# Patient Record
Sex: Female | Born: 1970
Health system: Southern US, Community
[De-identification: ages and names within clinical notes are randomized; demographics above are authoritative.]

## PROBLEM LIST (undated history)

## (undated) DIAGNOSIS — R590 Localized enlarged lymph nodes: Secondary | ICD-10-CM

## (undated) DIAGNOSIS — I10 Essential (primary) hypertension: Secondary | ICD-10-CM

## (undated) DIAGNOSIS — J439 Emphysema, unspecified: Secondary | ICD-10-CM

## (undated) DIAGNOSIS — E119 Type 2 diabetes mellitus without complications: Secondary | ICD-10-CM

## (undated) DIAGNOSIS — R918 Other nonspecific abnormal finding of lung field: Secondary | ICD-10-CM

## (undated) DIAGNOSIS — F329 Major depressive disorder, single episode, unspecified: Secondary | ICD-10-CM

## (undated) DIAGNOSIS — G8929 Other chronic pain: Secondary | ICD-10-CM

## (undated) DIAGNOSIS — F32A Depression, unspecified: Secondary | ICD-10-CM

## (undated) DIAGNOSIS — F17200 Nicotine dependence, unspecified, uncomplicated: Secondary | ICD-10-CM

## (undated) HISTORY — PX: WISDOM TOOTH EXTRACTION: SHX21

## (undated) HISTORY — PX: HIP CLOSED REDUCTION: SHX983

## (undated) HISTORY — PX: CHOLECYSTECTOMY: SHX55

---
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
## 2013-07-20 DIAGNOSIS — K8010 Calculus of gallbladder with chronic cholecystitis without obstruction: Secondary | ICD-10-CM | POA: Insufficient documentation

## 2013-11-20 DIAGNOSIS — M25559 Pain in unspecified hip: Secondary | ICD-10-CM | POA: Insufficient documentation

## 2014-02-08 DIAGNOSIS — S32402A Unspecified fracture of left acetabulum, initial encounter for closed fracture: Secondary | ICD-10-CM | POA: Insufficient documentation

## 2014-06-24 DIAGNOSIS — M1652 Unilateral post-traumatic osteoarthritis, left hip: Secondary | ICD-10-CM | POA: Insufficient documentation

## 2016-04-09 DIAGNOSIS — J189 Pneumonia, unspecified organism: Secondary | ICD-10-CM

## 2016-04-09 HISTORY — DX: Pneumonia, unspecified organism: J18.9

## 2017-12-25 ENCOUNTER — Emergency Department (HOSPITAL_BASED_OUTPATIENT_CLINIC_OR_DEPARTMENT_OTHER): Payer: Self-pay

## 2017-12-25 ENCOUNTER — Other Ambulatory Visit: Payer: Self-pay

## 2017-12-25 ENCOUNTER — Emergency Department (HOSPITAL_BASED_OUTPATIENT_CLINIC_OR_DEPARTMENT_OTHER)
Admission: EM | Admit: 2017-12-25 | Discharge: 2017-12-25 | Disposition: A | Discharge status: Home / Self Care | Payer: Self-pay | Attending: Emergency Medicine | Admitting: Emergency Medicine

## 2017-12-25 ENCOUNTER — Encounter (HOSPITAL_BASED_OUTPATIENT_CLINIC_OR_DEPARTMENT_OTHER): Payer: Self-pay | Admitting: *Deleted

## 2017-12-25 DIAGNOSIS — M25552 Pain in left hip: Secondary | ICD-10-CM | POA: Insufficient documentation

## 2017-12-25 DIAGNOSIS — G8929 Other chronic pain: Secondary | ICD-10-CM | POA: Insufficient documentation

## 2017-12-25 DIAGNOSIS — E119 Type 2 diabetes mellitus without complications: Secondary | ICD-10-CM | POA: Insufficient documentation

## 2017-12-25 DIAGNOSIS — F17210 Nicotine dependence, cigarettes, uncomplicated: Secondary | ICD-10-CM | POA: Insufficient documentation

## 2017-12-25 DIAGNOSIS — M545 Low back pain: Secondary | ICD-10-CM | POA: Insufficient documentation

## 2017-12-25 DIAGNOSIS — Z79899 Other long term (current) drug therapy: Secondary | ICD-10-CM | POA: Insufficient documentation

## 2017-12-25 HISTORY — DX: Type 2 diabetes mellitus without complications: E11.9

## 2017-12-25 HISTORY — DX: Other chronic pain: G89.29

## 2017-12-25 LAB — PREGNANCY, URINE: Preg Test, Ur: NEGATIVE

## 2017-12-25 MED ORDER — TRAMADOL HCL 50 MG PO TABS: 50.0000 mg | ORAL_TABLET | Freq: Two times a day (BID) | ORAL | 0 refills | Status: DC | PRN

## 2017-12-25 MED ORDER — HYDROMORPHONE HCL 1 MG/ML IJ SOLN
1.0000 mg | Freq: Once | INTRAMUSCULAR | Status: AC
  Administered 2017-12-25: 1 mg via INTRAMUSCULAR
  Filled 2017-12-25: qty 1

## 2017-12-25 MED ORDER — HYDROCODONE-ACETAMINOPHEN 5-325 MG PO TABS
2.0000 | ORAL_TABLET | Freq: Once | ORAL | Status: AC
  Administered 2017-12-25: 2 via ORAL
  Filled 2017-12-25: qty 2

## 2017-12-25 MED ORDER — KETOROLAC TROMETHAMINE 60 MG/2ML IM SOLN
60.0000 mg | Freq: Once | INTRAMUSCULAR | Status: AC
  Administered 2017-12-25: 60 mg via INTRAMUSCULAR
  Filled 2017-12-25: qty 2

## 2017-12-25 MED ORDER — FENTANYL CITRATE (PF) 100 MCG/2ML IJ SOLN
50.0000 ug | Freq: Once | INTRAMUSCULAR | Status: AC
  Administered 2017-12-25: 50 ug via INTRAMUSCULAR
  Filled 2017-12-25: qty 2

## 2017-12-25 MED ORDER — METHOCARBAMOL 500 MG PO TABS: 500.0000 mg | ORAL_TABLET | Freq: Two times a day (BID) | ORAL | 0 refills | Status: DC

## 2017-12-25 NOTE — ED Notes (Signed)
Patient transported to CT 

## 2017-12-25 NOTE — Discharge Instructions (Signed)
As discussed, you need to follow-up with referred orthopedic doctor for further evaluation.  You can take Tylenol or Ibuprofen as directed for pain. You can alternate Tylenol and Ibuprofen every 4 hours. If you take Tylenol at 1pm, then you can take Ibuprofen at 5pm. Then you can take Tylenol again at 9pm.   You can take the tramadol for severe breakthrough pain.  Take Robaxin as prescribed. This medication will make you drowsy so do not drive or drink alcohol when taking it.  Return the emergency department for worsening pain, numbness/weakness of the leg, difficulty walking, fevers, redness or swelling of the leg or any other worsening or concerning symptoms.

## 2017-12-25 NOTE — ED Notes (Signed)
ED Provider at bedside. 

## 2017-12-25 NOTE — ED Triage Notes (Signed)
Pt c/o lower left back and hip pain which radiates  down left leg  X 1 day, HX chronic left hip pain
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
## 2017-12-31 ENCOUNTER — Ambulatory Visit (INDEPENDENT_AMBULATORY_CARE_PROVIDER_SITE_OTHER): Payer: Self-pay | Admitting: Family Medicine

## 2017-12-31 ENCOUNTER — Encounter: Payer: Self-pay | Admitting: Family Medicine

## 2017-12-31 VITALS — BP 139/89 | HR 89 | Ht 60.0 in | Wt 150.0 lb

## 2017-12-31 DIAGNOSIS — M79605 Pain in left leg: Secondary | ICD-10-CM

## 2017-12-31 DIAGNOSIS — M545 Low back pain: Secondary | ICD-10-CM

## 2017-12-31 MED ORDER — HYDROCODONE-ACETAMINOPHEN 5-325 MG PO TABS: 1.0000 | ORAL_TABLET | ORAL | 0 refills | Status: DC | PRN

## 2017-12-31 MED ORDER — TIZANIDINE HCL 4 MG PO TABS: 4.0000 mg | ORAL_TABLET | Freq: Four times a day (QID) | ORAL | 1 refills | Status: DC | PRN

## 2017-12-31 MED ORDER — PREDNISONE 10 MG PO TABS: ORAL_TABLET | ORAL | 0 refills | Status: DC

## 2017-12-31 NOTE — Patient Instructions (Signed)
You have lumbar radiculopathy (a pinched nerve in your low back) along with your hip arthritis, nonunion of pelvic fracture A prednisone dose pack is the best option for immediate relief and may be prescribed. Day after finishing prednisone ok to take aleve twice a day with food for pain and inflammation. Norco as needed for severe pain (no driving on this medicine). Tizanidine as needed for muscle spasms (no driving on this medicine if it makes you sleepy). Stay as active as possible. Strengthening of low back muscles, abdominal musculature are key for long term pain relief. If not improving, will consider further imaging (MRI). Follow up with me in 1-2 weeks.

## 2018-01-01 ENCOUNTER — Encounter: Payer: Self-pay | Admitting: Family Medicine
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
## 2018-01-09 ENCOUNTER — Ambulatory Visit: Payer: Self-pay | Admitting: Family Medicine

## 2018-01-10 ENCOUNTER — Ambulatory Visit: Payer: Self-pay | Admitting: Family Medicine

## 2018-04-29 ENCOUNTER — Encounter (HOSPITAL_BASED_OUTPATIENT_CLINIC_OR_DEPARTMENT_OTHER): Payer: Self-pay | Admitting: Emergency Medicine

## 2018-04-29 ENCOUNTER — Other Ambulatory Visit: Payer: Self-pay

## 2018-04-29 ENCOUNTER — Emergency Department (HOSPITAL_BASED_OUTPATIENT_CLINIC_OR_DEPARTMENT_OTHER)
Admission: EM | Admit: 2018-04-29 | Discharge: 2018-04-29 | Disposition: A | Discharge status: Home / Self Care | Payer: Self-pay | Attending: Emergency Medicine | Admitting: Emergency Medicine

## 2018-04-29 ENCOUNTER — Emergency Department (HOSPITAL_BASED_OUTPATIENT_CLINIC_OR_DEPARTMENT_OTHER): Payer: Self-pay

## 2018-04-29 DIAGNOSIS — R05 Cough: Secondary | ICD-10-CM | POA: Insufficient documentation

## 2018-04-29 DIAGNOSIS — E119 Type 2 diabetes mellitus without complications: Secondary | ICD-10-CM | POA: Insufficient documentation

## 2018-04-29 DIAGNOSIS — F17210 Nicotine dependence, cigarettes, uncomplicated: Secondary | ICD-10-CM | POA: Insufficient documentation

## 2018-04-29 DIAGNOSIS — R059 Cough, unspecified: Secondary | ICD-10-CM

## 2018-04-29 DIAGNOSIS — J069 Acute upper respiratory infection, unspecified: Secondary | ICD-10-CM | POA: Insufficient documentation

## 2018-04-29 MED ORDER — BENZONATATE 100 MG PO CAPS: 100.0000 mg | ORAL_CAPSULE | Freq: Three times a day (TID) | ORAL | 0 refills | Status: DC | PRN

## 2018-04-29 MED ORDER — ALBUTEROL SULFATE HFA 108 (90 BASE) MCG/ACT IN AERS
2.0000 | INHALATION_SPRAY | Freq: Once | RESPIRATORY_TRACT | Status: AC
  Administered 2018-04-29: 2 via RESPIRATORY_TRACT
  Filled 2018-04-29: qty 6.7

## 2018-04-29 MED ORDER — DOXYCYCLINE HYCLATE 100 MG PO CAPS: 100.0000 mg | ORAL_CAPSULE | Freq: Two times a day (BID) | ORAL | 0 refills | Status: AC

## 2018-04-29 MED FILL — BENZONATATE 100 MG CAP: 100 | 7 days supply | Qty: 21 | Fill #0

## 2018-04-29 MED FILL — DOXYCYCLINE HYCLATE 100 MG: 100 | 7 days supply | Qty: 14 | Fill #0

## 2018-04-29 NOTE — ED Provider Notes (Signed)
Emergency Department Provider Note   I have reviewed the triage vital signs and the nursing notes.   HISTORY  Chief Complaint Generalized Body Aches and Cough   HPI Marcia King is a 48 y.o. female with PMH of DM and chronic left hip pain presents to the emergency department for evaluation of cough, congestion, headache, body aches over the past 9 days.  Patient states that symptoms began as relatively mild and have progressively worsened over the past several days.  She denies any vomiting or diarrhea.  She has had persistent cough and states that the cough is causing pain in her left hip, which always hurts.  She denies any fever.  She did start taking a friend's amoxicillin over the past 4 to 5 days.  She is a smoker but has not been able to smoke over the past week due to symptoms.  No abdominal pain.  No radiation of symptoms or other modifying factors.  Past Medical History:  Diagnosis Date  . Chronic pain   . Diabetes mellitus without complication [facility])     Patient Active Problem List   Diagnosis Date Noted  . Post-traumatic osteoarthritis of left hip 06/24/2014  . Closed fracture of left acetabulum ([place]) 02/08/2014  . Arthralgia of hip 11/20/2013  . Chronic cholecystitis with calculus 07/20/2013    Past Surgical History:  Procedure Laterality Date  . CHOLECYSTECTOMY    . HIP CLOSED REDUCTION      Allergies Patient has no known allergies.  History reviewed. No pertinent family history.  Social History Social History   Tobacco Use  . Smoking status: Current Every Day Smoker    Packs/day: 0.50    Types: Cigarettes  . Smokeless tobacco: Never Used  Substance Use Topics  . Alcohol use: Not Currently  . Drug use: Not Currently    Review of Systems  Constitutional: No fever/chills. Positive body aches.  Eyes: No visual changes. ENT: No sore throat. Positive congestion.  Cardiovascular: Denies chest pain. Respiratory: Positive shortness of breath and  cough.  Gastrointestinal: No abdominal pain.  No nausea, no vomiting.  No diarrhea.  No constipation. Genitourinary: Negative for dysuria. Musculoskeletal: Negative for back pain. Skin: Negative for rash. Neurological: Positive mild HA.   10-point ROS otherwise negative.  ____________________________________________   PHYSICAL EXAM:  VITAL SIGNS: ED Triage Vitals  Enc Vitals Group     BP 04/29/18 0804 (!) 166/107     Pulse Rate 04/29/18 0804 (!) 105     Resp 04/29/18 0804 20     Temp 04/29/18 0804 98.7 F (37.1 C)     Temp Source 04/29/18 0804 Oral     SpO2 04/29/18 0804 95 %     Weight 04/29/18 0805 150 lb (68 kg)     Height 04/29/18 0805 5' (1.524 m)     Pain Score 04/29/18 0804 7   Constitutional: Alert and oriented. Well appearing and in no acute distress. Eyes: Conjunctivae are normal. Head: Atraumatic. Ears:  Healthy appearing ear canals and TMs bilaterally Nose: No congestion/rhinnorhea. Mouth/Throat: Mucous membranes are moist.  Oropharynx non-erythematous. Neck: No stridor.   Cardiovascular: Tachycardia. Good peripheral circulation. Grossly normal heart sounds.   Respiratory: Normal respiratory effort.  No retractions. Lungs CTAB. Intermittent bronchospastic cough noted.  Gastrointestinal: No distention.  Musculoskeletal: No lower extremity tenderness nor edema. Neurologic:  Normal speech and language.  Skin:  Skin is warm, dry and intact.   ____________________________________________  RADIOLOGY  Dg Chest 2 View  Result Date: 04/29/2018  CLINICAL DATA:  Cough, headache EXAM: CHEST - 2 VIEW COMPARISON:  None. FINDINGS: Increased interstitial markings in the right upper lobe, lingula, and bilateral lower lobes. Subpleural/basilar predominance. Although chronic interstitial lung disease is possible, multifocal infection can not be excluded in the absence of prior imaging. No pleural effusion or pneumothorax. The heart is normal in size. Visualized osseous  structures are within normal limits. 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 Acute infection/pneumonia is not excluded. Electronically Signed   By: Julian Hy M.D.   On: 04/29/2018 08:37    ____________________________________________   PROCEDURES  Procedure(s) performed:   Procedures  None ____________________________________________   INITIAL IMPRESSION / ASSESSMENT AND PLAN / ED COURSE  Pertinent labs & imaging results that were available during my care of the patient were reviewed by me and considered in my medical decision making (see chart for details).  Patient presents to the emergency department with flulike symptoms over the past 9 days.  She has been self treating at home with a friend's amoxicillin over the past 4 to 5 days.  No hypoxemia or focal lung findings.  She does have a bronchospastic cough and long smoking history.  Low suspicion for pneumonia but given 9 days of symptoms I do plan for chest x-ray.  Afebrile here.  Frequently coughing.  Plan for albuterol inhaler for home use.  Advised against continuing her empiric amoxicillin.  I also counseled the patient on smoking cessation as she has not smoked in the past week.  Patient will consider cutting back or quitting altogether.   08:40 AM Chest x-ray reviewed with normal infiltrate at the bases.  Could be related to longstanding smoking but in the setting of flulike symptoms I will cover with Doxy for possible pneumonia.  Patient provided an albuterol inhaler in the emergency department.  Also prescribed Tessalon for cough suppression.  Contact information for primary care physician provided.  [facility] emergency department return precautions. ____________________________________________  FINAL CLINICAL IMPRESSION(S) / ED DIAGNOSES  Final diagnoses:  Upper respiratory tract infection, unspecified type  Cough     MEDICATIONS GIVEN DURING THIS VISIT:  Medications    albuterol (PROVENTIL HFA;VENTOLIN HFA) 108 (90 Base) MCG/ACT inhaler 2 puff (2 puffs Inhalation Given 04/29/18 0818)     NEW OUTPATIENT MEDICATIONS STARTED DURING THIS VISIT:  New Prescriptions   BENZONATATE (TESSALON) 100 MG CAPSULE    Take 1 capsule (100 mg total) by mouth 3 (three) times daily as needed for cough.   DOXYCYCLINE (VIBRAMYCIN) 100 MG CAPSULE    Take 1 capsule (100 mg total) by mouth 2 (two) times daily for 7 days.    Note:  This document was prepared using Dragon voice recognition software and may include unintentional dictation errors.  Nanda Quinton, MD Emergency Medicine    Long, Wonda Olds, MD 04/29/18 [phone]

## 2018-04-29 NOTE — ED Triage Notes (Signed)
Reports cough since last Tuesday.  Additionally endorses generalized body aches.  Reports taking a previous RX she had for amoxicillin since Thursday.

## 2018-04-29 NOTE — Discharge Instructions (Signed)
We believe that your symptoms are caused today by pneumonia, an infection in your lung(s).  Fortunately you should start to improve quickly after taking your antibiotics.  Please take the full course of antibiotics as prescribed and drink plenty of fluids.    Follow up with your doctor within 1-2 days.  If you develop any new or worsening symptoms, including but not limited to fever in spite of taking over-the-counter ibuprofen and/or Tylenol, persistent vomiting, worsening shortness of breath, or other symptoms that concern you, please return to the Emergency Department immediately.

## 2018-05-21 ENCOUNTER — Other Ambulatory Visit: Payer: Self-pay

## 2018-05-21 ENCOUNTER — Emergency Department (HOSPITAL_BASED_OUTPATIENT_CLINIC_OR_DEPARTMENT_OTHER): Payer: Self-pay

## 2018-05-21 ENCOUNTER — Encounter (HOSPITAL_BASED_OUTPATIENT_CLINIC_OR_DEPARTMENT_OTHER): Payer: Self-pay | Admitting: Emergency Medicine

## 2018-05-21 ENCOUNTER — Emergency Department (HOSPITAL_BASED_OUTPATIENT_CLINIC_OR_DEPARTMENT_OTHER)
Admission: EM | Admit: 2018-05-21 | Discharge: 2018-05-21 | Disposition: A | Discharge status: Home / Self Care | Payer: Self-pay | Attending: Emergency Medicine | Admitting: Emergency Medicine

## 2018-05-21 DIAGNOSIS — Z79899 Other long term (current) drug therapy: Secondary | ICD-10-CM | POA: Insufficient documentation

## 2018-05-21 DIAGNOSIS — N3000 Acute cystitis without hematuria: Secondary | ICD-10-CM | POA: Insufficient documentation

## 2018-05-21 DIAGNOSIS — F17210 Nicotine dependence, cigarettes, uncomplicated: Secondary | ICD-10-CM | POA: Insufficient documentation

## 2018-05-21 DIAGNOSIS — J069 Acute upper respiratory infection, unspecified: Secondary | ICD-10-CM | POA: Insufficient documentation

## 2018-05-21 DIAGNOSIS — R059 Cough, unspecified: Secondary | ICD-10-CM

## 2018-05-21 DIAGNOSIS — E119 Type 2 diabetes mellitus without complications: Secondary | ICD-10-CM | POA: Insufficient documentation

## 2018-05-21 DIAGNOSIS — R05 Cough: Secondary | ICD-10-CM

## 2018-05-21 DIAGNOSIS — R52 Pain, unspecified: Secondary | ICD-10-CM

## 2018-05-21 DIAGNOSIS — R5383 Other fatigue: Secondary | ICD-10-CM | POA: Insufficient documentation

## 2018-05-21 DIAGNOSIS — R509 Fever, unspecified: Secondary | ICD-10-CM | POA: Insufficient documentation

## 2018-05-21 DIAGNOSIS — R0602 Shortness of breath: Secondary | ICD-10-CM | POA: Insufficient documentation

## 2018-05-21 LAB — CBC WITH DIFFERENTIAL/PLATELET
Abs Immature Granulocytes: 0.01 10*3/uL (ref 0.00–0.07)
BASOS ABS: 0 10*3/uL (ref 0.0–0.1)
Basophils Relative: 1 %
EOS PCT: 4 %
Eosinophils Absolute: 0.1 10*3/uL (ref 0.0–0.5)
HEMATOCRIT: 44.8 % (ref 36.0–46.0)
HEMOGLOBIN: 14.8 g/dL (ref 12.0–15.0)
Immature Granulocytes: 0 %
LYMPHS PCT: 36 %
Lymphs Abs: 1.2 10*3/uL (ref 0.7–4.0)
MCH: 28.7 pg (ref 26.0–34.0)
MCHC: 33 g/dL (ref 30.0–36.0)
MCV: 86.8 fL (ref 80.0–100.0)
MONO ABS: 0.3 10*3/uL (ref 0.1–1.0)
Monocytes Relative: 8 %
NRBC: 0 % (ref 0.0–0.2)
Neutro Abs: 1.7 10*3/uL (ref 1.7–7.7)
Neutrophils Relative %: 51 %
PLATELETS: 194 10*3/uL (ref 150–400)
RBC: 5.16 MIL/uL — AB (ref 3.87–5.11)
RDW: 13.1 % (ref 11.5–15.5)
WBC: 3.3 10*3/uL — AB (ref 4.0–10.5)

## 2018-05-21 LAB — COMPREHENSIVE METABOLIC PANEL
ALT: 63 U/L — AB (ref 0–44)
AST: 57 U/L — AB (ref 15–41)
Albumin: 3.9 g/dL (ref 3.5–5.0)
Alkaline Phosphatase: 73 U/L (ref 38–126)
Anion gap: 8 (ref 5–15)
BUN: 15 mg/dL (ref 6–20)
CHLORIDE: 107 mmol/L (ref 98–111)
CO2: 22 mmol/L (ref 22–32)
CREATININE: 0.79 mg/dL (ref 0.44–1.00)
Calcium: 9.1 mg/dL (ref 8.9–10.3)
GFR calc Af Amer: >60 mL/min (ref >60)
GLUCOSE: 98 mg/dL (ref 70–99)
Potassium: 3.8 mmol/L (ref 3.5–5.1)
Sodium: 137 mmol/L (ref 135–145)
Total Bilirubin: 0.3 mg/dL (ref 0.3–1.2)
Total Protein: 7.9 g/dL (ref 6.5–8.1)

## 2018-05-21 LAB — URINALYSIS, ROUTINE W REFLEX MICROSCOPIC
Bilirubin Urine: NEGATIVE
GLUCOSE, UA: NEGATIVE mg/dL
HGB URINE DIPSTICK: NEGATIVE
Ketones, ur: NEGATIVE mg/dL
Nitrite: NEGATIVE
PH: 6 (ref 5.0–8.0)
Protein, ur: NEGATIVE mg/dL
SPECIFIC GRAVITY, URINE: 1.025 (ref 1.005–1.030)

## 2018-05-21 LAB — BRAIN NATRIURETIC PEPTIDE: B Natriuretic Peptide: 12 pg/mL (ref 0.0–100.0)

## 2018-05-21 LAB — TROPONIN I: Troponin I: <0.03 ng/mL (ref <0.03)

## 2018-05-21 LAB — URINALYSIS, MICROSCOPIC (REFLEX)

## 2018-05-21 LAB — LIPASE, BLOOD: LIPASE: 36 U/L (ref 11–51)

## 2018-05-21 MED ORDER — IPRATROPIUM-ALBUTEROL 0.5-2.5 (3) MG/3ML IN SOLN
3.0000 mL | Freq: Four times a day (QID) | RESPIRATORY_TRACT | Status: DC
  Administered 2018-05-21: 3 mL via RESPIRATORY_TRACT
  Filled 2018-05-21: qty 3

## 2018-05-21 MED ORDER — CEPHALEXIN 250 MG PO CAPS
500.0000 mg | ORAL_CAPSULE | Freq: Once | ORAL | Status: AC
  Administered 2018-05-21: 500 mg via ORAL
  Filled 2018-05-21: qty 2

## 2018-05-21 MED ORDER — BENZONATATE 100 MG PO CAPS: 100.0000 mg | ORAL_CAPSULE | Freq: Three times a day (TID) | ORAL | 0 refills | Status: DC

## 2018-05-21 MED ORDER — BENZONATATE 100 MG PO CAPS
100.0000 mg | ORAL_CAPSULE | Freq: Once | ORAL | Status: AC
  Administered 2018-05-21: 100 mg via ORAL
  Filled 2018-05-21: qty 1

## 2018-05-21 MED ORDER — CEPHALEXIN 500 MG PO CAPS: 500.0000 mg | ORAL_CAPSULE | Freq: Two times a day (BID) | ORAL | 0 refills | Status: AC

## 2018-05-21 MED ORDER — PREDNISONE 50 MG PO TABS: 50.0000 mg | ORAL_TABLET | Freq: Every day | ORAL | 0 refills | Status: AC

## 2018-05-21 MED ORDER — SODIUM CHLORIDE 0.9 % IV BOLUS
1000.0000 mL | Freq: Once | INTRAVENOUS | Status: AC
  Administered 2018-05-21: 1000 mL via INTRAVENOUS

## 2018-05-21 MED ORDER — PREDNISONE 50 MG PO TABS
50.0000 mg | ORAL_TABLET | Freq: Once | ORAL | Status: AC
  Administered 2018-05-21: 50 mg via ORAL
  Filled 2018-05-21: qty 1

## 2018-05-21 MED FILL — predniSONE 50 MG TABS: 50 | 4 days supply | Qty: 4 | Fill #0

## 2018-05-21 MED FILL — BENZONATATE 100 MG CAP: 100 | 7 days supply | Qty: 21 | Fill #0

## 2018-05-21 MED FILL — CEPHALEXIN 500 MG CAPSULE: 500 | 7 days supply | Qty: 14 | Fill #0

## 2018-05-21 NOTE — ED Triage Notes (Signed)
Reports continued generalized body aches, weakness, and fatigue.  Reports fevers.  Additionally c/o productive cough.

## 2018-05-21 NOTE — Discharge Instructions (Signed)
Your work-up today showed no evidence of pneumonia but we did find evidence of urinary tract infection.  Please use the antibiotics for the next week to treat this.  Please use the cough medicine and steroids to help with your breathing.  Please use the albuterol inhaler you are given last time.  Please follow-up with your primary doctor in the neck several days.  If any symptoms change or worsen, please return to the nearest emergency department.

## 2018-05-21 NOTE — ED Provider Notes (Signed)
[facility] EMERGENCY DEPARTMENT Provider Note   CSN: [id number] Arrival date & time: 05/21/18  3710     History   Chief Complaint Chief Complaint  Patient presents with  . Generalized Body Aches    HPI Marcia King is a 48 y.o. female.  The history is provided by the patient and medical records. No language interpreter was used.  URI  Presenting symptoms: congestion, cough, fatigue, fever and rhinorrhea   Severity:  Moderate Onset quality:  Gradual Duration:  6 days Timing:  Intermittent Progression:  Waxing and waning Chronicity:  Recurrent Relieved by:  Nothing Worsened by:  Nothing Ineffective treatments:  OTC medications Associated symptoms: wheezing   Associated symptoms: no neck pain   Risk factors: diabetes mellitus     Past Medical History:  Diagnosis Date  . Chronic pain   . Diabetes mellitus without complication [facility])     Patient Active Problem List   Diagnosis Date Noted  . Post-traumatic osteoarthritis of left hip 06/24/2014  . Closed fracture of left acetabulum ([place]) 02/08/2014  . Arthralgia of hip 11/20/2013  . Chronic cholecystitis with calculus 07/20/2013    Past Surgical History:  Procedure Laterality Date  . CHOLECYSTECTOMY    . HIP CLOSED REDUCTION       OB History   No obstetric history on file.      Home Medications    Prior to Admission medications   Medication Sig Start Date End Date Taking? Authorizing Provider  albuterol (PROVENTIL HFA;VENTOLIN HFA) 108 (90 Base) MCG/ACT inhaler Inhale into the lungs. 12/27/16   [provider]  benzonatate (TESSALON) 100 MG capsule Take 1 capsule (100 mg total) by mouth 3 (three) times daily as needed for cough. 04/29/18   Long, Wonda Olds, MD  Calcium Carb-Cholecalciferol (CALCIUM-VITAMIN D) 500-200 MG-UNIT tablet Take by mouth.    [provider]  HYDROcodone-acetaminophen (NORCO) 5-325 MG tablet Take 1 tablet by mouth every 4 (four) hours as needed for moderate  pain. 12/31/17   Hudnall, Sharyn Lull, MD  predniSONE (DELTASONE) 10 MG tablet 6 tabs po day 1, 5 tabs po day 2, 4 tabs po day 3, 3 tabs po day 4, 2 tabs po day 5, 1 tab po day 6 12/31/17   Hudnall, Sharyn Lull, MD  tiZANidine (ZANAFLEX) 4 MG tablet Take 1 tablet (4 mg total) by mouth every 6 (six) hours as needed. 12/31/17   Dene Gentry, MD    Family History History reviewed. No pertinent family history.  Social History Social History   Tobacco Use  . Smoking status: Current Every Day Smoker    Packs/day: 0.50    Types: Cigarettes  . Smokeless tobacco: Never Used  Substance Use Topics  . Alcohol use: Not Currently  . Drug use: Not Currently     Allergies   Patient has no known allergies.   Review of Systems Review of Systems  Constitutional: Positive for chills, fatigue and fever. Negative for diaphoresis.  HENT: Positive for congestion and rhinorrhea.   Eyes: Negative for visual disturbance.  Respiratory: Positive for cough, shortness of breath and wheezing. Negative for chest tightness.   Cardiovascular: Negative for chest pain, palpitations and leg swelling.  Gastrointestinal: Positive for diarrhea. Negative for abdominal pain, constipation, nausea and vomiting.  Genitourinary: Negative for dysuria, flank pain and frequency.  Musculoskeletal: Negative for back pain, neck pain and neck stiffness.  Skin: Negative for rash and wound.  Neurological: Negative for dizziness, weakness, light-headedness and numbness.  Psychiatric/Behavioral: Negative  for agitation and confusion.  All other systems reviewed and are negative.    Physical Exam Updated Vital Signs BP (!) 150/107 (BP Location: Right Arm)   Pulse (!) 110   Temp 98.4 F (36.9 C) (Oral)   Resp 18   Ht 5' (1.524 m)   Wt 68 kg   SpO2 93%   BMI 29.29 kg/m   Physical Exam Vitals signs and nursing note reviewed.  Constitutional:      General: She is not in acute distress.    Appearance: She is well-developed. She  is not ill-appearing, toxic-appearing or diaphoretic.  HENT:     Head: Normocephalic and atraumatic.     Nose: Congestion and rhinorrhea present.     Mouth/Throat:     Mouth: Mucous membranes are dry.     Pharynx: No oropharyngeal exudate or posterior oropharyngeal erythema.  Eyes:     Extraocular Movements: Extraocular movements intact.     Conjunctiva/sclera: Conjunctivae normal.     Pupils: Pupils are equal, round, and reactive to light.  Neck:     Musculoskeletal: Neck supple.  Cardiovascular:     Rate and Rhythm: Regular rhythm. Tachycardia present.     Pulses: Normal pulses.     Heart sounds: No murmur.  Pulmonary:     Effort: Pulmonary effort is normal. No respiratory distress.     Breath sounds: No stridor. Wheezing, rhonchi and rales present.  Chest:     Chest wall: Tenderness present.  Abdominal:     General: Abdomen is flat. There is no distension.     Palpations: Abdomen is soft.     Tenderness: There is no abdominal tenderness. There is no right CVA tenderness, left CVA tenderness or guarding.  Musculoskeletal:        General: No tenderness.     Right lower leg: No edema.     Left lower leg: No edema.  Skin:    General: Skin is warm and dry.     Capillary Refill: Capillary refill takes less than 2 seconds.     Findings: No erythema.  Neurological:     General: No focal deficit present.     Mental Status: She is alert.  Psychiatric:        Mood and Affect: Mood normal.      ED Treatments / Results  Labs (all labs ordered are listed, but only abnormal results are displayed) Labs Reviewed  CBC WITH DIFFERENTIAL/PLATELET - Abnormal; Notable for the following components:      Result Value   WBC 3.3 (*)    RBC 5.16 (*)    All other components within normal limits  COMPREHENSIVE METABOLIC PANEL - Abnormal; Notable for the following components:   AST 57 (*)    ALT 63 (*)    All other components within normal limits  URINALYSIS, ROUTINE W REFLEX MICROSCOPIC  - Abnormal; Notable for the following components:   APPearance CLOUDY (*)    Leukocytes,Ua SMALL (*)    All other components within normal limits  URINALYSIS, MICROSCOPIC (REFLEX) - Abnormal; Notable for the following components:   Bacteria, UA MANY (*)    All other components within normal limits  URINE CULTURE  LIPASE, BLOOD  BRAIN NATRIURETIC PEPTIDE  TROPONIN I    EKG EKG Interpretation  Date/Time:  Wednesday May 21 2018 08:19:15 EST Ventricular Rate:  93 PR Interval:    QRS Duration: 85 QT Interval:  368 QTC Calculation: 458 R Axis:   -10 Text Interpretation:  Sinus  rhythm No prior ECG for comparison.  No STEMI Confirmed by Antony Blackbird [phone]) on 05/21/2018 8:27:39 AM Also confirmed by Antony Blackbird [phone]), editor Philomena Doheny [phone])  on 05/21/2018 11:36:44 AM   Radiology Dg Chest 2 View  Result Date: 05/21/2018 CLINICAL DATA:  Cough EXAM: CHEST - 2 VIEW COMPARISON:  April 29, 2018 FINDINGS: There are areas of scarring in each mid lung region and to a lesser degree in the right upper lobe. There is no appreciable edema or consolidation. The heart size and pulmonary vascularity are normal. No adenopathy. No bone lesions. IMPRESSION: Scattered areas of scarring. No edema or consolidation. Stable cardiac silhouette. No evident adenopathy. Electronically Signed   By: Lowella Grip III M.D.   On: 05/21/2018 08:31    Procedures Procedures (including critical care time)  Medications Ordered in ED Medications  ipratropium-albuterol (DUONEB) 0.5-2.5 (3) MG/3ML nebulizer solution 3 mL (3 mLs Nebulization Given 05/21/18 0821)  cephALEXin (KEFLEX) capsule 500 mg (has no administration in time range)  benzonatate (TESSALON) capsule 100 mg (has no administration in time range)  predniSONE (DELTASONE) tablet 50 mg (has no administration in time range)  sodium chloride 0.9 % bolus 1,000 mL (0 mLs Intravenous Stopped 05/21/18 0915)     Initial Impression / Assessment  and Plan / ED Course  I have reviewed the triage vital signs and the nursing notes.  Pertinent labs & imaging results that were available during my care of the patient were reviewed by me and considered in my medical decision making (see chart for details).     Marcia King is a 47 y.o. female with a past medical history significant for chronic left hip pain after hip replacement, diabetes, prior cholecystectomy, and recent URI who presents with fevers, chills, congestion, productive cough, myalgias, diarrhea, and fatigue.  Patient reports that last month she had a viral URI and had several days where she was doing better but then last Friday started having worsened symptoms.  She reports her temperature was 101 on Friday and has had intermittent fevers and chills since.  She reports she is having cough and is having shortness of breath.  She denies significant chest pain.  She denies abdominal pain.  She does report diffuse myalgias.  She reports no leg pain or leg swelling specifically.  She reports no urinary symptoms but does report onset of diarrhea.  She denies any blood in her diarrhea.  She denies any neck pain or neck stiffness specifically.  No neurologic deficits.  She denies other complaints.  On exam, patient has rhonchi and crackles in the bases of her lungs.  She also has some mild diffuse wheezing.  She will be given a breathing treatment.  Chest was tender.  Abdomen was nontender.  Normal sensation in extremities.  Normal pulses in extremities.  No significant lower extremity edema seen.  Patient is tachycardic on arrival.  Likely aspect patient is slightly dehydrated from reported decreased appetite and her diarrhea.  Patient also likely has viral infection given her diffuse symptoms.  However, due to the crackles in her lungs, will have chest x-ray to look for pneumonia and a BNP look for fluid overload.  We will also obtain troponin to look for some form of viral myocarditis causing  the crackles.  Patient will be given fluids and a breathing treatment.  Anticipate reassessment after work-up and medications.  12:09 PM Work-up returned overall reassuring.  Troponin negative, doubt myocarditis.  Urinalysis does have leukocytes and bacteria.  Patient reports  that she is having decreased urine which she was not presenting with earlier.  Patient was treated for UTI.  Patient requested a cough medicine only on Tessalon.  She had improvement in her wheezing but was still having small wheezing.  Suspect undiagnosed underlying asthma or COPD.  Patient given a burst of steroids with a dose of prednisone today.  Patient agreed with PCP follow-up and return precautions.  X-ray did not show pneumonia.  Patient understands plan of care and discharged in good condition with likely viral URI as well as UTI.     Final Clinical Impressions(s) / ED Diagnoses   Final diagnoses:  Viral upper respiratory tract infection  Cough  Body aches  Fatigue, unspecified type  Acute cystitis without hematuria    ED Discharge Orders         Ordered    predniSONE (DELTASONE) 50 MG tablet  Daily     05/21/18 1212    benzonatate (TESSALON) 100 MG capsule  Every 8 hours     05/21/18 1212    cephALEXin (KEFLEX) 500 MG capsule  2 times daily     05/21/18 1212          Clinical Impression: 1. Viral upper respiratory tract infection   2. Cough   3. Body aches   4. Fatigue, unspecified type   5. Acute cystitis without hematuria     Disposition: Discharge  Condition: Good  I have discussed the results, Dx and Tx plan with the pt(& family if present). He/she/they expressed understanding and agree(s) with the plan. Discharge instructions discussed at great length. Strict return precautions discussed and pt &/or family have verbalized understanding of the instructions. No further questions at time of discharge.    New Prescriptions   BENZONATATE (TESSALON) 100 MG CAPSULE    Take 1 capsule (100  mg total) by mouth every 8 (eight) hours.   CEPHALEXIN (KEFLEX) 500 MG CAPSULE    Take 1 capsule (500 mg total) by mouth 2 (two) times daily for 7 days.   PREDNISONE (DELTASONE) 50 MG TABLET    Take 1 tablet (50 mg total) by mouth daily for 4 days.    Follow Up: [facility] [place] [id number] [phone] Schedule an appointment as soon as possible for a visit    [facility] [address] [id number] [place] [phone]       Tegeler, Gwenyth Allegra, MD 05/21/18 1214

## 2018-05-22 LAB — URINE CULTURE: CULTURE: NO GROWTH

## 2018-09-08 ENCOUNTER — Encounter (HOSPITAL_BASED_OUTPATIENT_CLINIC_OR_DEPARTMENT_OTHER): Payer: Self-pay | Admitting: *Deleted

## 2018-09-08 ENCOUNTER — Emergency Department (HOSPITAL_BASED_OUTPATIENT_CLINIC_OR_DEPARTMENT_OTHER): Payer: Self-pay

## 2018-09-08 ENCOUNTER — Other Ambulatory Visit: Payer: Self-pay

## 2018-09-08 ENCOUNTER — Emergency Department (HOSPITAL_BASED_OUTPATIENT_CLINIC_OR_DEPARTMENT_OTHER)
Admission: EM | Admit: 2018-09-08 | Discharge: 2018-09-08 | Disposition: A | Discharge status: Home / Self Care | Payer: Self-pay | Attending: Emergency Medicine | Admitting: Emergency Medicine

## 2018-09-08 DIAGNOSIS — E119 Type 2 diabetes mellitus without complications: Secondary | ICD-10-CM | POA: Insufficient documentation

## 2018-09-08 DIAGNOSIS — S20211A Contusion of right front wall of thorax, initial encounter: Secondary | ICD-10-CM | POA: Insufficient documentation

## 2018-09-08 DIAGNOSIS — Y92009 Unspecified place in unspecified non-institutional (private) residence as the place of occurrence of the external cause: Secondary | ICD-10-CM | POA: Insufficient documentation

## 2018-09-08 DIAGNOSIS — W19XXXA Unspecified fall, initial encounter: Secondary | ICD-10-CM

## 2018-09-08 DIAGNOSIS — F17210 Nicotine dependence, cigarettes, uncomplicated: Secondary | ICD-10-CM | POA: Insufficient documentation

## 2018-09-08 DIAGNOSIS — W010XXA Fall on same level from slipping, tripping and stumbling without subsequent striking against object, initial encounter: Secondary | ICD-10-CM | POA: Insufficient documentation

## 2018-09-08 DIAGNOSIS — Y9389 Activity, other specified: Secondary | ICD-10-CM | POA: Insufficient documentation

## 2018-09-08 DIAGNOSIS — Z79899 Other long term (current) drug therapy: Secondary | ICD-10-CM | POA: Insufficient documentation

## 2018-09-08 DIAGNOSIS — S20212A Contusion of left front wall of thorax, initial encounter: Secondary | ICD-10-CM

## 2018-09-08 DIAGNOSIS — S7002XA Contusion of left hip, initial encounter: Secondary | ICD-10-CM | POA: Insufficient documentation

## 2018-09-08 DIAGNOSIS — Y999 Unspecified external cause status: Secondary | ICD-10-CM | POA: Insufficient documentation

## 2018-09-08 MED ORDER — HYDROCODONE-ACETAMINOPHEN 5-325 MG PO TABS
2.0000 | ORAL_TABLET | Freq: Once | ORAL | Status: AC
  Administered 2018-09-08: 2 via ORAL
  Filled 2018-09-08: qty 2

## 2018-09-08 MED ORDER — HYDROCODONE-ACETAMINOPHEN 5-325 MG PO TABS: 1.0000 | ORAL_TABLET | Freq: Four times a day (QID) | ORAL | 0 refills | Status: DC | PRN

## 2018-09-08 NOTE — ED Notes (Signed)
ED Provider at bedside. 
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
## 2018-09-08 NOTE — ED Notes (Signed)
Patient transported to X-ray 
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
## 2018-09-08 NOTE — Discharge Instructions (Signed)
In addition to pain meds you can take ibuprofen as well.

## 2018-10-22 ENCOUNTER — Emergency Department (HOSPITAL_BASED_OUTPATIENT_CLINIC_OR_DEPARTMENT_OTHER)
Admission: EM | Admit: 2018-10-22 | Discharge: 2018-10-22 | Disposition: A | Discharge status: Home / Self Care | Payer: Self-pay | Attending: Emergency Medicine | Admitting: Emergency Medicine

## 2018-10-22 ENCOUNTER — Encounter (HOSPITAL_BASED_OUTPATIENT_CLINIC_OR_DEPARTMENT_OTHER): Payer: Self-pay

## 2018-10-22 ENCOUNTER — Emergency Department (HOSPITAL_BASED_OUTPATIENT_CLINIC_OR_DEPARTMENT_OTHER): Payer: Self-pay

## 2018-10-22 ENCOUNTER — Other Ambulatory Visit: Payer: Self-pay

## 2018-10-22 DIAGNOSIS — S8392XA Sprain of unspecified site of left knee, initial encounter: Secondary | ICD-10-CM | POA: Insufficient documentation

## 2018-10-22 DIAGNOSIS — E119 Type 2 diabetes mellitus without complications: Secondary | ICD-10-CM | POA: Insufficient documentation

## 2018-10-22 DIAGNOSIS — X500XXA Overexertion from strenuous movement or load, initial encounter: Secondary | ICD-10-CM | POA: Insufficient documentation

## 2018-10-22 DIAGNOSIS — Y9389 Activity, other specified: Secondary | ICD-10-CM | POA: Insufficient documentation

## 2018-10-22 DIAGNOSIS — Z79899 Other long term (current) drug therapy: Secondary | ICD-10-CM | POA: Insufficient documentation

## 2018-10-22 DIAGNOSIS — Y999 Unspecified external cause status: Secondary | ICD-10-CM | POA: Insufficient documentation

## 2018-10-22 DIAGNOSIS — F17210 Nicotine dependence, cigarettes, uncomplicated: Secondary | ICD-10-CM | POA: Insufficient documentation

## 2018-10-22 DIAGNOSIS — Y929 Unspecified place or not applicable: Secondary | ICD-10-CM | POA: Insufficient documentation

## 2018-10-22 MED ORDER — IBUPROFEN 800 MG PO TABS: 800.0000 mg | ORAL_TABLET | Freq: Three times a day (TID) | ORAL | 0 refills | Status: AC

## 2018-10-22 MED ORDER — ACETAMINOPHEN 325 MG PO TABS
650.0000 mg | ORAL_TABLET | Freq: Once | ORAL | Status: AC
  Administered 2018-10-22: 650 mg via ORAL
  Filled 2018-10-22: qty 2

## 2018-10-22 MED ORDER — IBUPROFEN 800 MG PO TABS
800.0000 mg | ORAL_TABLET | Freq: Once | ORAL | Status: AC
  Administered 2018-10-22: 21:00:00 800 mg via ORAL
  Filled 2018-10-22: qty 1

## 2018-10-22 NOTE — Discharge Instructions (Signed)
Take Motrin and Tylenol as needed as directed. Use immobilizer and crutches, weight bear as tolerated. Apply ice and elevate knee for 30 minutes three times daily.  Follow up with orthopedics, referral given.

## 2018-10-22 NOTE — ED Provider Notes (Signed)
[facility] EMERGENCY DEPARTMENT Provider Note   CSN: [id number] Arrival date & time: 10/22/18  1914    History   Chief Complaint Chief Complaint  Patient presents with  . Knee Pain    HPI Marcia King is a 48 y.o. female.     48 year old female presents with complaint of left knee injury.  Patient states that she was walking up the stairs when her knee twisted and buckled.  Patient reports pain to the anterior left knee, unable to flex her knee secondary to pain.  Patient noticed bruising to the front of her knee which concerned her and prompted her visit today.  Patient denies any prior injuries to her knee, denies prior locking or clicking.  Patient is unable to bear weight, has crutches with her today.  No other injuries, complaints or concerns.     Past Medical History:  Diagnosis Date  . Chronic pain   . Diabetes mellitus without complication [facility])     Patient Active Problem List   Diagnosis Date Noted  . Post-traumatic osteoarthritis of left hip 06/24/2014  . Closed fracture of left acetabulum ([place]) 02/08/2014  . Arthralgia of hip 11/20/2013  . Chronic cholecystitis with calculus 07/20/2013    Past Surgical History:  Procedure Laterality Date  . CHOLECYSTECTOMY    . HIP CLOSED REDUCTION       OB History   No obstetric history on file.      Home Medications    Prior to Admission medications   Medication Sig Start Date End Date Taking? Authorizing Provider  albuterol (PROVENTIL HFA;VENTOLIN HFA) 108 (90 Base) MCG/ACT inhaler Inhale into the lungs. 12/27/16   [provider]  benzonatate (TESSALON) 100 MG capsule Take 1 capsule (100 mg total) by mouth 3 (three) times daily as needed for cough. 04/29/18   Long, Wonda Olds, MD  benzonatate (TESSALON) 100 MG capsule Take 1 capsule (100 mg total) by mouth every 8 (eight) hours. 05/21/18   Tegeler, Gwenyth Allegra, MD  Calcium Carb-Cholecalciferol (CALCIUM-VITAMIN D) 500-200 MG-UNIT tablet Take by  mouth.    [provider]  HYDROcodone-acetaminophen (NORCO/VICODIN) 5-325 MG tablet Take 1-2 tablets by mouth every 6 (six) hours as needed. 09/08/18   Blanchie Dessert, MD  ibuprofen (ADVIL) 800 MG tablet Take 1 tablet (800 mg total) by mouth 3 (three) times daily. 10/22/18   Tacy Learn, PA-C  predniSONE (DELTASONE) 10 MG tablet 6 tabs po day 1, 5 tabs po day 2, 4 tabs po day 3, 3 tabs po day 4, 2 tabs po day 5, 1 tab po day 6 12/31/17   Hudnall, Sharyn Lull, MD  tiZANidine (ZANAFLEX) 4 MG tablet Take 1 tablet (4 mg total) by mouth every 6 (six) hours as needed. 12/31/17   Dene Gentry, MD    Family History No family history on file.  Social History Social History   Tobacco Use  . Smoking status: Current Every Day Smoker    Packs/day: 0.50    Types: Cigarettes  . Smokeless tobacco: Never Used  Substance Use Topics  . Alcohol use: Not Currently  . Drug use: Not Currently     Allergies   Patient has no known allergies.   Review of Systems Review of Systems  Constitutional: Negative for fever.  Musculoskeletal: Positive for arthralgias, gait problem and joint swelling.  Skin: Positive for color change. Negative for rash and wound.  Allergic/Immunologic: Negative for immunocompromised state.  Neurological: Negative for weakness and numbness.  Hematological: Does  not bruise/bleed easily.  All other systems reviewed and are negative.    Physical Exam Updated Vital Signs BP (!) 153/95 (BP Location: Right Arm)   Pulse (!) 104   Temp 98.5 F (36.9 C) (Oral)   Resp 18   Ht 5' (1.524 m)   Wt 70.3 kg   SpO2 98%   BMI 30.27 kg/m   Physical Exam Vitals signs and nursing note reviewed.  Constitutional:      General: She is not in acute distress.    Appearance: She is well-developed. She is not diaphoretic.  HENT:     Head: Normocephalic and atraumatic.  Cardiovascular:     Pulses: Normal pulses.  Pulmonary:     Effort: Pulmonary effort is normal.   Musculoskeletal:        General: Swelling, tenderness and signs of injury present. No deformity.     Left hip: Normal.     Right knee: Normal.     Left knee: She exhibits decreased range of motion, swelling and ecchymosis. She exhibits no laceration and no erythema. Tenderness found.     Left ankle: Normal.       Legs:  Skin:    General: Skin is warm and dry.     Capillary Refill: Capillary refill takes less than 2 seconds.     Findings: No erythema or rash.  Neurological:     Mental Status: She is alert and oriented to person, place, and time.  Psychiatric:        Behavior: Behavior normal.      ED Treatments / Results  Labs (all labs ordered are listed, but only abnormal results are displayed) Labs Reviewed - No data to display  EKG None  Radiology Dg Knee Complete 4 Views Left  Result Date: 10/22/2018 CLINICAL DATA:  Left knee pain status post buckling today, severe pain and swelling with inability to bear weight EXAM: LEFT KNEE - COMPLETE 4+ VIEW COMPARISON:  None. FINDINGS: Trace left knee joint effusion without evidence of acute fracture or traumatic malalignment. Minimal tricompartmental degenerative changes are present. Soft tissues are otherwise unremarkable. IMPRESSION: Trace left knee joint effusion. No acute osseous abnormality. Electronically Signed   By: Lovena Le M.D.   On: 10/22/2018 20:42    Procedures Procedures (including critical care time)  Medications Ordered in ED Medications  ibuprofen (ADVIL) tablet 800 mg (has no administration in time range)  acetaminophen (TYLENOL) tablet 650 mg (has no administration in time range)     Initial Impression / Assessment and Plan / ED Course  I have reviewed the triage vital signs and the nursing notes.  Pertinent labs & imaging results that were available during my care of the patient were reviewed by me and considered in my medical decision making (see chart for details).  Clinical Course as of Oct 21 2112  Wed Oct 22, 6231  1912 48 year old female with left knee injury.  Pain to the anterior lateral left knee just distal to the patella with ecchymosis and swelling.  X-ray positive for small effusion, negative for bony abnormality.  Discussed with patient possible sprain versus meniscus injury.  Patient was placed in a knee immobilizer, ambulatory with her crutches to weight-bear as tolerated.  Recommend ice, elevation, Motrin and Tylenol.  Patient referred to sports medicine for recheck.   [LM]    Clinical Course User Index [LM] Tacy Learn, PA-C      Final Clinical Impressions(s) / ED Diagnoses   Final diagnoses:  Sprain  of left knee, unspecified ligament, initial encounter    ED Discharge Orders         Ordered    ibuprofen (ADVIL) 800 MG tablet  3 times daily     10/22/18 2107           Roque Lias 10/22/18 2114    Dorie Rank, MD 10/23/18 [phone]
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
## 2019-01-08 DIAGNOSIS — C801 Malignant (primary) neoplasm, unspecified: Secondary | ICD-10-CM

## 2019-01-08 HISTORY — DX: Malignant (primary) neoplasm, unspecified: C80.1

## 2019-01-27 ENCOUNTER — Other Ambulatory Visit: Payer: Self-pay

## 2019-01-27 ENCOUNTER — Emergency Department (HOSPITAL_BASED_OUTPATIENT_CLINIC_OR_DEPARTMENT_OTHER)
Admission: EM | Admit: 2019-01-27 | Discharge: 2019-01-27 | Disposition: A | Discharge status: Home / Self Care | Payer: Medicaid Other | Attending: Emergency Medicine | Admitting: Emergency Medicine

## 2019-01-27 ENCOUNTER — Encounter (HOSPITAL_BASED_OUTPATIENT_CLINIC_OR_DEPARTMENT_OTHER): Payer: Self-pay | Admitting: *Deleted

## 2019-01-27 ENCOUNTER — Emergency Department (HOSPITAL_BASED_OUTPATIENT_CLINIC_OR_DEPARTMENT_OTHER): Payer: Medicaid Other

## 2019-01-27 DIAGNOSIS — R918 Other nonspecific abnormal finding of lung field: Secondary | ICD-10-CM | POA: Diagnosis not present

## 2019-01-27 DIAGNOSIS — Z87891 Personal history of nicotine dependence: Secondary | ICD-10-CM | POA: Insufficient documentation

## 2019-01-27 DIAGNOSIS — R072 Precordial pain: Secondary | ICD-10-CM | POA: Diagnosis present

## 2019-01-27 LAB — TROPONIN I (HIGH SENSITIVITY): Troponin I (High Sensitivity): 2 ng/L (ref <18)

## 2019-01-27 LAB — CBC WITH DIFFERENTIAL/PLATELET
Abs Immature Granulocytes: 0.03 10*3/uL (ref 0.00–0.07)
Basophils Absolute: 0.1 10*3/uL (ref 0.0–0.1)
Basophils Relative: 1 %
Eosinophils Absolute: 0.2 10*3/uL (ref 0.0–0.5)
Eosinophils Relative: 2 %
HCT: 42.2 % (ref 36.0–46.0)
Hemoglobin: 14.1 g/dL (ref 12.0–15.0)
Immature Granulocytes: 0 %
Lymphocytes Relative: 14 %
Lymphs Abs: 1.2 10*3/uL (ref 0.7–4.0)
MCH: 29.1 pg (ref 26.0–34.0)
MCHC: 33.4 g/dL (ref 30.0–36.0)
MCV: 87 fL (ref 80.0–100.0)
Monocytes Absolute: 0.5 10*3/uL (ref 0.1–1.0)
Monocytes Relative: 5 %
Neutro Abs: 6.6 10*3/uL (ref 1.7–7.7)
Neutrophils Relative %: 78 %
Platelets: 255 10*3/uL (ref 150–400)
RBC: 4.85 MIL/uL (ref 3.87–5.11)
RDW: 13.2 % (ref 11.5–15.5)
WBC: 8.5 10*3/uL (ref 4.0–10.5)
nRBC: 0 % (ref 0.0–0.2)

## 2019-01-27 LAB — COMPREHENSIVE METABOLIC PANEL
ALT: 21 U/L (ref 0–44)
AST: 20 U/L (ref 15–41)
Albumin: 4.3 g/dL (ref 3.5–5.0)
Alkaline Phosphatase: 90 U/L (ref 38–126)
Anion gap: 12 (ref 5–15)
BUN: 20 mg/dL (ref 6–20)
CO2: 22 mmol/L (ref 22–32)
Calcium: 9.6 mg/dL (ref 8.9–10.3)
Chloride: 101 mmol/L (ref 98–111)
Creatinine, Ser: 0.94 mg/dL (ref 0.44–1.00)
GFR calc Af Amer: >60 mL/min (ref >60)
GFR calc non Af Amer: >60 mL/min (ref >60)
Glucose, Bld: 96 mg/dL (ref 70–99)
Potassium: 3.9 mmol/L (ref 3.5–5.1)
Sodium: 135 mmol/L (ref 135–145)
Total Bilirubin: 0.5 mg/dL (ref 0.3–1.2)
Total Protein: 9.4 g/dL — ABNORMAL HIGH (ref 6.5–8.1)

## 2019-01-27 LAB — D-DIMER, QUANTITATIVE: D-Dimer, Quant: 1.61 ug/mL-FEU — ABNORMAL HIGH (ref 0.00–0.50)

## 2019-01-27 LAB — LIPASE, BLOOD: Lipase: 41 U/L (ref 11–51)

## 2019-01-27 MED ORDER — ONDANSETRON HCL 4 MG/2ML IJ SOLN
4.0000 mg | Freq: Once | INTRAMUSCULAR | Status: AC
  Administered 2019-01-27: 4 mg via INTRAVENOUS
  Filled 2019-01-27: qty 2

## 2019-01-27 MED ORDER — MORPHINE SULFATE (PF) 4 MG/ML IV SOLN
4.0000 mg | Freq: Once | INTRAVENOUS | Status: AC
  Administered 2019-01-27: 4 mg via INTRAVENOUS
  Filled 2019-01-27: qty 1

## 2019-01-27 MED ORDER — IOHEXOL 350 MG/ML SOLN
100.0000 mL | Freq: Once | INTRAVENOUS | Status: AC | PRN
  Administered 2019-01-27: 100 mL via INTRAVENOUS

## 2019-01-27 MED ORDER — OXYCODONE-ACETAMINOPHEN 5-325 MG PO TABS: 1.0000 | ORAL_TABLET | Freq: Four times a day (QID) | ORAL | 0 refills | Status: DC | PRN

## 2019-01-27 NOTE — ED Notes (Signed)
ED Provider at bedside. 

## 2019-01-27 NOTE — ED Notes (Signed)
Pt. Reports at 1630 today she started having R sided chest pain and face pain on R side.  Pt. Daughter called EMS and EKG done.  Pt. Tearful speaking about a burning sensation on the R upper chest and the R back being painful.

## 2019-01-27 NOTE — ED Triage Notes (Signed)
Chest pain with radiation into her back while at work today. She is anxious at triage.

## 2019-01-27 NOTE — ED Notes (Signed)
Pt. Walks on crutches all the time due to past medical history.  Pt. Reports having filter for blood clots.

## 2019-01-27 NOTE — ED Provider Notes (Signed)
Emergency Department Provider Note   I have reviewed the triage vital signs and the nursing notes.   HISTORY  Chief Complaint Chest Pain   HPI Marcia King is a 48 y.o. female with PMH of chronic hip pain presents to the emergency department for evaluation of acute onset right-sided chest discomfort.  Symptoms began abruptly at approximately 4:30 PM today.  She describes associated shortness of breath and pain which is worse with movement or deep breathing.  No similar pain in the past.  Pain radiates to the back and she notes some burning, central chest discomfort as well.  No vomiting, nausea, diaphoresis.  No diarrhea.  No abdominal discomfort.  Patient tells me that she is unsure regarding history of DVT.  She describes history of a "filter" but states that she was never on blood thinners and cannot recall exactly what was done. Notes that this occurred in [facility] in approximately 2012.   Past Medical History:  Diagnosis Date  . Chronic pain     Patient Active Problem List   Diagnosis Date Noted  . Lung mass 01/28/2019  . Post-traumatic osteoarthritis of left hip 06/24/2014  . Closed fracture of left acetabulum ([place]) 02/08/2014  . Arthralgia of hip 11/20/2013  . Chronic cholecystitis with calculus 07/20/2013    Past Surgical History:  Procedure Laterality Date  . CHOLECYSTECTOMY    . HIP CLOSED REDUCTION      Allergies Patient has no known allergies.  No family history on file.  Social History Social History   Tobacco Use  . Smoking status: Former Smoker    Packs/day: 0.50    Types: Cigarettes  . Smokeless tobacco: Never Used  Substance Use Topics  . Alcohol use: Not Currently  . Drug use: Not Currently    Review of Systems  Constitutional: No fever/chills Eyes: No visual changes. ENT: No sore throat. Cardiovascular: Positive chest pain. Respiratory: Positive shortness of breath. Gastrointestinal: No abdominal pain.  No nausea, no vomiting.  No diarrhea.   No constipation. Genitourinary: Negative for dysuria. Musculoskeletal: Negative for back pain. Positive chronic left hip pain.  Skin: Negative for rash. Neurological: Negative for headaches, focal weakness or numbness.  10-point ROS otherwise negative.  ____________________________________________   PHYSICAL EXAM:  VITAL SIGNS: ED Triage Vitals  Enc Vitals Group     BP 01/27/19 1957 (!) 175/112     Pulse Rate 01/27/19 1957 (!) 103     Resp 01/27/19 1957 (!) 30     Temp 01/27/19 1957 98.7 F (37.1 C)     Temp Source 01/27/19 1957 Oral     SpO2 01/27/19 1957 96 %     Weight 01/27/19 1954 145 lb (65.8 kg)     Height 01/27/19 1954 5' (1.524 m)   Constitutional: Alert and oriented. Able to provide a full history but appears uncomfortable.  Eyes: Conjunctivae are normal.  Head: Atraumatic. Nose: No congestion/rhinnorhea. Mouth/Throat: Oropharynx non-erythematous. Neck: No stridor.   Cardiovascular: Tachycardia. Good peripheral circulation. Grossly normal heart sounds.   Respiratory: Normal respiratory effort.  No retractions. Lungs CTAB. Gastrointestinal: Soft and nontender. No distention.  Musculoskeletal: No lower extremity tenderness nor edema. No gross deformities of extremities. No focal right chest wall tenderness or crepitus.  Neurologic:  Normal speech and language. No gross focal neurologic deficits are appreciated.  Skin:  Skin is warm, dry and intact. No rash noted.   ____________________________________________   LABS (all labs ordered are listed, but only abnormal results are displayed)  Labs Reviewed  COMPREHENSIVE METABOLIC PANEL - Abnormal; Notable for the following components:      Result Value   Total Protein 9.4 (*)    All other components within normal limits  D-DIMER, QUANTITATIVE (NOT AT [facility]) - Abnormal; Notable for the following components:   D-Dimer, Quant 1.61 (*)    All other components within normal limits  LIPASE, BLOOD  CBC WITH  DIFFERENTIAL/PLATELET  TROPONIN I (HIGH SENSITIVITY)  TROPONIN I (HIGH SENSITIVITY)   ____________________________________________  EKG   EKG Interpretation  Date/Time:  Tuesday January 27 2019 19:55:46 EDT Ventricular Rate:  105 PR Interval:  156 QRS Duration: 80 QT Interval:  346 QTC Calculation: 457 R Axis:   -27 Text Interpretation:  Sinus tachycardia Possible Anterior infarct , age undetermined Abnormal ECG No STEMI. Similar to prior.  Confirmed by Nanda Quinton [phone]) on 01/27/2019 8:10:39 PM       ____________________________________________  RADIOLOGY  Dg Chest 2 View  Result Date: 01/27/2019 CLINICAL DATA:  Chest pain. EXAM: CHEST - 2 VIEW COMPARISON:  05/21/2018 and 04/29/2018 FINDINGS: Heart size is normal. There is fullness in the region of the azygos vein which could represent adenopathy. There appears to be abnormal density in that area particularly on the lateral view. There is diffuse accentuation of the interstitial markings to a greater degree than on the prior exams with slight atelectasis at the lung bases. No effusions or consolidative infiltrates. No acute bone abnormality. IMPRESSION: 1. Fullness in the region of the azygos vein. CT scan of the chest with contrast recommended for further evaluation. 2. Prominent interstitial markings with slight atelectasis at the lung bases. Some of the interstitial accentuation is chronic. Electronically Signed   By: Lorriane Shire M.D.   On: 01/27/2019 20:57   Ct Angio Chest Pe W And/or Wo Contrast  Result Date: 01/27/2019 CLINICAL DATA:  Chest pain EXAM: CT ANGIOGRAPHY CHEST WITH CONTRAST TECHNIQUE: Multidetector CT imaging of the chest was performed using the standard protocol during bolus administration of intravenous contrast. Multiplanar CT image reconstructions and MIPs were obtained to evaluate the vascular anatomy. CONTRAST:  159mL OMNIPAQUE IOHEXOL 350 MG/ML SOLN COMPARISON:  Chest radiograph January 27, 2019  FINDINGS: Cardiovascular: There is no demonstrable pulmonary embolus. There is no appreciable thoracic aortic aneurysm or dissection. The visualized great vessels appear normal. There is no pericardial effusion or pericardial thickening. Mediastinum/Nodes: Visualized thyroid appears normal. There is a mass arising in the right upper lobe extending into the superior hilar region measuring 3.1 x 2.5 cm. There is a right pretracheal lymph node measuring 2.0 x 1.8 cm. There is subcarinal adenopathy with the largest individual lymph node in the subcarinal region measuring 1.5 x 1.4 cm. There is a lymph node in the mid right hilar region measuring 1.8 x 1.4 cm. There are scattered smaller lymph nodes elsewhere in the mediastinum. There is a fairly small hiatal hernia. Lungs/Pleura: There is underlying emphysematous change with areas of fibrosis throughout the periphery of each lung. There is a mass arising in the right upper lobe extending into the right hilar region measuring 3.1 x 2.5 cm as noted above. There is an area of either mass or consolidation more peripherally in the right upper lobe anteriorly near the apex measuring 2.4 x 1.7 cm. There is atelectatic change in each lower lobe, more notable on the right than on the left. No pleural effusions are evident. Upper Abdomen: Visualized upper abdominal structures appear unremarkable. Musculoskeletal: There are no blastic or lytic bone lesions. No chest wall  lesions are evident. Review of the MIP images confirms the above findings. IMPRESSION: 1. No demonstrable pulmonary embolus. No thoracic aortic aneurysm or dissection. 2. Mass arising in the right upper lobe extending into the right hilum measuring 3.1 x 2.5 cm. Questionable second mass in the right upper lobe more peripherally and anteriorly measuring 2.4 x 1.7 cm versus consolidation in this area. There is adenopathy in the right hilum and right paratracheal regions. Neoplastic involvement is felt to be present.  It may be prudent to correlate with nuclear medicine PET study to further characterize. 3. Underlying emphysematous change with areas of fibrosis throughout the periphery of each lung. Fibrosis is somewhat evenly distributed between upper and lower lobe regions. There is atelectatic change in the lung bases, more on the right than on the left. Emphysema (ICD10-J43.9). Electronically Signed   By: Lowella Grip III M.D.   On: 01/27/2019 21:55    ____________________________________________   PROCEDURES  Procedure(s) performed:   Procedures  None ____________________________________________   INITIAL IMPRESSION / ASSESSMENT AND PLAN / ED COURSE  Pertinent labs & imaging results that were available during my care of the patient were reviewed by me and considered in my medical decision making (see chart for details).   Patient presents emergency department with acute onset right-sided atypical chest pain.  Patient describing somewhat pleuritic quality.  She describes history of possibly having IVC filter but I cannot find record of this.  She does not recall specifics.  Given her abnormal vital signs have added a D-dimer which is elevated.  Will obtain CTA of the chest for better characterization and r/o PE.   Labs interpreted. D-dimer elevated with normal troponin. CTA shows no PE but lung mass. Discussed with patient. Referred to Oncology and provided contact information for PET scan and further w/u. Pain meds prescribed for breakthrough pain. [place] drug database reviewed. Discussed ED return precautions.  ____________________________________________  FINAL CLINICAL IMPRESSION(S) / ED DIAGNOSES  Final diagnoses:  Precordial chest pain  Mass of right lung     MEDICATIONS GIVEN DURING THIS VISIT:  Medications  morphine 4 MG/ML injection 4 mg (4 mg Intravenous Given 01/27/19 2025)  iohexol (OMNIPAQUE) 350 MG/ML injection 100 mL (100 mLs Intravenous Contrast Given 01/27/19 2137)   morphine 4 MG/ML injection 4 mg (4 mg Intravenous Given 01/27/19 2146)  ondansetron (ZOFRAN) injection 4 mg (4 mg Intravenous Given 01/27/19 2146)     NEW OUTPATIENT MEDICATIONS STARTED DURING THIS VISIT:  Discharge Medication List as of 01/27/2019 10:27 PM    START taking these medications   Details  oxyCODONE-acetaminophen (PERCOCET/ROXICET) 5-325 MG tablet Take 1 tablet by mouth every 6 (six) hours as needed for severe pain., Starting Tue 01/27/2019, Normal        Note:  This document was prepared using Dragon voice recognition software and may include unintentional dictation errors.  Nanda Quinton, MD, [facility] Emergency Medicine    Jackilyn Umphlett, [facility], MD 01/28/19 [phone]

## 2019-01-27 NOTE — Discharge Instructions (Signed)
He was seen in the emergency department today with chest pain.  Your CT scan did not show blood clots or evidence of heart attack.  The CT scan did show a mass in the right lung which is concerning.  I would like for you to see an oncologist as soon as you are able to perform further testing.  You should return to the emergency department immediately with any new or suddenly worsening symptoms.  I have called in a prescription for pain medication to use only for severe, breakthrough pain.

## 2019-01-28 ENCOUNTER — Encounter: Payer: Self-pay | Admitting: *Deleted

## 2019-01-28 ENCOUNTER — Other Ambulatory Visit: Payer: Self-pay | Admitting: Hematology

## 2019-01-28 DIAGNOSIS — C3490 Malignant neoplasm of unspecified part of unspecified bronchus or lung: Secondary | ICD-10-CM | POA: Insufficient documentation

## 2019-01-28 DIAGNOSIS — R918 Other nonspecific abnormal finding of lung field: Secondary | ICD-10-CM | POA: Insufficient documentation

## 2019-01-28 NOTE — Progress Notes (Signed)
[place] CONSULT NOTE  Patient Care Team: Patient, No Pcp Per as PCP - General (General Practice)  HEME/ONC OVERVIEW: 1. Suspected lung cancer  -01/2019: RUL mass 3.1 x 2.5cm with R paratracheal/hilar, subcarinal and mediastinal LN's; a second mass/consolidation in the RUL periphery; extensive emphysematous changes   ASSESSMENT & PLAN:   Suspected lung cancer -I reviewed the patient's records in detail, including recent ER notes, lab studies, and imaging results -I also independently reviewed the radiologic images of recent CTA chest, and agree with findings as documented -In summary, patient presented to [facility] ER on 01/26/2019 for evaluation of new onset chest pain.  CTA chest was negative for PTE, but did demonstrate a large right upper lobe mass measuring approximately 3 cm extending into the superior hilar region with right paratracheal and hilar, as well as subcarinal and mediastinal LN enlargement.  In addition, there was a second mass/consolidation in the right upper lobe periphery.  There were also extensive emphysematous changes throughout the both lungs. -I discussed the imaging results in detail with the patient -I reviewed NCCN guidelines at length with the patient -In light of the right upper lobe mass with thoracic adenopathy, this is highly concerning for lung malignancy -I have ordered PET scan to assess for any evidence of metastatic disease -In addition, I have referred the patient to pulmonary medicine for urgent bronchoscopy w/ bx to confirm the pathology  -If lung cancer is confirmed, she will also likely require MRI of the brain to rule out metastatic disease -Pending the work-up above, we will determine the next steps  Right-sided chest pain -Likely due to pleural invasion by one of the RUL lung masses -Minimal improvement with Percocet q6hrs PRN -I have prescribed MS-Contin 15mg  BID, with IR morphine 15mg  q6hrs PRN for breakthrough pain -In addition,  I have recommended the patient to take ibuprofen 400-600mg  TID, which may help reduce the inflammation from pleural invasion -Finally, anticipating that the patient will need RT (either definitive or palliative), I have referred her to radiation oncology for further evaluation   Cough, recent fever -Patient reports Tmax of 100.9 today; she also has occasional cough, but has been trying to suppress her cough due to worsening chest pain -In light of the extensive emphysematous changes on the recent CT and large lung masses, she is at risk for developing respiratory infection -Therefore, I have prescribed azithromycin 500 mg daily x7 days  Tobacco abuse  -Patient had 20 pack year smoking history, and quit in 12/2018 -I counseled the patient on the importance of abstinence from all tobacco products   Lack of insurance -Patient is employed, but is in the process of transitioning health insurance, and is currently uninsured -I have requested the financial counselor to meet with the patient today to discuss application for charity care vs. Medicaid  Orders Placed This Encounter  Procedures  . CBC with Differential (Cancer Center Only)    Standing Status:   Future    Standing Expiration Date:   03/04/2020  . CMP ([facility] only)    Standing Status:   Future    Standing Expiration Date:   03/04/2020  . Ambulatory referral to Radiation Oncology    Referral Priority:   Urgent    Referral Type:   Consultation    Referral Reason:   Specialty Services Required    Referred to Provider:   Kyung Rudd, MD    Requested Specialty:   Radiation Oncology    Number of Visits Requested:  1  . Ambulatory referral to Pulmonology    Referral Priority:   Urgent    Referral Type:   Consultation    Referral Reason:   Specialty Services Required    Requested Specialty:   Pulmonary Disease    Number of Visits Requested:   1   All questions were answered. The patient knows to call the clinic with any  problems, questions or concerns.  Tentatively return on 02/10/2019 for labs, imaging and pathology results, clinic follow-up.  [name], MD 01/29/2019 2:00 PM   CHIEF COMPLAINTS/PURPOSE OF CONSULTATION:  "My chest is really hurting"  HISTORY OF PRESENTING ILLNESS:  Marcia King 48 y.o. female is here because of suspected lung cancer.  Patient presented to [facility] ER on 01/26/2019 for evaluation of new onset chest pain.  CTA chest was negative for PTE, but did demonstrate a large right upper lobe mass measuring approximately 3 cm extending into the superior hilar region with right paratracheal and hilar, as well as subcarinal and mediastinal LN enlargement.  In addition, there was a second mass/consolidation in the right upper lobe periphery.  There were also extensive emphysematous changes throughout the both lungs.  Patient reports that her right-sided chest pain started this past Sunday/Monday, sudden onset, sharp, exacerbated by coughing and deep inspiration, which she initially thought to be caused by a muscle strain.  However, her pain became progressively worse, prompting her to go to the emergency department for further evaluation.  After she was discharged from the ER, she was prescribed Percocet, which she takes every 6-8 hours with minimal relief.  She also reports that she had a fever of 100.9 this morning, but has not had any recurrent fever since then.  She has some cough, but she tries to suppress it due to the cough causing worsening chest pain.  She denies any sputum production.  She denies any other complaint today.  REVIEW OF SYSTEMS:   Constitutional: ( + ) fevers, ( - )  chills , ( - ) night sweats Eyes: ( - ) blurriness of vision, ( - ) double vision, ( - ) watery eyes Ears, nose, mouth, throat, and face: ( - ) mucositis, ( - ) sore throat Respiratory: ( + ) cough, ( - ) dyspnea, ( - ) wheezes Cardiovascular: ( - ) palpitation, ( - ) chest discomfort, ( - ) lower extremity  swelling Gastrointestinal:  ( - ) nausea, ( - ) heartburn, ( - ) change in bowel habits Skin: ( - ) abnormal skin rashes Lymphatics: ( - ) new lymphadenopathy, ( - ) easy bruising Neurological: ( - ) numbness, ( - ) tingling, ( - ) new weaknesses Behavioral/Psych: ( - ) mood change, ( - ) new changes  All other systems were reviewed with the patient and are negative.  I have reviewed her chart and materials related to her cancer extensively and collaborated history with the patient. Summary of oncologic history is as follows: Oncology History   No history exists.    MEDICAL HISTORY:  Past Medical History:  Diagnosis Date  . Chronic pain     SURGICAL HISTORY: Past Surgical History:  Procedure Laterality Date  . CHOLECYSTECTOMY    . HIP CLOSED REDUCTION      SOCIAL HISTORY: Social History   Socioeconomic History  . Marital status: Legally Separated    Spouse name: Not on file  . Number of children: Not on file  . Years of education: Not on file  . Highest  education level: Not on file  Occupational History  . Not on file  Social Needs  . Financial resource strain: Not on file  . Food insecurity    Worry: Not on file    Inability: Not on file  . Transportation needs    Medical: Not on file    Non-medical: Not on file  Tobacco Use  . Smoking status: Former Smoker    Packs/day: 0.50    Types: Cigarettes    Quit date: 12/09/2018    Years since quitting: 0.1  . Smokeless tobacco: Never Used  Substance and Sexual Activity  . Alcohol use: Not Currently  . Drug use: Not Currently  . Sexual activity: Not on file  Lifestyle  . Physical activity    Days per week: Not on file    Minutes per session: Not on file  . Stress: Not on file  Relationships  . Social Herbalist on phone: Not on file    Gets together: Not on file    Attends religious service: Not on file    Active member of club or organization: Not on file    Attends meetings of clubs or  organizations: Not on file    Relationship status: Not on file  . Intimate partner violence    Fear of current or ex partner: Not on file    Emotionally abused: Not on file    Physically abused: Not on file    Forced sexual activity: Not on file  Other Topics Concern  . Not on file  Social History Narrative  . Not on file    FAMILY HISTORY: History reviewed. No pertinent family history.  ALLERGIES:  has No Known Allergies.  MEDICATIONS:  Current Outpatient Medications  Medication Sig Dispense Refill  . albuterol (PROVENTIL HFA;VENTOLIN HFA) 108 (90 Base) MCG/ACT inhaler Inhale into the lungs.    Marland Kitchen azithromycin (ZITHROMAX) 500 MG tablet Take 1 tablet (500 mg total) by mouth daily for 7 days. 7 tablet 0  . benzonatate (TESSALON) 100 MG capsule Take 1 capsule (100 mg total) by mouth 3 (three) times daily as needed for cough. (Patient not taking: Reported on 01/29/2019) 21 capsule 0  . benzonatate (TESSALON) 100 MG capsule Take 1 capsule (100 mg total) by mouth every 8 (eight) hours. (Patient not taking: Reported on 01/29/2019) 21 capsule 0  . Calcium Carb-Cholecalciferol (CALCIUM-VITAMIN D) 500-200 MG-UNIT tablet Take by mouth.    Marland Kitchen HYDROcodone-acetaminophen (NORCO/VICODIN) 5-325 MG tablet Take 1-2 tablets by mouth every 6 (six) hours as needed. (Patient not taking: Reported on 01/29/2019) 10 tablet 0  . ibuprofen (ADVIL) 800 MG tablet Take 1 tablet (800 mg total) by mouth 3 (three) times daily. (Patient not taking: Reported on 01/29/2019) 21 tablet 0  . morphine (MS CONTIN) 15 MG 12 hr tablet Take 1 tablet (15 mg total) by mouth every 12 (twelve) hours. 60 tablet 0  . morphine (MSIR) 15 MG tablet Take 1 tablet (15 mg total) by mouth every 6 (six) hours as needed for severe pain. 40 tablet 0  . oxyCODONE-acetaminophen (PERCOCET/ROXICET) 5-325 MG tablet Take 1 tablet by mouth every 6 (six) hours as needed for severe pain. 12 tablet 0  . predniSONE (DELTASONE) 10 MG tablet 6 tabs po day 1,  5 tabs po day 2, 4 tabs po day 3, 3 tabs po day 4, 2 tabs po day 5, 1 tab po day 6 (Patient not taking: Reported on 01/29/2019) 21 tablet 0  . tiZANidine (  ZANAFLEX) 4 MG tablet Take 1 tablet (4 mg total) by mouth every 6 (six) hours as needed. (Patient not taking: Reported on 01/29/2019) 60 tablet 1   No current facility-administered medications for this visit.     PHYSICAL EXAMINATION: ECOG PERFORMANCE STATUS: 1 - Symptomatic but completely ambulatory  Vitals:   01/29/19 1308  BP: (!) 159/97  Pulse: 97  Resp: 20  Temp: (!) 97.1 F (36.2 C)  SpO2: 100%   Filed Weights   01/29/19 1308  Weight: 143 lb (64.9 kg)    GENERAL: alert, uncomfortable due to right-sided chest pain, exacerbated by cough  SKIN: skin color, texture, turgor are normal, no rashes or significant lesions EYES: conjunctiva are pink and non-injected, sclera clear OROPHARYNX: no exudate, no erythema; lips, buccal mucosa, and tongue normal  NECK: supple, non-tender LYMPH:  no palpable lymphadenopathy in the cervical LUNGS: limited inspiration due to pain  HEART: regular rate & rhythm, no murmurs, no lower extremity edema ABDOMEN: soft, non-tender, non-distended, normal bowel sounds Musculoskeletal: no cyanosis of digits and no clubbing  PSYCH: alert & oriented x 3, fluent speech NEURO: no focal motor/sensory deficits  LABORATORY DATA:  I have reviewed the data as listed Lab Results  Component Value Date   WBC 8.5 01/27/2019   HGB 14.1 01/27/2019   HCT 42.2 01/27/2019   MCV 87.0 01/27/2019   PLT 255 01/27/2019   Lab Results  Component Value Date   NA 135 01/27/2019   K 3.9 01/27/2019   CL 101 01/27/2019   CO2 22 01/27/2019    RADIOGRAPHIC STUDIES: I have personally reviewed the radiological images as listed and agreed with the findings in the report. Dg Chest 2 View  Result Date: 01/27/2019 CLINICAL DATA:  Chest pain. EXAM: CHEST - 2 VIEW COMPARISON:  05/21/2018 and 04/29/2018 FINDINGS: Heart  size is normal. There is fullness in the region of the azygos vein which could represent adenopathy. There appears to be abnormal density in that area particularly on the lateral view. There is diffuse accentuation of the interstitial markings to a greater degree than on the prior exams with slight atelectasis at the lung bases. No effusions or consolidative infiltrates. No acute bone abnormality. IMPRESSION: 1. Fullness in the region of the azygos vein. CT scan of the chest with contrast recommended for further evaluation. 2. Prominent interstitial markings with slight atelectasis at the lung bases. Some of the interstitial accentuation is chronic. Electronically Signed   By: Lorriane Shire M.D.   On: 01/27/2019 20:57   Ct Angio Chest Pe W And/or Wo Contrast  Result Date: 01/27/2019 CLINICAL DATA:  Chest pain EXAM: CT ANGIOGRAPHY CHEST WITH CONTRAST TECHNIQUE: Multidetector CT imaging of the chest was performed using the standard protocol during bolus administration of intravenous contrast. Multiplanar CT image reconstructions and MIPs were obtained to evaluate the vascular anatomy. CONTRAST:  162mL OMNIPAQUE IOHEXOL 350 MG/ML SOLN COMPARISON:  Chest radiograph January 27, 2019 FINDINGS: Cardiovascular: There is no demonstrable pulmonary embolus. There is no appreciable thoracic aortic aneurysm or dissection. The visualized great vessels appear normal. There is no pericardial effusion or pericardial thickening. Mediastinum/Nodes: Visualized thyroid appears normal. There is a mass arising in the right upper lobe extending into the superior hilar region measuring 3.1 x 2.5 cm. There is a right pretracheal lymph node measuring 2.0 x 1.8 cm. There is subcarinal adenopathy with the largest individual lymph node in the subcarinal region measuring 1.5 x 1.4 cm. There is a lymph node in the mid right  hilar region measuring 1.8 x 1.4 cm. There are scattered smaller lymph nodes elsewhere in the mediastinum. There is a  fairly small hiatal hernia. Lungs/Pleura: There is underlying emphysematous change with areas of fibrosis throughout the periphery of each lung. There is a mass arising in the right upper lobe extending into the right hilar region measuring 3.1 x 2.5 cm as noted above. There is an area of either mass or consolidation more peripherally in the right upper lobe anteriorly near the apex measuring 2.4 x 1.7 cm. There is atelectatic change in each lower lobe, more notable on the right than on the left. No pleural effusions are evident. Upper Abdomen: Visualized upper abdominal structures appear unremarkable. Musculoskeletal: There are no blastic or lytic bone lesions. No chest wall lesions are evident. Review of the MIP images confirms the above findings. IMPRESSION: 1. No demonstrable pulmonary embolus. No thoracic aortic aneurysm or dissection. 2. Mass arising in the right upper lobe extending into the right hilum measuring 3.1 x 2.5 cm. Questionable second mass in the right upper lobe more peripherally and anteriorly measuring 2.4 x 1.7 cm versus consolidation in this area. There is adenopathy in the right hilum and right paratracheal regions. Neoplastic involvement is felt to be present. It may be prudent to correlate with nuclear medicine PET study to further characterize. 3. Underlying emphysematous change with areas of fibrosis throughout the periphery of each lung. Fibrosis is somewhat evenly distributed between upper and lower lobe regions. There is atelectatic change in the lung bases, more on the right than on the left. Emphysema (ICD10-J43.9). Electronically Signed   By: Lowella Grip III M.D.   On: 01/27/2019 21:55    PATHOLOGY: I have reviewed the pathology reports as documented in the oncologist history.

## 2019-01-28 NOTE — Progress Notes (Signed)
Reached out to Marcia King to introduce myself as the office RN Navigator and explain our new patient process. Reviewed the reason for their referral and scheduled their new patient appointment along with labs. Provided address and directions to the office including call back phone number. Reviewed with patient any concerns they may have or any possible barriers to attending their appointment.   Informed patient about my role as a navigator and that I will meet with them prior to their New Patient appointment and more fully discuss what services I can provide. At this time patient has no further questions or needs.

## 2019-01-29 ENCOUNTER — Encounter: Payer: Self-pay | Admitting: *Deleted

## 2019-01-29 ENCOUNTER — Encounter: Payer: Self-pay | Admitting: Hematology

## 2019-01-29 ENCOUNTER — Other Ambulatory Visit: Payer: Self-pay

## 2019-01-29 ENCOUNTER — Inpatient Hospital Stay: Payer: Medicaid Other | Attending: Hematology | Admitting: Hematology

## 2019-01-29 ENCOUNTER — Other Ambulatory Visit: Payer: Self-pay | Admitting: Hematology

## 2019-01-29 ENCOUNTER — Telehealth: Payer: Self-pay | Admitting: Radiation Oncology

## 2019-01-29 VITALS — BP 159/97 | HR 97 | Temp 97.1°F | Resp 20 | Ht 60.0 in | Wt 143.0 lb

## 2019-01-29 DIAGNOSIS — R059 Cough, unspecified: Secondary | ICD-10-CM

## 2019-01-29 DIAGNOSIS — J439 Emphysema, unspecified: Secondary | ICD-10-CM | POA: Insufficient documentation

## 2019-01-29 DIAGNOSIS — R590 Localized enlarged lymph nodes: Secondary | ICD-10-CM | POA: Insufficient documentation

## 2019-01-29 DIAGNOSIS — R918 Other nonspecific abnormal finding of lung field: Secondary | ICD-10-CM | POA: Insufficient documentation

## 2019-01-29 DIAGNOSIS — R05 Cough: Secondary | ICD-10-CM | POA: Diagnosis not present

## 2019-01-29 DIAGNOSIS — R0789 Other chest pain: Secondary | ICD-10-CM | POA: Insufficient documentation

## 2019-01-29 DIAGNOSIS — G893 Neoplasm related pain (acute) (chronic): Secondary | ICD-10-CM

## 2019-01-29 DIAGNOSIS — Z79899 Other long term (current) drug therapy: Secondary | ICD-10-CM | POA: Diagnosis not present

## 2019-01-29 DIAGNOSIS — Z791 Long term (current) use of non-steroidal anti-inflammatories (NSAID): Secondary | ICD-10-CM | POA: Diagnosis not present

## 2019-01-29 DIAGNOSIS — Z87891 Personal history of nicotine dependence: Secondary | ICD-10-CM | POA: Diagnosis not present

## 2019-01-29 DIAGNOSIS — R509 Fever, unspecified: Secondary | ICD-10-CM | POA: Insufficient documentation

## 2019-01-29 DIAGNOSIS — C3490 Malignant neoplasm of unspecified part of unspecified bronchus or lung: Secondary | ICD-10-CM

## 2019-01-29 MED ORDER — AZITHROMYCIN 500 MG PO TABS: 500.0000 mg | ORAL_TABLET | Freq: Every day | ORAL | 0 refills | Status: DC

## 2019-01-29 MED ORDER — MORPHINE SULFATE 15 MG PO TABS: 15.0000 mg | ORAL_TABLET | Freq: Four times a day (QID) | ORAL | 0 refills | Status: DC | PRN

## 2019-01-29 MED ORDER — MORPHINE SULFATE ER 15 MG PO TBCR: 15.0000 mg | EXTENDED_RELEASE_TABLET | Freq: Two times a day (BID) | ORAL | 0 refills | Status: DC

## 2019-01-29 NOTE — Telephone Encounter (Signed)
New message:   LVM for patient to return call to schedule appt from referral received.

## 2019-01-29 NOTE — Progress Notes (Signed)
Initial RN Navigator Patient Visit  Name: Reshanda Lewey Date of Referral : 01/28/2019 Diagnosis: Suspected Lung Cancer  Met with patient prior to their visit with MD. Hanley Seamen patient "Your Patient Navigator" handout which explains my role, areas in which I am able to help, and all the contact information for myself and the office. Also gave patient MD and Navigator business card. Reviewed with patient the general overview of expected course after initial diagnosis and time frame for all steps to be completed.  New patient packet given to patient which includes: orientation to office and staff; campus directory; education on My Chart and Advance Directives; and patient centered education on Lung Cancer  Patient has no insurance. She will visit with Otilio Carpen, financial specialist after her visit with Dr Maylon Peppers. She has no PCP but will likely have difficulty without insurance. She knows that this is a need once insurance is in place.   Patient completed visit with Dr. Maylon Peppers  Patient will need  PET - scheduled for 02/06/2019. PET education given to patient.  Referral to pulmonary for EBUS - referral placed in [facility] Referral to Radiation Oncology - referral placed in [facility]  Patient understands all follow up procedures and expectations. They have my number to reach out for any further clarification or additional needs.

## 2019-02-02 NOTE — Progress Notes (Signed)
Thoracic Location of Tumor / Histology: Right Upper Lobe lung mass  Patient presented to the ER on 10/19 with complaints of right sided chest pain and back.  PET 02/06/2019:  CTA Chest 01/27/2019: There is a mass arising in the right upper lobe extending into the superior hilar region measuring 3.1 x 2.5 cm. There is a right pretracheal lymph node measuring 2.0 x 1.8 cm. There is subcarinal adenopathy with the largest individual lymph node in the subcarinal region measuring 1.5 x 1.4 cm. There is a lymph node in the mid right hilar region measuring 1.8 x 1.4 cm. There are scattered smaller lymph nodes elsewhere in the mediastinum. There is a fairly small hiatal hernia.  Underlying emphysematous change.   Biopsies of   Tobacco/Marijuana/Snuff/ETOH use: Former smoker, 20 pack per year, quit 12/2018.  Past/Anticipated interventions by cardiothoracic surgery, if any:  Dr. Valeta Harms 04/06/2019  Past/Anticipated interventions by medical oncology, if any:  Dr. Maylon Peppers 01/29/2019 - In light of the right upper lobe mass with thoracic adenopathy, this is highly concerning for lung malignancy. -I have ordered PET scan to assess for any evidence of metastatic disease. -In addition I have referred the patient to pulmonary medicine for urgent bronchoscopy with biopsy to confirm the pathology. -If lung cancer is confirmed, she will also likely require MRI of the brain to rule out metastatic disease. -Anticipating that the patient will need RT (either definitive or palliative), I have referred her to radiation oncology for further evaluation. -Follow-up appointment 02/10/2019   Signs/Symptoms  Weight changes, if any: No, stable  Respiratory complaints, if any: Has SOB at rest and with activities.  States she has pain with deep breathing.  Hemoptysis, if any: occasional productive cough, clear/yellow sputum.  Pain issues, if any:  Pain when breathing deep.  SAFETY ISSUES:  Prior radiation?  No  Pacemaker/ICD? No  Possible current pregnancy? Postmenopausal  Is the patient on methotrexate? No  Current Complaints / other details:

## 2019-02-03 ENCOUNTER — Other Ambulatory Visit: Payer: Self-pay

## 2019-02-03 ENCOUNTER — Ambulatory Visit
Admission: RE | Admit: 2019-02-03 | Discharge: 2019-02-03 | Disposition: A | Discharge status: Home / Self Care | Payer: Self-pay | Source: Ambulatory Visit | Attending: Radiation Oncology | Admitting: Radiation Oncology

## 2019-02-03 ENCOUNTER — Encounter: Payer: Self-pay | Admitting: Radiation Oncology

## 2019-02-03 VITALS — Ht 60.0 in | Wt 143.0 lb

## 2019-02-03 DIAGNOSIS — R918 Other nonspecific abnormal finding of lung field: Secondary | ICD-10-CM

## 2019-02-03 DIAGNOSIS — C3490 Malignant neoplasm of unspecified part of unspecified bronchus or lung: Secondary | ICD-10-CM

## 2019-02-03 DIAGNOSIS — Z3009 Encounter for other general counseling and advice on contraception: Secondary | ICD-10-CM
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
## 2019-02-04 ENCOUNTER — Encounter: Payer: Self-pay | Admitting: General Practice

## 2019-02-04 ENCOUNTER — Ambulatory Visit (INDEPENDENT_AMBULATORY_CARE_PROVIDER_SITE_OTHER): Payer: Self-pay | Admitting: Pulmonary Disease

## 2019-02-04 ENCOUNTER — Encounter: Payer: Self-pay | Admitting: Pulmonary Disease

## 2019-02-04 ENCOUNTER — Encounter: Payer: Self-pay | Admitting: *Deleted

## 2019-02-04 VITALS — BP 160/88 | HR 102 | Temp 97.8°F | Ht 60.0 in | Wt 148.0 lb

## 2019-02-04 DIAGNOSIS — R590 Localized enlarged lymph nodes: Secondary | ICD-10-CM

## 2019-02-04 DIAGNOSIS — R918 Other nonspecific abnormal finding of lung field: Secondary | ICD-10-CM

## 2019-02-04 DIAGNOSIS — Z87891 Personal history of nicotine dependence: Secondary | ICD-10-CM

## 2019-02-04 NOTE — Progress Notes (Signed)
Synopsis: Referred in October 2020 for lung mass by by Dr. Vedia Coffer.   Subjective:   PATIENT ID: Buford Dresser GENDER: female DOB: Feb 17, 1971, MRN: [id number]  Chief Complaint  Patient presents with  . Consult    This is a 48 year old female with a history of tobacco abuse quit in September 2020 after 20-pack-year history.  Had recent cough and fevers and a CT scan of the chest for evaluation of right-sided chest pain.  Patient was found to have a right upper lobe lung mass 3.1 x 2.5 cm and right paratracheal subcarinal mediastinal adenopathy.  Addition there was extensive emphysematous change in the lung.  OV 02/04/2019: Patient seen today in the office regarding CT imaging.  Discussed CT imaging today with the patient.  Patient is agreeable to proceed with planned bronchoscopy.  She feels more tired.  She has lost some appetite.  She is doing the best she can.  She is very anxious about the recent diagnosis.  Patient denies hemoptysis.   Past Medical History:  Diagnosis Date  . Chronic pain      Family History  Problem Relation Age of Onset  . Lung cancer Father   . Breast cancer Sister      Past Surgical History:  Procedure Laterality Date  . CHOLECYSTECTOMY    . HIP CLOSED REDUCTION      Social History   Socioeconomic History  . Marital status: Legally Separated    Spouse name: Not on file  . Number of children: Not on file  . Years of education: Not on file  . Highest education level: Not on file  Occupational History  . Not on file  Social Needs  . Financial resource strain: Not on file  . Food insecurity    Worry: Not on file    Inability: Not on file  . Transportation needs    Medical: No    Non-medical: No  Tobacco Use  . Smoking status: Former Smoker    Packs/day: 0.50    Types: Cigarettes    Quit date: 12/09/2018    Years since quitting: 0.1  . Smokeless tobacco: Never Used  Substance and Sexual Activity  . Alcohol use: Not Currently  . Drug  use: Not Currently  . Sexual activity: Not on file  Lifestyle  . Physical activity    Days per week: Not on file    Minutes per session: Not on file  . Stress: Not on file  Relationships  . Social Herbalist on phone: Not on file    Gets together: Not on file    Attends religious service: Not on file    Active member of club or organization: Not on file    Attends meetings of clubs or organizations: Not on file    Relationship status: Not on file  . Intimate partner violence    Fear of current or ex partner: Not on file    Emotionally abused: Not on file    Physically abused: Not on file    Forced sexual activity: Not on file  Other Topics Concern  . Not on file  Social History Narrative  . Not on file     No Known Allergies   Outpatient Medications Prior to Visit  Medication Sig Dispense Refill  . albuterol (PROVENTIL HFA;VENTOLIN HFA) 108 (90 Base) MCG/ACT inhaler Inhale into the lungs.    Marland Kitchen aspirin EC 325 MG tablet Take by mouth.    . benzonatate (TESSALON) 100  MG capsule Take 1 capsule (100 mg total) by mouth every 8 (eight) hours. 21 capsule 0  . Calcium Carb-Cholecalciferol (CALCIUM-VITAMIN D) 500-200 MG-UNIT tablet Take by mouth.    Marland Kitchen ibuprofen (ADVIL) 800 MG tablet Take 1 tablet (800 mg total) by mouth 3 (three) times daily. 21 tablet 0  . morphine (MS CONTIN) 15 MG 12 hr tablet Take 1 tablet (15 mg total) by mouth every 12 (twelve) hours. 60 tablet 0  . morphine (MSIR) 15 MG tablet Take 1 tablet (15 mg total) by mouth every 6 (six) hours as needed for severe pain. 40 tablet 0  . azithromycin (ZITHROMAX) 500 MG tablet Take 1 tablet (500 mg total) by mouth daily for 7 days. 7 tablet 0  . benzonatate (TESSALON) 100 MG capsule Take 1 capsule (100 mg total) by mouth 3 (three) times daily as needed for cough. 21 capsule 0  . tiZANidine (ZANAFLEX) 4 MG tablet Take 1 tablet (4 mg total) by mouth every 6 (six) hours as needed. 60 tablet 1  . HYDROcodone-acetaminophen  (NORCO/VICODIN) 5-325 MG tablet Take 1-2 tablets by mouth every 6 (six) hours as needed. (Patient not taking: Reported on 01/29/2019) 10 tablet 0  . oxyCODONE-acetaminophen (PERCOCET/ROXICET) 5-325 MG tablet Take 1 tablet by mouth every 6 (six) hours as needed for severe pain. (Patient not taking: Reported on 02/03/2019) 12 tablet 0  . predniSONE (DELTASONE) 10 MG tablet 6 tabs po day 1, 5 tabs po day 2, 4 tabs po day 3, 3 tabs po day 4, 2 tabs po day 5, 1 tab po day 6 (Patient not taking: Reported on 01/29/2019) 21 tablet 0   No facility-administered medications prior to visit.     Review of Systems  Constitutional: Positive for malaise/fatigue and weight loss. Negative for chills and fever.  HENT: Negative for hearing loss, sore throat and tinnitus.   Eyes: Negative for blurred vision and double vision.  Respiratory: Positive for cough and shortness of breath. Negative for hemoptysis, sputum production, wheezing and stridor.   Cardiovascular: Negative for chest pain, palpitations, orthopnea, leg swelling and PND.  Gastrointestinal: Negative for abdominal pain, constipation, diarrhea, heartburn, nausea and vomiting.  Genitourinary: Negative for dysuria, hematuria and urgency.  Musculoskeletal: Negative for joint pain and myalgias.  Skin: Negative for itching and rash.  Neurological: Negative for dizziness, tingling, weakness and headaches.  Endo/Heme/Allergies: Negative for environmental allergies. Does not bruise/bleed easily.  Psychiatric/Behavioral: Negative for depression. The patient is not nervous/anxious and does not have insomnia.   All other systems reviewed and are negative.    Objective:  Physical Exam Vitals signs reviewed.  Constitutional:      General: She is not in acute distress.    Appearance: She is well-developed.  HENT:     Head: Normocephalic and atraumatic.  Eyes:     General: No scleral icterus.    Conjunctiva/sclera: Conjunctivae normal.     Pupils: Pupils  are equal, round, and reactive to light.  Neck:     Musculoskeletal: Neck supple.     Vascular: No JVD.     Trachea: No tracheal deviation.  Cardiovascular:     Rate and Rhythm: Normal rate and regular rhythm.     Heart sounds: Normal heart sounds. No murmur.  Pulmonary:     Effort: Pulmonary effort is normal. No tachypnea, accessory muscle usage or respiratory distress.     Breath sounds: No stridor. No wheezing, rhonchi or rales.     Comments: Diminished breath sounds in the right  upper lobe, no wheeze Abdominal:     General: Bowel sounds are normal. There is no distension.     Palpations: Abdomen is soft.     Tenderness: There is no abdominal tenderness.  Musculoskeletal:        General: No tenderness.  Lymphadenopathy:     Cervical: No cervical adenopathy.  Skin:    General: Skin is warm and dry.     Capillary Refill: Capillary refill takes less than 2 seconds.     Findings: No rash.  Neurological:     Mental Status: She is alert and oriented to person, place, and time.  Psychiatric:        Behavior: Behavior normal.      Vitals:   02/04/19 0854  BP: (!) 160/88  Pulse: (!) 102  Temp: 97.8 F (36.6 C)  TempSrc: Oral  SpO2: 93%  Weight: 148 lb (67.1 kg)  Height: 5' (1.524 m)   93% on RA BMI Readings from Last 3 Encounters:  02/04/19 28.90 kg/m  02/03/19 27.93 kg/m  01/29/19 27.93 kg/m   Wt Readings from Last 3 Encounters:  02/04/19 148 lb (67.1 kg)  02/03/19 143 lb (64.9 kg)  01/29/19 143 lb (64.9 kg)     CBC    Component Value Date/Time   WBC 8.5 01/27/2019 2021   RBC 4.85 01/27/2019 2021   HGB 14.1 01/27/2019 2021   HCT 42.2 01/27/2019 2021   PLT 255 01/27/2019 2021   MCV 87.0 01/27/2019 2021   MCH 29.1 01/27/2019 2021   MCHC 33.4 01/27/2019 2021   RDW 13.2 01/27/2019 2021   LYMPHSABS 1.2 01/27/2019 2021   MONOABS 0.5 01/27/2019 2021   EOSABS 0.2 01/27/2019 2021   BASOSABS 0.1 01/27/2019 2021    Chest Imaging: 01/27/2019: CT chest:  Right upper lobe lung mass, large paratracheal and hilar adenopathy. Evidence of fibrosis. The patient's images have been independently reviewed by me.    Pulmonary Functions Testing Results: No flowsheet data found.  FeNO: None   Pathology: None   Echocardiogram: None   Heart Catheterization: None     Assessment & Plan:     ICD-10-CM   1. Mass of upper lobe of right lung  R91.8 Ambulatory referral to Pulmonology  2. Mediastinal adenopathy  R59.0 Ambulatory referral to Pulmonology  3. Hilar adenopathy  R59.0 Ambulatory referral to Pulmonology  4. Former smoker  Z87.891     Discussion: 48 year old female recently diagnosed with a right upper lobe lung mass and mediastinal adenopathy concerning for an advanced stage bronchogenic carcinoma.  Plan: Today in the office we discussed risk benefits and alternatives of proceeding with bronchoscopy.  We will plan for outpatient video bronchoscopy with endobronchial ultrasound and staging of the mediastinum.  Sampling of the paratracheal mass and hilar adenopathy.   We will plan for this to be completed on November 4. We are unable to schedule the patient for this Friday afternoon due to the health system not having rapid Covid testing.  I have been informed that we are only able to complete preop Covid testing with a turnaround time of approximately 72 hours.  Therefore do to availability in the operating room and scheduling our next available slot will be November 4 in the afternoon.  Greater than 50% of this patient 60-minute visit was spent face-to-face discussing the above recommendations and treatment plan.  Review of the risk benefits and alternatives of bronchoscopy as well as review of the patient's CT imaging.    Current Outpatient Medications:  .  albuterol (PROVENTIL HFA;VENTOLIN HFA) 108 (90 Base) MCG/ACT inhaler, Inhale into the lungs., Disp: , Rfl:  .  aspirin EC 325 MG tablet, Take by mouth., Disp: , Rfl:  .   benzonatate (TESSALON) 100 MG capsule, Take 1 capsule (100 mg total) by mouth every 8 (eight) hours., Disp: 21 capsule, Rfl: 0 .  Calcium Carb-Cholecalciferol (CALCIUM-VITAMIN D) 500-200 MG-UNIT tablet, Take by mouth., Disp: , Rfl:  .  ibuprofen (ADVIL) 800 MG tablet, Take 1 tablet (800 mg total) by mouth 3 (three) times daily., Disp: 21 tablet, Rfl: 0 .  morphine (MS CONTIN) 15 MG 12 hr tablet, Take 1 tablet (15 mg total) by mouth every 12 (twelve) hours., Disp: 60 tablet, Rfl: 0 .  morphine (MSIR) 15 MG tablet, Take 1 tablet (15 mg total) by mouth every 6 (six) hours as needed for severe pain., Disp: 40 tablet, Rfl: 0   Garner Nash, DO [facility] 02/04/2019 9:29 AM

## 2019-02-04 NOTE — Patient Instructions (Addendum)
Thank you for visiting Dr. Valeta Harms at [facility]. Today we recommend the following:  Orders Placed This Encounter  Procedures  . Ambulatory referral to Pulmonology   We will schedule you for pre-op covid testing. Tentative procedural scheduling to be completed on November 4.  Return in about 4 weeks (around 03/04/2019).    Please do your part to reduce the spread of COVID-19.

## 2019-02-04 NOTE — Progress Notes (Signed)
[place] Psychosocial Distress Screening Clinical Social Work  Clinical Social Work was referred by distress screening protocol.  The patient scored a 7 on the Psychosocial Distress Thermometer which indicates moderate distress. Clinical Social Worker contacted patient by phone to assess for distress and other psychosocial needs. Unable to reach patient, left VM w my contact information, encouragement to call back and information about Support Center resources.    ONCBCN DISTRESS SCREENING 02/03/2019  Screening Type Initial Screening  Distress experienced in past week (1-10) 7  Emotional problem type Nervousness/Anxiety;Adjusting to illness  Other Contact via Phone    Clinical Social Worker follow up needed: Yes.    If yes, follow up plan:  Beverely Pace, [place], LCSW Clinical Social Worker Phone:  [phone]

## 2019-02-04 NOTE — H&P (View-Only) (Signed)
Synopsis: Referred in October 2020 for lung mass by by Dr. Vedia Coffer.   Subjective:   PATIENT ID: Marcia King GENDER: female DOB: [birth year], MRN: [id number]  Chief Complaint  Patient presents with  . Consult    This is a 47 year old female with a history of tobacco abuse quit in September 2020 after 20-pack-year history.  Had recent cough and fevers and a CT scan of the chest for evaluation of right-sided chest pain.  Patient was found to have a right upper lobe lung mass 3.1 x 2.5 cm and right paratracheal subcarinal mediastinal adenopathy.  Addition there was extensive emphysematous change in the lung.  OV 02/04/2019: Patient seen today in the office regarding CT imaging.  Discussed CT imaging today with the patient.  Patient is agreeable to proceed with planned bronchoscopy.  She feels more tired.  She has lost some appetite.  She is doing the best she can.  She is very anxious about the recent diagnosis.  Patient denies hemoptysis.   Past Medical History:  Diagnosis Date  . Chronic pain      Family History  Problem Relation Age of Onset  . Lung cancer Father   . Breast cancer Sister      Past Surgical History:  Procedure Laterality Date  . CHOLECYSTECTOMY    . HIP CLOSED REDUCTION      Social History   Socioeconomic History  . Marital status: Legally Separated    Spouse name: Not on file  . Number of children: Not on file  . Years of education: Not on file  . Highest education level: Not on file  Occupational History  . Not on file  Social Needs  . Financial resource strain: Not on file  . Food insecurity    Worry: Not on file    Inability: Not on file  . Transportation needs    Medical: No    Non-medical: No  Tobacco Use  . Smoking status: Former Smoker    Packs/day: 0.50    Types: Cigarettes    Quit date: 12/09/2018    Years since quitting: 0.1  . Smokeless tobacco: Never Used  Substance and Sexual Activity  . Alcohol use: Not Currently  . Drug  use: Not Currently  . Sexual activity: Not on file  Lifestyle  . Physical activity    Days per week: Not on file    Minutes per session: Not on file  . Stress: Not on file  Relationships  . Social Herbalist on phone: Not on file    Gets together: Not on file    Attends religious service: Not on file    Active member of club or organization: Not on file    Attends meetings of clubs or organizations: Not on file    Relationship status: Not on file  . Intimate partner violence    Fear of current or ex partner: Not on file    Emotionally abused: Not on file    Physically abused: Not on file    Forced sexual activity: Not on file  Other Topics Concern  . Not on file  Social History Narrative  . Not on file     No Known Allergies   Outpatient Medications Prior to Visit  Medication Sig Dispense Refill  . albuterol (PROVENTIL HFA;VENTOLIN HFA) 108 (90 Base) MCG/ACT inhaler Inhale into the lungs.    Marland Kitchen aspirin EC 325 MG tablet Take by mouth.    . benzonatate (TESSALON) 100  MG capsule Take 1 capsule (100 mg total) by mouth every 8 (eight) hours. 21 capsule 0  . Calcium Carb-Cholecalciferol (CALCIUM-VITAMIN D) 500-200 MG-UNIT tablet Take by mouth.    Marland Kitchen ibuprofen (ADVIL) 800 MG tablet Take 1 tablet (800 mg total) by mouth 3 (three) times daily. 21 tablet 0  . morphine (MS CONTIN) 15 MG 12 hr tablet Take 1 tablet (15 mg total) by mouth every 12 (twelve) hours. 60 tablet 0  . morphine (MSIR) 15 MG tablet Take 1 tablet (15 mg total) by mouth every 6 (six) hours as needed for severe pain. 40 tablet 0  . azithromycin (ZITHROMAX) 500 MG tablet Take 1 tablet (500 mg total) by mouth daily for 7 days. 7 tablet 0  . benzonatate (TESSALON) 100 MG capsule Take 1 capsule (100 mg total) by mouth 3 (three) times daily as needed for cough. 21 capsule 0  . tiZANidine (ZANAFLEX) 4 MG tablet Take 1 tablet (4 mg total) by mouth every 6 (six) hours as needed. 60 tablet 1  . HYDROcodone-acetaminophen  (NORCO/VICODIN) 5-325 MG tablet Take 1-2 tablets by mouth every 6 (six) hours as needed. (Patient not taking: Reported on 01/29/2019) 10 tablet 0  . oxyCODONE-acetaminophen (PERCOCET/ROXICET) 5-325 MG tablet Take 1 tablet by mouth every 6 (six) hours as needed for severe pain. (Patient not taking: Reported on 02/03/2019) 12 tablet 0  . predniSONE (DELTASONE) 10 MG tablet 6 tabs po day 1, 5 tabs po day 2, 4 tabs po day 3, 3 tabs po day 4, 2 tabs po day 5, 1 tab po day 6 (Patient not taking: Reported on 01/29/2019) 21 tablet 0   No facility-administered medications prior to visit.     Review of Systems  Constitutional: Positive for malaise/fatigue and weight loss. Negative for chills and fever.  HENT: Negative for hearing loss, sore throat and tinnitus.   Eyes: Negative for blurred vision and double vision.  Respiratory: Positive for cough and shortness of breath. Negative for hemoptysis, sputum production, wheezing and stridor.   Cardiovascular: Negative for chest pain, palpitations, orthopnea, leg swelling and PND.  Gastrointestinal: Negative for abdominal pain, constipation, diarrhea, heartburn, nausea and vomiting.  Genitourinary: Negative for dysuria, hematuria and urgency.  Musculoskeletal: Negative for joint pain and myalgias.  Skin: Negative for itching and rash.  Neurological: Negative for dizziness, tingling, weakness and headaches.  Endo/Heme/Allergies: Negative for environmental allergies. Does not bruise/bleed easily.  Psychiatric/Behavioral: Negative for depression. The patient is not nervous/anxious and does not have insomnia.   All other systems reviewed and are negative.    Objective:  Physical Exam Vitals signs reviewed.  Constitutional:      General: She is not in acute distress.    Appearance: She is well-developed.  HENT:     Head: Normocephalic and atraumatic.  Eyes:     General: No scleral icterus.    Conjunctiva/sclera: Conjunctivae normal.     Pupils: Pupils  are equal, round, and reactive to light.  Neck:     Musculoskeletal: Neck supple.     Vascular: No JVD.     Trachea: No tracheal deviation.  Cardiovascular:     Rate and Rhythm: Normal rate and regular rhythm.     Heart sounds: Normal heart sounds. No murmur.  Pulmonary:     Effort: Pulmonary effort is normal. No tachypnea, accessory muscle usage or respiratory distress.     Breath sounds: No stridor. No wheezing, rhonchi or rales.     Comments: Diminished breath sounds in the right  upper lobe, no wheeze Abdominal:     General: Bowel sounds are normal. There is no distension.     Palpations: Abdomen is soft.     Tenderness: There is no abdominal tenderness.  Musculoskeletal:        General: No tenderness.  Lymphadenopathy:     Cervical: No cervical adenopathy.  Skin:    General: Skin is warm and dry.     Capillary Refill: Capillary refill takes less than 2 seconds.     Findings: No rash.  Neurological:     Mental Status: She is alert and oriented to person, place, and time.  Psychiatric:        Behavior: Behavior normal.      Vitals:   02/04/19 0854  BP: (!) 160/88  Pulse: (!) 102  Temp: 97.8 F (36.6 C)  TempSrc: Oral  SpO2: 93%  Weight: 148 lb (67.1 kg)  Height: 5' (1.524 m)   93% on RA BMI Readings from Last 3 Encounters:  02/04/19 28.90 kg/m  02/03/19 27.93 kg/m  01/29/19 27.93 kg/m   Wt Readings from Last 3 Encounters:  02/04/19 148 lb (67.1 kg)  02/03/19 143 lb (64.9 kg)  01/29/19 143 lb (64.9 kg)     CBC    Component Value Date/Time   WBC 8.5 01/27/2019 2021   RBC 4.85 01/27/2019 2021   HGB 14.1 01/27/2019 2021   HCT 42.2 01/27/2019 2021   PLT 255 01/27/2019 2021   MCV 87.0 01/27/2019 2021   MCH 29.1 01/27/2019 2021   MCHC 33.4 01/27/2019 2021   RDW 13.2 01/27/2019 2021   LYMPHSABS 1.2 01/27/2019 2021   MONOABS 0.5 01/27/2019 2021   EOSABS 0.2 01/27/2019 2021   BASOSABS 0.1 01/27/2019 2021    Chest Imaging: 01/27/2019: CT chest:  Right upper lobe lung mass, large paratracheal and hilar adenopathy. Evidence of fibrosis. The patient's images have been independently reviewed by me.    Pulmonary Functions Testing Results: No flowsheet data found.  FeNO: None   Pathology: None   Echocardiogram: None   Heart Catheterization: None     Assessment & Plan:     ICD-10-CM   1. Mass of upper lobe of right lung  R91.8 Ambulatory referral to Pulmonology  2. Mediastinal adenopathy  R59.0 Ambulatory referral to Pulmonology  3. Hilar adenopathy  R59.0 Ambulatory referral to Pulmonology  4. Former smoker  Z87.891     Discussion: 48 year old female recently diagnosed with a right upper lobe lung mass and mediastinal adenopathy concerning for an advanced stage bronchogenic carcinoma.  Plan: Today in the office we discussed risk benefits and alternatives of proceeding with bronchoscopy.  We will plan for outpatient video bronchoscopy with endobronchial ultrasound and staging of the mediastinum.  Sampling of the paratracheal mass and hilar adenopathy.   We will plan for this to be completed on November 4. We are unable to schedule the patient for this Friday afternoon due to the health system not having rapid Covid testing.  I have been informed that we are only able to complete preop Covid testing with a turnaround time of approximately 72 hours.  Therefore do to availability in the operating room and scheduling our next available slot will be November 4 in the afternoon.  Greater than 50% of this patient 60-minute visit was spent face-to-face discussing the above recommendations and treatment plan.  Review of the risk benefits and alternatives of bronchoscopy as well as review of the patient's CT imaging.    Current Outpatient Medications:  .  albuterol (PROVENTIL HFA;VENTOLIN HFA) 108 (90 Base) MCG/ACT inhaler, Inhale into the lungs., Disp: , Rfl:  .  aspirin EC 325 MG tablet, Take by mouth., Disp: , Rfl:  .   benzonatate (TESSALON) 100 MG capsule, Take 1 capsule (100 mg total) by mouth every 8 (eight) hours., Disp: 21 capsule, Rfl: 0 .  Calcium Carb-Cholecalciferol (CALCIUM-VITAMIN D) 500-200 MG-UNIT tablet, Take by mouth., Disp: , Rfl:  .  ibuprofen (ADVIL) 800 MG tablet, Take 1 tablet (800 mg total) by mouth 3 (three) times daily., Disp: 21 tablet, Rfl: 0 .  morphine (MS CONTIN) 15 MG 12 hr tablet, Take 1 tablet (15 mg total) by mouth every 12 (twelve) hours., Disp: 60 tablet, Rfl: 0 .  morphine (MSIR) 15 MG tablet, Take 1 tablet (15 mg total) by mouth every 6 (six) hours as needed for severe pain., Disp: 40 tablet, Rfl: 0   Garner Nash, DO  Pulmonary Critical Care 02/04/2019 9:29 AM

## 2019-02-05 ENCOUNTER — Encounter: Payer: Self-pay | Admitting: General Practice

## 2019-02-05 NOTE — Progress Notes (Signed)
[facility] Psychosocial Distress Screening Clinical Social Work  Clinical Social Work was referred by distress screening protocol.  The patient scored a 7 on the Psychosocial Distress Thermometer which indicates moderate distress. Clinical Social Worker contacted patient by phone to assess for distress and other psychosocial needs. Has completed Medicaid prescreen online, CSW will send her Epass application.  Works in customer service, anticipates being out of work indefinitely.  Is getting help through donations from family members and coworkers.  Rents from family members, has 18 year old daughter.  Is new to diagnosis of cancer, does not have detailed treatment plan or understanding of stage of cancer yet.  Referred to [facility] for gas card program, she has already met w [facility] Financial Advocate.   Emailed packet of information on Support Center programming.    ONCBCN DISTRESS SCREENING 02/03/2019  Screening Type Initial Screening  Distress experienced in past week (1-10) 7  Emotional problem type Nervousness/Anxiety;Adjusting to illness  Other Contact via Phone    Clinical Social Worker follow up needed: No.  If yes, follow up plan:  Anne C Cunningham, LCSW   Anne Cunningham, LCSW Clinical Social Worker Phone:  [phone] Cell:  [phone] 

## 2019-02-06 ENCOUNTER — Other Ambulatory Visit: Payer: Self-pay

## 2019-02-06 ENCOUNTER — Ambulatory Visit (HOSPITAL_COMMUNITY)
Admission: RE | Admit: 2019-02-06 | Discharge: 2019-02-06 | Disposition: A | Discharge status: Home / Self Care | Payer: Medicaid Other | Source: Ambulatory Visit | Attending: Hematology | Admitting: Hematology

## 2019-02-06 ENCOUNTER — Encounter: Payer: Self-pay | Admitting: *Deleted

## 2019-02-06 DIAGNOSIS — C3490 Malignant neoplasm of unspecified part of unspecified bronchus or lung: Secondary | ICD-10-CM | POA: Diagnosis not present

## 2019-02-06 LAB — GLUCOSE, CAPILLARY: Glucose-Capillary: 89 mg/dL (ref 70–99)

## 2019-02-06 MED ORDER — FLUDEOXYGLUCOSE F - 18 (FDG) INJECTION
7.4100 | Freq: Once | INTRAVENOUS | Status: AC
  Administered 2019-02-06: 7.41 via INTRAVENOUS

## 2019-02-07 ENCOUNTER — Ambulatory Visit (HOSPITAL_BASED_OUTPATIENT_CLINIC_OR_DEPARTMENT_OTHER)
Admission: RE | Admit: 2019-02-07 | Discharge: 2019-02-07 | Disposition: A | Discharge status: Home / Self Care | Payer: Medicaid Other | Source: Ambulatory Visit | Attending: Radiation Oncology | Admitting: Radiation Oncology

## 2019-02-07 DIAGNOSIS — C3490 Malignant neoplasm of unspecified part of unspecified bronchus or lung: Secondary | ICD-10-CM | POA: Diagnosis present

## 2019-02-07 MED ORDER — GADOPENTETATE DIMEGLUMINE 469.01 MG/ML IV SOLN
7.0000 mL | Freq: Once | INTRAVENOUS | Status: AC | PRN
  Administered 2019-02-07: 11:00:00 7 mL via INTRAVENOUS

## 2019-02-09 ENCOUNTER — Other Ambulatory Visit: Payer: Self-pay | Admitting: Hematology

## 2019-02-09 ENCOUNTER — Encounter: Payer: Self-pay | Admitting: *Deleted

## 2019-02-09 ENCOUNTER — Other Ambulatory Visit (HOSPITAL_COMMUNITY)
Admission: RE | Admit: 2019-02-09 | Discharge: 2019-02-09 | Disposition: A | Discharge status: Home / Self Care | Payer: Medicaid Other | Source: Ambulatory Visit | Attending: Pulmonary Disease | Admitting: Pulmonary Disease

## 2019-02-09 ENCOUNTER — Telehealth: Payer: Self-pay | Admitting: Radiation Oncology

## 2019-02-09 ENCOUNTER — Telehealth: Payer: Self-pay | Admitting: Hematology

## 2019-02-09 DIAGNOSIS — R918 Other nonspecific abnormal finding of lung field: Secondary | ICD-10-CM

## 2019-02-09 DIAGNOSIS — Z01812 Encounter for preprocedural laboratory examination: Secondary | ICD-10-CM | POA: Insufficient documentation

## 2019-02-09 DIAGNOSIS — Z20828 Contact with and (suspected) exposure to other viral communicable diseases: Secondary | ICD-10-CM | POA: Diagnosis not present

## 2019-02-09 NOTE — Telephone Encounter (Signed)
I spoke with the patient and let her know that the results from her Brain MRI and PET were encouraging for stage III disease. She will come in for simulation on Wednesday this week, and Dr. Maylon Peppers would like to start the week of 11/16. Bx is still pending but will result hopefully early next week.

## 2019-02-09 NOTE — Telephone Encounter (Signed)
I called and spoke with patient regading her appointment being rescheduled per 11/2 sch msg.  She was ok with both date/time

## 2019-02-09 NOTE — Progress Notes (Signed)
Patient reached out to question her follow up appointment via My Chart. Confirmed with Dr Maylon Peppers and we will move her follow up to after her biopsy. Dr Maylon Peppers would also like to get her port placed while waiting for results as imaging seems to show an at least stage 3 lung cancer.  Informed patient that IR would be calling her to schedule her port. Reminded her that port information had been included in her new patient packet. She confirmed that she had seen it, and was familiar with port and their purpose. She did not have any questions for me.   Order for port placed. Reached out to Tiffany in IR to get the port scheduled between her biopsy on 11/4 and her follow up with Dr Maylon Peppers on 11/10.  Port scheduled for 02/18/19.

## 2019-02-10 ENCOUNTER — Encounter: Payer: Self-pay | Admitting: *Deleted

## 2019-02-10 ENCOUNTER — Ambulatory Visit: Payer: Self-pay | Admitting: Hematology

## 2019-02-10 ENCOUNTER — Other Ambulatory Visit: Payer: Self-pay

## 2019-02-10 ENCOUNTER — Other Ambulatory Visit: Payer: Self-pay | Admitting: *Deleted

## 2019-02-10 DIAGNOSIS — R918 Other nonspecific abnormal finding of lung field: Secondary | ICD-10-CM

## 2019-02-10 LAB — NOVEL CORONAVIRUS, NAA (HOSP ORDER, SEND-OUT TO REF LAB; TAT 18-24 HRS): SARS-CoV-2, NAA: NOT DETECTED

## 2019-02-10 NOTE — Progress Notes (Signed)
Patient expressing concerns about her ability to have transportation to Radiation Oncology appointments daily for 4-6 weeks in [place]. She would like her radiation moved to a location in [place].   Spoke to Dr Maylon Peppers, and received the okay to send a referral to a closer location. Order for Referral to [facility] in [facility] sent. Called and spoke to [place] at [facility], requesting an urgent referral. Their center prefers to wait until they have confirmed tissue but they may be able to see the patient at the end of the week to get things started.   Message sent to scheduler to send referral. Will also send a message to radiology for them to forward films to [facility].

## 2019-02-11 ENCOUNTER — Ambulatory Visit: Payer: Self-pay | Admitting: Radiation Oncology

## 2019-02-11 ENCOUNTER — Other Ambulatory Visit: Payer: Self-pay

## 2019-02-11 ENCOUNTER — Other Ambulatory Visit: Payer: Self-pay | Admitting: Family

## 2019-02-11 ENCOUNTER — Encounter (HOSPITAL_COMMUNITY): Payer: Self-pay | Admitting: *Deleted

## 2019-02-11 NOTE — Progress Notes (Signed)
Patient denies shortness of breath, fever, cough and chest pain.  PCP - Denies Cardiologist - Denies Oncology - Dr Maylon Peppers  Chest x-ray - 01/27/19 EKG - 01/28/19 Stress Test - denies ECHO - denies Cardiac Cath - denies  Aspirin Instructions: Follow your surgeon's instructions on when to stop aspirin prior to surgery,  If no instructions were given by your surgeon then you will need to call the office for those instructions.  Anesthesia review: Nio  STOP now taking any Aspirin (unless otherwise instructed by your surgeon), Aleve, Naproxen, Ibuprofen, Motrin, Advil, Goody's, BC's, all herbal medications, fish oil, and all vitamins.   Coronavirus Screening Covid test on 02/09/19 was negative.  Patient verbalized understanding of instructions that were given via phone.

## 2019-02-12 ENCOUNTER — Ambulatory Visit (HOSPITAL_COMMUNITY)
Admission: RE | Admit: 2019-02-12 | Discharge: 2019-02-12 | Disposition: A | Discharge status: Home / Self Care | Payer: Medicaid Other | Attending: Pulmonary Disease | Admitting: Pulmonary Disease

## 2019-02-12 ENCOUNTER — Ambulatory Visit (HOSPITAL_COMMUNITY): Payer: Medicaid Other | Admitting: Anesthesiology

## 2019-02-12 ENCOUNTER — Encounter (HOSPITAL_COMMUNITY)
Admission: RE | Disposition: A | Discharge status: Home / Self Care | Payer: Self-pay | Source: Home / Self Care | Attending: Pulmonary Disease

## 2019-02-12 ENCOUNTER — Encounter (HOSPITAL_COMMUNITY): Payer: Self-pay

## 2019-02-12 ENCOUNTER — Other Ambulatory Visit: Payer: Self-pay

## 2019-02-12 DIAGNOSIS — Z87891 Personal history of nicotine dependence: Secondary | ICD-10-CM | POA: Insufficient documentation

## 2019-02-12 DIAGNOSIS — R590 Localized enlarged lymph nodes: Secondary | ICD-10-CM | POA: Insufficient documentation

## 2019-02-12 DIAGNOSIS — Z79891 Long term (current) use of opiate analgesic: Secondary | ICD-10-CM | POA: Insufficient documentation

## 2019-02-12 DIAGNOSIS — I10 Essential (primary) hypertension: Secondary | ICD-10-CM | POA: Insufficient documentation

## 2019-02-12 DIAGNOSIS — C3411 Malignant neoplasm of upper lobe, right bronchus or lung: Secondary | ICD-10-CM | POA: Diagnosis not present

## 2019-02-12 DIAGNOSIS — J439 Emphysema, unspecified: Secondary | ICD-10-CM | POA: Insufficient documentation

## 2019-02-12 DIAGNOSIS — Z791 Long term (current) use of non-steroidal anti-inflammatories (NSAID): Secondary | ICD-10-CM | POA: Diagnosis not present

## 2019-02-12 DIAGNOSIS — Z801 Family history of malignant neoplasm of trachea, bronchus and lung: Secondary | ICD-10-CM | POA: Insufficient documentation

## 2019-02-12 DIAGNOSIS — Z79899 Other long term (current) drug therapy: Secondary | ICD-10-CM | POA: Insufficient documentation

## 2019-02-12 DIAGNOSIS — G8929 Other chronic pain: Secondary | ICD-10-CM | POA: Diagnosis not present

## 2019-02-12 DIAGNOSIS — R599 Enlarged lymph nodes, unspecified: Secondary | ICD-10-CM

## 2019-02-12 DIAGNOSIS — C3490 Malignant neoplasm of unspecified part of unspecified bronchus or lung: Secondary | ICD-10-CM | POA: Diagnosis present

## 2019-02-12 DIAGNOSIS — Z7982 Long term (current) use of aspirin: Secondary | ICD-10-CM | POA: Diagnosis not present

## 2019-02-12 DIAGNOSIS — R918 Other nonspecific abnormal finding of lung field: Secondary | ICD-10-CM

## 2019-02-12 DIAGNOSIS — R591 Generalized enlarged lymph nodes: Secondary | ICD-10-CM

## 2019-02-12 HISTORY — DX: Localized enlarged lymph nodes: R59.0

## 2019-02-12 HISTORY — PX: VIDEO BRONCHOSCOPY WITH ENDOBRONCHIAL ULTRASOUND: SHX6177

## 2019-02-12 HISTORY — DX: Emphysema, unspecified: J43.9

## 2019-02-12 HISTORY — DX: Essential (primary) hypertension: I10

## 2019-02-12 HISTORY — DX: Nicotine dependence, unspecified, uncomplicated: F17.200

## 2019-02-12 HISTORY — PX: LUNG BIOPSY: SHX5088

## 2019-02-12 HISTORY — DX: Depression, unspecified: F32.A

## 2019-02-12 HISTORY — DX: Other nonspecific abnormal finding of lung field: R91.8

## 2019-02-12 LAB — CBC
HCT: 42.1 % (ref 36.0–46.0)
Hemoglobin: 14.1 g/dL (ref 12.0–15.0)
MCH: 29.1 pg (ref 26.0–34.0)
MCHC: 33.5 g/dL (ref 30.0–36.0)
MCV: 87 fL (ref 80.0–100.0)
Platelets: 296 10*3/uL (ref 150–400)
RBC: 4.84 MIL/uL (ref 3.87–5.11)
RDW: 12.7 % (ref 11.5–15.5)
WBC: 5.8 10*3/uL (ref 4.0–10.5)
nRBC: 0 % (ref 0.0–0.2)

## 2019-02-12 LAB — PROTIME-INR
INR: 0.9 (ref 0.8–1.2)
Prothrombin Time: 12.4 seconds (ref 11.4–15.2)

## 2019-02-12 LAB — BODY FLUID CELL COUNT WITH DIFFERENTIAL
Eos, Fluid: 0 %
Lymphs, Fluid: 9 %
Monocyte-Macrophage-Serous Fluid: 66 % (ref 50–90)
Neutrophil Count, Fluid: 25 % (ref 0–25)
Total Nucleated Cell Count, Fluid: 278 uL (ref 0–1000)

## 2019-02-12 LAB — COMPREHENSIVE METABOLIC PANEL
ALT: 30 U/L (ref 0–44)
AST: 23 U/L (ref 15–41)
Albumin: 3.6 g/dL (ref 3.5–5.0)
Alkaline Phosphatase: 93 U/L (ref 38–126)
Anion gap: 9 (ref 5–15)
BUN: 13 mg/dL (ref 6–20)
CO2: 22 mmol/L (ref 22–32)
Calcium: 9.5 mg/dL (ref 8.9–10.3)
Chloride: 105 mmol/L (ref 98–111)
Creatinine, Ser: 0.89 mg/dL (ref 0.44–1.00)
GFR calc Af Amer: >60 mL/min (ref >60)
GFR calc non Af Amer: >60 mL/min (ref >60)
Glucose, Bld: 91 mg/dL (ref 70–99)
Potassium: 4 mmol/L (ref 3.5–5.1)
Sodium: 136 mmol/L (ref 135–145)
Total Bilirubin: 0.6 mg/dL (ref 0.3–1.2)
Total Protein: 8.4 g/dL — ABNORMAL HIGH (ref 6.5–8.1)

## 2019-02-12 LAB — APTT: aPTT: 37 seconds — ABNORMAL HIGH (ref 24–36)

## 2019-02-12 SURGERY — BRONCHOSCOPY, WITH EBUS
Anesthesia: General | Laterality: Right

## 2019-02-12 MED ORDER — LIDOCAINE 2% (20 MG/ML) 5 ML SYRINGE
INTRAMUSCULAR | Status: AC
  Filled 2019-02-12: qty 5

## 2019-02-12 MED ORDER — PROPOFOL 10 MG/ML IV BOLUS
INTRAVENOUS | Status: DC | PRN
  Administered 2019-02-12: 170 mg via INTRAVENOUS

## 2019-02-12 MED ORDER — ROCURONIUM BROMIDE 10 MG/ML (PF) SYRINGE
PREFILLED_SYRINGE | INTRAVENOUS | Status: DC | PRN
  Administered 2019-02-12: 50 mg via INTRAVENOUS

## 2019-02-12 MED ORDER — FENTANYL CITRATE (PF) 250 MCG/5ML IJ SOLN
INTRAMUSCULAR | Status: AC
  Filled 2019-02-12: qty 5

## 2019-02-12 MED ORDER — MIDAZOLAM HCL 5 MG/5ML IJ SOLN
INTRAMUSCULAR | Status: DC | PRN
  Administered 2019-02-12: 2 mg via INTRAVENOUS

## 2019-02-12 MED ORDER — ONDANSETRON HCL 4 MG/2ML IJ SOLN
INTRAMUSCULAR | Status: DC | PRN
  Administered 2019-02-12: 4 mg via INTRAVENOUS

## 2019-02-12 MED ORDER — MIDAZOLAM HCL 2 MG/2ML IJ SOLN
INTRAMUSCULAR | Status: AC
  Filled 2019-02-12: qty 2

## 2019-02-12 MED ORDER — ONDANSETRON HCL 4 MG/2ML IJ SOLN
INTRAMUSCULAR | Status: AC
  Filled 2019-02-12: qty 2

## 2019-02-12 MED ORDER — PROPOFOL 10 MG/ML IV BOLUS
INTRAVENOUS | Status: AC
  Filled 2019-02-12: qty 20

## 2019-02-12 MED ORDER — LACTATED RINGERS IV SOLN
INTRAVENOUS | Status: DC
  Administered 2019-02-12: 12:00:00 via INTRAVENOUS

## 2019-02-12 MED ORDER — SUGAMMADEX SODIUM 200 MG/2ML IV SOLN
INTRAVENOUS | Status: DC | PRN
  Administered 2019-02-12: 150 mg via INTRAVENOUS

## 2019-02-12 MED ORDER — PHENYLEPHRINE HCL-NACL 10-0.9 MG/250ML-% IV SOLN
INTRAVENOUS | Status: DC | PRN
  Administered 2019-02-12: 10 ug/min via INTRAVENOUS

## 2019-02-12 MED ORDER — FENTANYL CITRATE (PF) 100 MCG/2ML IJ SOLN
INTRAMUSCULAR | Status: DC | PRN
  Administered 2019-02-12: 100 ug via INTRAVENOUS

## 2019-02-12 MED ORDER — DEXAMETHASONE SODIUM PHOSPHATE 10 MG/ML IJ SOLN
INTRAMUSCULAR | Status: DC | PRN
  Administered 2019-02-12: 10 mg via INTRAVENOUS

## 2019-02-12 MED ORDER — DEXAMETHASONE SODIUM PHOSPHATE 10 MG/ML IJ SOLN
INTRAMUSCULAR | Status: AC
  Filled 2019-02-12: qty 1

## 2019-02-12 MED ORDER — LIDOCAINE 2% (20 MG/ML) 5 ML SYRINGE
INTRAMUSCULAR | Status: DC | PRN
  Administered 2019-02-12: 60 mg via INTRAVENOUS

## 2019-02-12 MED ORDER — PHENYLEPHRINE HCL (PRESSORS) 10 MG/ML IV SOLN
INTRAVENOUS | Status: AC
  Filled 2019-02-12: qty 1

## 2019-02-12 SURGICAL SUPPLY — 35 items
ADAPTER VALVE BIOPSY EBUS (MISCELLANEOUS) IMPLANT
ADPTR VALVE BIOPSY EBUS (MISCELLANEOUS)
BRUSH CYTOL CELLEBRITY 1.5X140 (MISCELLANEOUS) IMPLANT
CANISTER SUCT 3000ML PPV (MISCELLANEOUS) ×2 IMPLANT
CONT SPEC 4OZ CLIKSEAL STRL BL (MISCELLANEOUS) ×2 IMPLANT
COVER BACK TABLE 60X90IN (DRAPES) ×2 IMPLANT
FILTER STRAW FLUID ASPIR (MISCELLANEOUS) IMPLANT
FORCEPS BIOP RJ4 1.8 (CUTTING FORCEPS) IMPLANT
GAUZE SPONGE 4X4 12PLY STRL (GAUZE/BANDAGES/DRESSINGS) ×2 IMPLANT
GLOVE SURG SS PI 7.5 STRL IVOR (GLOVE) ×4 IMPLANT
GOWN STRL REUS W/ TWL LRG LVL3 (GOWN DISPOSABLE) ×2 IMPLANT
GOWN STRL REUS W/TWL LRG LVL3 (GOWN DISPOSABLE) ×2
KIT CLEAN ENDO COMPLIANCE (KITS) ×4 IMPLANT
KIT TURNOVER KIT B (KITS) ×2 IMPLANT
MARKER SKIN DUAL TIP RULER LAB (MISCELLANEOUS) ×2 IMPLANT
NDL ASPIRATION VIZISHOT 19G (NEEDLE) IMPLANT
NDL ASPIRATION VIZISHOT 21G (NEEDLE) IMPLANT
NEEDLE ASPIRATION VIZISHOT 19G (NEEDLE) ×2 IMPLANT
NEEDLE ASPIRATION VIZISHOT 21G (NEEDLE) IMPLANT
NS IRRIG 1000ML POUR BTL (IV SOLUTION) ×2 IMPLANT
OIL SILICONE PENTAX (PARTS (SERVICE/REPAIRS)) ×2 IMPLANT
PAD ARMBOARD 7.5X6 YLW CONV (MISCELLANEOUS) ×4 IMPLANT
STOPCOCK 4 WAY LG BORE MALE ST (IV SETS) ×2 IMPLANT
SYR 20ML ECCENTRIC (SYRINGE) ×6 IMPLANT
SYR 20ML LL LF (SYRINGE) ×2 IMPLANT
SYR 3ML LL SCALE MARK (SYRINGE) IMPLANT
SYR 50ML SLIP (SYRINGE) ×2 IMPLANT
SYR 5ML LL (SYRINGE) ×8 IMPLANT
TRAP SPECIMEN MUCOUS 40CC (MISCELLANEOUS) IMPLANT
TUBE CONNECTING 20X1/4 (TUBING) ×2 IMPLANT
TUBING EXTENTION W/L.L. (IV SETS) ×2 IMPLANT
VALVE BIOPSY  SINGLE USE (MISCELLANEOUS) ×1
VALVE BIOPSY SINGLE USE (MISCELLANEOUS) ×1 IMPLANT
VALVE SUCTION BRONCHIO DISP (MISCELLANEOUS) ×2 IMPLANT
WATER STERILE IRR 1000ML POUR (IV SOLUTION) ×2 IMPLANT

## 2019-02-12 NOTE — Discharge Instructions (Signed)
Flexible Bronchoscopy, Care After This sheet gives you information about how to care for yourself after your test. Your doctor may also give you more specific instructions. If you have problems or questions, contact your doctor. Follow these instructions at home: Eating and drinking  The day after the test, go back to your normal diet. Driving  Do not drive for 24 hours if you were given a medicine to help you relax (sedative).  Do not drive or use heavy machinery while taking prescription pain medicine. General instructions   Take over-the-counter and prescription medicines only as told by your doctor.  Return to your normal activities as told. Ask what activities are safe for you.  Do not use any products that have nicotine or tobacco in them. This includes cigarettes and e-cigarettes. If you need help quitting, ask your doctor.  Keep all follow-up visits as told by your doctor. This is important. It is very important if you had a tissue sample (biopsy) taken. Get help right away if:  You have shortness of breath that gets worse.  You get light-headed.  You feel like you are going to pass out (faint).  You have chest pain.  You cough up: ? More than a little blood. ? More blood than before. Summary  Do not eat or drink anything (not even water) for 2 hours after your test, or until your numbing medicine wears off.  Do not use cigarettes. Do not use e-cigarettes.  Get help right away if you have chest pain. This information is not intended to replace advice given to you by your health care provider. Make sure you discuss any questions you have with your health care provider. Document Released: 01/21/2009 Document Revised: 03/08/2017 Document Reviewed: 04/13/2016 Elsevier Patient Education  2020 [employer].

## 2019-02-12 NOTE — Interval H&P Note (Signed)
History and Physical Interval Note:  02/12/2019 12:46 PM  Marcia King  has presented today for surgery, with the diagnosis of RIGHT UPPER LOBE LUNG MASS.  The various methods of treatment have been discussed with the patient and family. After consideration of risks, benefits and other options for treatment, the patient has consented to  Procedure(s): VIDEO BRONCHOSCOPY WITH ENDOBRONCHIAL ULTRASOUND (Right) LUNG BIOPSY (Right) as a surgical intervention.  The patient's history has been reviewed, patient examined, no change in status, stable for surgery.  I have reviewed the patient's chart and labs.  Questions were answered to the patient's satisfaction.    Patient seen in pre-op. Risks benefits and alternatives discussed. No barriers to proceed.   [place]

## 2019-02-12 NOTE — Transfer of Care (Signed)
Immediate Anesthesia Transfer of Care Note  Patient: Marcia King  Procedure(s) Performed: VIDEO BRONCHOSCOPY WITH ENDOBRONCHIAL ULTRASOUND (Right ) LUNG BIOPSY (Right )  Patient Location: PACU  Anesthesia Type:General  Level of Consciousness: awake, oriented and patient cooperative  Airway & Oxygen Therapy: Patient Spontanous Breathing and Patient connected to face mask oxygen  Post-op Assessment: Report given to RN and Post -op Vital signs reviewed and stable  Post vital signs: Reviewed  Last Vitals:  Vitals Value Taken Time  BP 141/92 02/12/19 1410  Temp    Pulse 97 02/12/19 1414  Resp 17 02/12/19 1414  SpO2 91 % 02/12/19 1414  Vitals shown include unvalidated device data.  Last Pain:  Vitals:   02/12/19 1144  TempSrc:   PainSc: 0-No pain      Patients Stated Pain Goal: 4 (60/67/70 3403)  Complications: No apparent anesthesia complications

## 2019-02-12 NOTE — Anesthesia Postprocedure Evaluation (Signed)
Anesthesia Post Note  Patient: Marcia King  Procedure(s) Performed: VIDEO BRONCHOSCOPY WITH ENDOBRONCHIAL ULTRASOUND (Right ) LUNG BIOPSY (Right )     Patient location during evaluation: PACU Anesthesia Type: General Level of consciousness: awake and alert Pain management: pain level controlled Vital Signs Assessment: post-procedure vital signs reviewed and stable Respiratory status: spontaneous breathing, nonlabored ventilation, respiratory function stable and patient connected to nasal cannula oxygen Cardiovascular status: blood pressure returned to baseline and stable Postop Assessment: no apparent nausea or vomiting Anesthetic complications: no    Last Vitals:  Vitals:   02/12/19 1448 02/12/19 1456  BP: 120/76   Pulse: 92 96  Resp: 12 18  Temp:  (!) 36 C  SpO2: 90% 100%    Last Pain:  Vitals:   02/12/19 1456  TempSrc:   PainSc: 0-No pain                 Jamyah Folk COKER

## 2019-02-12 NOTE — Anesthesia Procedure Notes (Signed)
Procedure Name: Intubation Date/Time: 02/12/2019 1:01 PM Performed by: Jenne Campus, CRNA Pre-anesthesia Checklist: Patient identified, Emergency Drugs available, Suction available and Patient being monitored Patient Re-evaluated:Patient Re-evaluated prior to induction Oxygen Delivery Method: Circle System Utilized Preoxygenation: Pre-oxygenation with 100% oxygen Induction Type: IV induction Ventilation: Mask ventilation without difficulty Laryngoscope Size: Miller and 2 Grade View: Grade I Tube type: Oral Tube size: 8.5 mm Number of attempts: 1 Airway Equipment and Method: Stylet and Oral airway Placement Confirmation: ETT inserted through vocal cords under direct vision,  positive ETCO2 and breath sounds checked- equal and bilateral Secured at: 21 cm Tube secured with: Tape Dental Injury: Teeth and Oropharynx as per pre-operative assessment

## 2019-02-12 NOTE — Op Note (Signed)
Video Bronchoscopy with Endobronchial Ultrasound Procedure Note  Date of Operation: 02/12/2019  Pre-op Diagnosis: Mediastinal adenopathy, right hilar lung mass  Post-op Diagnosis: Mediastinal adenopathy, right hilar lung mass  Surgeon: Garner Nash, DO   Assistants: None   Anesthesia: General endotracheal anesthesia  Operation: Flexible video fiberoptic bronchoscopy with endobronchial ultrasound and biopsies.  Estimated Blood Loss: Minimal, <7JI  Complications: None   Indications and History: Marcia King is a 48 y.o. female with mediastinal adenopathy, right hilar lung mass.  The risks, benefits, complications, treatment options and expected outcomes were discussed with the patient.  The possibilities of pneumothorax, pneumonia, reaction to medication, pulmonary aspiration, perforation of a viscus, bleeding, failure to diagnose a condition and creating a complication requiring transfusion or operation were discussed with the patient who freely signed the consent.    Description of Procedure: The patient was examined in the preoperative area and history and data from the preprocedure consultation were reviewed. It was deemed appropriate to proceed.  The patient was taken to [facility] OR 11, identified as Buford Dresser and the procedure verified as Flexible Video Fiberoptic Bronchoscopy.  A Time Out was held and the above information confirmed. After being taken to the operating room general anesthesia was initiated and the patient  was orally intubated. The video fiberoptic bronchoscope was introduced via the endotracheal tube and a general inspection was performed which showed normal left-sided lung anatomy, right-sided lung anatomy with extrinsic compression and narrowing of the anterior segment of the right upper lobe, splaying of the right main carina, normal right lower lobe and right middle lobe lung anatomy.  No evidence of endobronchial tumor. The standard scope was then withdrawn and the  endobronchial ultrasound was used to identify and characterize the peritracheal, hilar and bronchial lymph nodes. Inspection showed large greater than 4 cm cross-section mass within the right hilum extending up along the right anterior mainstem and lower right trachea, enlarged greater than 2 cm station 7 lymph node. Using real-time ultrasound guidance transbronchial needle biopsies were take from, right upper lobe hilar mass, Station 7 node and were sent for cytology.  A BAL with 120 cc of saline and moderate turn was completed from the right upper lobe to be sent for culture and cytology. The patient tolerated the procedure well without apparent complications. There was no significant blood loss. The bronchoscope was withdrawn. Anesthesia was reversed and the patient was taken to the PACU for recovery.   Samples: 1. Wang needle biopsies from right upper lobe and hilar mass 2. Wang needle biopsies from station 7 node 3.  BAL from the right upper lobe  Plans:  The patient will be discharged from the PACU to home when recovered from anesthesia. We will review the cytology, pathology and microbiology results with the patient when they become available. Outpatient followup will be with Octavio Graves Kaisyn Millea, DO.   Preliminary pathology: Adequate for tissue diagnosis.  Garner Nash, DO [facility] 02/12/2019 2:02 PM

## 2019-02-12 NOTE — Anesthesia Preprocedure Evaluation (Addendum)
Anesthesia Evaluation  Patient identified by MRN, date of birth, ID band Patient awake    Reviewed: Allergy & Precautions, NPO status , Patient's Chart, lab work & pertinent test results  Airway Mallampati: II  TM Distance: >3 FB Neck ROM: Full    Dental  (+) Edentulous Upper, Edentulous Lower, Dental Advisory Given   Pulmonary former smoker,    breath sounds clear to auscultation       Cardiovascular hypertension,  Rhythm:Regular Rate:Normal     Neuro/Psych    GI/Hepatic   Endo/Other    Renal/GU      Musculoskeletal   Abdominal   Peds  Hematology   Anesthesia Other Findings   Reproductive/Obstetrics                            Anesthesia Physical Anesthesia Plan  ASA: III  Anesthesia Plan: General   Post-op Pain Management:    Induction: Intravenous  PONV Risk Score and Plan: Ondansetron and Dexamethasone  Airway Management Planned:   Additional Equipment:   Intra-op Plan:   Post-operative Plan: Extubation in OR  Informed Consent: I have reviewed the patients History and Physical, chart, labs and discussed the procedure including the risks, benefits and alternatives for the proposed anesthesia with the patient or authorized representative who has indicated his/her understanding and acceptance.       Plan Discussed with: CRNA and Anesthesiologist  Anesthesia Plan Comments:         Anesthesia Quick Evaluation

## 2019-02-13 ENCOUNTER — Encounter (HOSPITAL_COMMUNITY): Payer: Self-pay | Admitting: Pulmonary Disease

## 2019-02-13 ENCOUNTER — Encounter: Payer: Self-pay | Admitting: General Practice

## 2019-02-13 ENCOUNTER — Telehealth: Payer: Self-pay | Admitting: Pulmonary Disease

## 2019-02-13 LAB — CYTOLOGY - NON PAP

## 2019-02-13 NOTE — Telephone Encounter (Signed)
Called Dr. Karl Bales office and spoke with Jenny Reichmann letting her know the info stated by Dr.Icard and that the results are not finalized yet. Stated to her as soon as we had all the results we would fax them to the provided fax number.  Will hold in triage so we can keep an eye out on the bronch and path report.

## 2019-02-13 NOTE — Telephone Encounter (Signed)
Pathology report is not yet finalized Likely not returning until Monday or Tuesday Thanks Garner Nash, DO [facility] 02/13/2019 3:09 PM

## 2019-02-13 NOTE — Progress Notes (Signed)
[place] [facility] CSW Progress Notes  [employer] has approved patient for gas card - they will mail to patient at home address.  Patient infomred by phone.  Edwyna Shell, LCSW Clinical Social Worker Phone:  [phone]

## 2019-02-13 NOTE — Telephone Encounter (Signed)
Spoke with Jocelyn Lamer in IR and advised her that I would fax over the information as soon as Dr. Valeta Harms reviews them. I see one cytology report but unsure on the the procedure note for the bronch. Please advise.

## 2019-02-14 LAB — CULTURE, RESPIRATORY W GRAM STAIN: Culture: NORMAL

## 2019-02-16 ENCOUNTER — Encounter: Payer: Self-pay | Admitting: *Deleted

## 2019-02-17 ENCOUNTER — Inpatient Hospital Stay: Payer: Medicaid Other | Attending: Hematology

## 2019-02-17 ENCOUNTER — Inpatient Hospital Stay (HOSPITAL_BASED_OUTPATIENT_CLINIC_OR_DEPARTMENT_OTHER): Payer: Medicaid Other | Admitting: Hematology

## 2019-02-17 ENCOUNTER — Other Ambulatory Visit: Payer: Self-pay

## 2019-02-17 ENCOUNTER — Other Ambulatory Visit: Payer: Self-pay | Admitting: Radiology

## 2019-02-17 ENCOUNTER — Encounter: Payer: Self-pay | Admitting: Hematology

## 2019-02-17 VITALS — BP 150/92 | HR 90 | Temp 97.7°F | Resp 18 | Ht 60.0 in | Wt 143.4 lb

## 2019-02-17 DIAGNOSIS — R079 Chest pain, unspecified: Secondary | ICD-10-CM | POA: Diagnosis not present

## 2019-02-17 DIAGNOSIS — Z79899 Other long term (current) drug therapy: Secondary | ICD-10-CM | POA: Diagnosis not present

## 2019-02-17 DIAGNOSIS — G893 Neoplasm related pain (acute) (chronic): Secondary | ICD-10-CM

## 2019-02-17 DIAGNOSIS — Z87891 Personal history of nicotine dependence: Secondary | ICD-10-CM | POA: Diagnosis not present

## 2019-02-17 DIAGNOSIS — R0602 Shortness of breath: Secondary | ICD-10-CM | POA: Diagnosis not present

## 2019-02-17 DIAGNOSIS — Z791 Long term (current) use of non-steroidal anti-inflammatories (NSAID): Secondary | ICD-10-CM | POA: Diagnosis not present

## 2019-02-17 DIAGNOSIS — R918 Other nonspecific abnormal finding of lung field: Secondary | ICD-10-CM

## 2019-02-17 DIAGNOSIS — R05 Cough: Secondary | ICD-10-CM | POA: Diagnosis not present

## 2019-02-17 DIAGNOSIS — C3411 Malignant neoplasm of upper lobe, right bronchus or lung: Secondary | ICD-10-CM | POA: Diagnosis present

## 2019-02-17 DIAGNOSIS — C3481 Malignant neoplasm of overlapping sites of right bronchus and lung: Secondary | ICD-10-CM

## 2019-02-17 DIAGNOSIS — C3490 Malignant neoplasm of unspecified part of unspecified bronchus or lung: Secondary | ICD-10-CM

## 2019-02-17 DIAGNOSIS — J439 Emphysema, unspecified: Secondary | ICD-10-CM | POA: Diagnosis not present

## 2019-02-17 DIAGNOSIS — Z5111 Encounter for antineoplastic chemotherapy: Secondary | ICD-10-CM | POA: Insufficient documentation

## 2019-02-17 DIAGNOSIS — Z7982 Long term (current) use of aspirin: Secondary | ICD-10-CM | POA: Insufficient documentation

## 2019-02-17 DIAGNOSIS — R7989 Other specified abnormal findings of blood chemistry: Secondary | ICD-10-CM | POA: Insufficient documentation

## 2019-02-17 LAB — CBC WITH DIFFERENTIAL (CANCER CENTER ONLY)
Abs Immature Granulocytes: 0.04 10*3/uL (ref 0.00–0.07)
Basophils Absolute: 0.1 10*3/uL (ref 0.0–0.1)
Basophils Relative: 1 %
Eosinophils Absolute: 0.4 10*3/uL (ref 0.0–0.5)
Eosinophils Relative: 7 %
HCT: 38.9 % (ref 36.0–46.0)
Hemoglobin: 13.1 g/dL (ref 12.0–15.0)
Immature Granulocytes: 1 %
Lymphocytes Relative: 26 %
Lymphs Abs: 1.4 10*3/uL (ref 0.7–4.0)
MCH: 28.9 pg (ref 26.0–34.0)
MCHC: 33.7 g/dL (ref 30.0–36.0)
MCV: 85.7 fL (ref 80.0–100.0)
Monocytes Absolute: 0.5 10*3/uL (ref 0.1–1.0)
Monocytes Relative: 9 %
Neutro Abs: 3.1 10*3/uL (ref 1.7–7.7)
Neutrophils Relative %: 56 %
Platelet Count: 275 10*3/uL (ref 150–400)
RBC: 4.54 MIL/uL (ref 3.87–5.11)
RDW: 12.7 % (ref 11.5–15.5)
WBC Count: 5.5 10*3/uL (ref 4.0–10.5)
nRBC: 0 % (ref 0.0–0.2)

## 2019-02-17 LAB — CMP (CANCER CENTER ONLY)
ALT: 25 U/L (ref 0–44)
AST: 19 U/L (ref 15–41)
Albumin: 4 g/dL (ref 3.5–5.0)
Alkaline Phosphatase: 81 U/L (ref 38–126)
Anion gap: 8 (ref 5–15)
BUN: 18 mg/dL (ref 6–20)
CO2: 27 mmol/L (ref 22–32)
Calcium: 9.4 mg/dL (ref 8.9–10.3)
Chloride: 102 mmol/L (ref 98–111)
Creatinine: 0.98 mg/dL (ref 0.44–1.00)
GFR, Est AFR Am: >60 mL/min (ref >60)
GFR, Estimated: >60 mL/min (ref >60)
Glucose, Bld: 88 mg/dL (ref 70–99)
Potassium: 4.4 mmol/L (ref 3.5–5.1)
Sodium: 137 mmol/L (ref 135–145)
Total Bilirubin: 0.2 mg/dL — ABNORMAL LOW (ref 0.3–1.2)
Total Protein: 7.5 g/dL (ref 6.5–8.1)

## 2019-02-17 MED ORDER — HYDROCODONE-ACETAMINOPHEN 7.5-325 MG PO TABS: 1.0000 | ORAL_TABLET | Freq: Three times a day (TID) | ORAL | 0 refills | Status: DC | PRN

## 2019-02-17 MED FILL — HYDROCODON-APAP 7.5-325: 7.5-325 | 20 days supply | Qty: 60 | Fill #0

## 2019-02-17 NOTE — Telephone Encounter (Signed)
Dr. Valeta Harms - are these results finalized yet?

## 2019-02-17 NOTE — Progress Notes (Addendum)
[place] OFFICE PROGRESS NOTE  Patient Care Team: Patient, No Pcp Per as PCP - General (General Practice) [name], MD as Medical Oncologist (Oncology) Cordelia Poche, RN as Oncology Nurse Navigator  HEME/ONC OVERVIEW: 1. Limited stage small cell lung cancer, Stage IIIA ([id number]) -01/2019:   Large RUL mass w/ thoracic adenopathy and a second mass/consolidation in the RUL periphery on CTA chest  FDG-avid RUL mass with suspected mediastinal invasion, R paratracheal adenopathy; indeterminate consolidation in R apex (post-obstructive pneumonitis vs. satellite lesion in the same lobe)  MRI brain negative for malignancy -02/2019: bronchoscopy w/ biopsy of the RUL mass and 7R LN, path showed small cell lung cancer   TREATMENT REGIMEN:  Concurrent chemoradiation with cisplatin/etoposide, plan for 4 cycles; date TBD   ASSESSMENT & PLAN:   Suspected lung cancer, likely Stage IIIA ([id number]) -I independently reviewed the radiologic images of recent PET and MRI brain, and agree with findings documented -In summary, PET showed an FDG avid RUL mass with suspected mediastinal invasion, right paratracheal adenopathy, and an indeterminant consolidation in the right apex (post-obstructive pneumonitis vs satellite lesion').  MRI brain was negative for metastatic disease. -She underwent bronchoscopy with biopsy of the RUL mass and 7R LN, results pending -I reviewed imaging results in detail with patient -I discussed the case with pathology, who indicated that the preliminary report was suggestive of NSCLC, but some stains are pending to rule out neuroendocrine carcinoma/small cell carcinoma  -Port scheduled on 02/18/2019  -Patient initially met with rad onc at [facility] but changed care to [facility] due to the commute; CT simulation there tentatively scheduled on 02/19/2019 -We reviewed the NCCN guidelines in detail.  For limited-stage small cell lung cancer, the  standard-of-care approach is concurrent chemoradiation.  -We discussed the role of chemotherapy. The intent is of curative intent. -We discussed some of the risks, benefits, side-effects of cisplatin and etoposide. Treatment is intravenous, Days 1-3, every 21 days x 4 cycles, concurrent with radiation -Some of the short term side-effects included, though not limited to, including weight loss, life threatening infections, risk of allergic reactions, need for transfusions of blood products, nausea, vomiting, change in bowel habits, loss of hair, admission to hospital for various reasons, and risks of death.  -Long term side-effects are also discussed including risks of infertility, permanent damage to nerve function, hearing loss, chronic fatigue, kidney damage with possibility needing hemodialysis, and rare secondary malignancy including bone marrow disorders. -The patient is aware that the response rates discussed earlier is not guaranteed. Patient education material was dispensed. -We will reach out to [facility] radiation oncology to coordinate the timing of treatment, likely to start the week of 03/02/2019  Right-sided chest pain -Likely due to pleural invasion by one of the RUL lung masses -Currently on MS-Contin '15mg'$  BID, with IR morphine '15mg'$  q6hrs PRN for breakthrough pain -Due to sedation with IR morphine, I have changed it to Norco 7.5/325 q8hrs PRN   History of tobacco abuse  -Quit in 2020 -I counseled the patient on the importance of abstinence from all tobacco products   No orders of the defined types were placed in this encounter.  All questions were answered. The patient knows to call the clinic with any problems, questions or concerns. No barriers to learning was detected.  A total of more than 40 minutes were spent face-to-face with the patient during this encounter and over half of that time was spent on counseling and coordination of care as  outlined above.   Return  tentatively on 03/03/2019 for labs, port flush, clinic appt and Cycle 1 of weekly chemotherapy.   [name], MD 02/18/2019 12:39 PM  CHIEF COMPLAINT: "I am doing okay"  INTERVAL HISTORY: Ms. Robotham returns clinic for follow-up of right lung mass, suspicious for lung cancer.  Patient reports that she still has some shortness of breath, usually with exertion after walking approximately 50 yards, as well as intermittent coughing.  She has occasional sputum production (yellow-green), but denies any hemoptysis, fever, or chest pain.  She takes MS-Contin twice a day, as well as IR morphine 1-2 times a day with reasonable pain control, but she feels that the morphine is making her really drowsy, and she spent most of the day sleeping.  She denies any other complaint today.  REVIEW OF SYSTEMS:   Constitutional: ( - ) fevers, ( - )  chills , ( - ) night sweats Eyes: ( - ) blurriness of vision, ( - ) double vision, ( - ) watery eyes Ears, nose, mouth, throat, and face: ( - ) mucositis, ( - ) sore throat Respiratory: ( + ) cough, ( + ) dyspnea, ( - ) wheezes Cardiovascular: ( - ) palpitation, ( - ) chest discomfort, ( - ) lower extremity swelling Gastrointestinal:  ( - ) nausea, ( - ) heartburn, ( - ) change in bowel habits Skin: ( - ) abnormal skin rashes Lymphatics: ( - ) new lymphadenopathy, ( - ) easy bruising Neurological: ( - ) numbness, ( - ) tingling, ( - ) new weaknesses Behavioral/Psych: ( - ) mood change, ( - ) new changes  All other systems were reviewed with the patient and are negative.  SUMMARY OF ONCOLOGIC HISTORY: Oncology History  Small cell lung cancer ([place])  01/28/2019 Initial Diagnosis   Lung cancer ([place])   01/28/2019 Imaging   CTA chest: IMPRESSION: 1. No demonstrable pulmonary embolus. No thoracic aortic aneurysm or dissection.   2. Mass arising in the right upper lobe extending into the right hilum measuring 3.1 x 2.5 cm. Questionable second mass in the right upper  lobe more peripherally and anteriorly measuring 2.4 x 1.7 cm versus consolidation in this area. There is adenopathy in the right hilum and right paratracheal regions. Neoplastic involvement is felt to be present. It may be prudent to correlate with nuclear medicine PET study to further characterize.   3. Underlying emphysematous change with areas of fibrosis throughout the periphery of each lung. Fibrosis is somewhat evenly distributed between upper and lower lobe regions. There is atelectatic change in the lung bases, more on the right than on the left.   Emphysema (ICD10-J43.9).   02/06/2019 Imaging   PET:   IMPRESSION: Right upper lobe perihilar lung mass is intensely hypermetabolic compatible with primary bronchogenic carcinoma. Suspect mediastinal invasion with hypermetabolic right paratracheal adenopathy. Assuming a non-small cell neoplasm this is favored to represent a T4N1M0 lesion or stage IIIa disease.   Indeterminate nodular area of subpleural consolidation in with thin the lateral right apex has an SUV max of 4.96. This may represent an area of postobstructive pneumonitis versus satellite lesion in same lobe.   Aortic Atherosclerosis (ICD10-I70.0) and Emphysema (ICD10-J43.9).   02/07/2019 Imaging   MRI brain: IMPRESSION: 1. Normal MRI appearance the brain. No evidence for metastatic disease to the brain. 2. Left mastoid effusion without obstructing nasopharyngeal lesion.   02/12/2019 Pathology Results   Specimen Submitted:  A. LUNG, RIGHT UPPER LOBE, LAVAGE:    FINAL  MICROSCOPIC DIAGNOSIS:  - Atypical cells present  - See comment   SPECIMEN ADEQUACY:  Satisfactory for evaluation   DIAGNOSTIC COMMENTS:  Mixed acute and chronic inflammation is present.  There are rare atypical cells present on the heme slides only.  These  are not seen on the cytology smears.    02/12/2019 Pathology Results   FINAL MICROSCOPIC DIAGNOSIS:   A.   MASS, RIGHT HILAR, FINE  NEEDLE ASPIRATION:  -  Malignant cells consistent with small cell carcinoma  -  See comment   B. LYMPH NODE, 7 NODE, FINE NEEDLE ASPIRATION:  -  No malignant cells identified   COMMENT:   A.  Immunohistochemistry performed on the cell block shows the  neoplastic cells are positive for TTF-1, CD56, and synaptophysin but  negative for chromogranin, cytokeratin 5 6 and p40.  Overall, the  combined morphology and immunophenotype are consistent with small cell  carcinoma.    02/18/2019 Cancer Staging   Staging form: Lung, AJCC 8th Edition - Clinical: Stage IIIA (cT4, cN1, cM0) - Signed by [name], MD on 02/18/2019     I have reviewed the past medical history, past surgical history, social history and family history with the patient and they are unchanged from previous note.  ALLERGIES:  has No Known Allergies.  MEDICATIONS:  Current Outpatient Medications  Medication Sig Dispense Refill  . albuterol (PROVENTIL HFA;VENTOLIN HFA) 108 (90 Base) MCG/ACT inhaler Inhale 1-2 puffs into the lungs every 4 (four) hours as needed for wheezing or shortness of breath.     Marland Kitchen aspirin EC 325 MG tablet Take by mouth.    . Calcium Carb-Cholecalciferol (CALCIUM-VITAMIN D) 500-200 MG-UNIT tablet Take 1 tablet by mouth 2 (two) times daily.     Marland Kitchen ibuprofen (ADVIL) 800 MG tablet Take 1 tablet (800 mg total) by mouth 3 (three) times daily. (Patient taking differently: Take 800 mg by mouth every 8 (eight) hours as needed (pain). ) 21 tablet 0  . morphine (MS CONTIN) 15 MG 12 hr tablet Take 1 tablet (15 mg total) by mouth every 12 (twelve) hours. 60 tablet 0  . HYDROcodone-acetaminophen (NORCO) 7.5-325 MG tablet Take 1 tablet by mouth every 8 (eight) hours as needed for moderate pain. 60 tablet 0   No current facility-administered medications for this visit.    Facility-Administered Medications Ordered in Other Visits  Medication Dose Route Frequency Provider Last Rate Last Dose  . 0.9 %  sodium chloride  infusion   Intravenous Continuous Allred, Darrell K, PA-C 50 mL/hr at 02/18/19 1226    . ceFAZolin (ANCEF) IVPB 2g/100 mL premix  2 g Intravenous to XRAY Allred, Darrell K, PA-C        PHYSICAL EXAMINATION: ECOG PERFORMANCE STATUS: 1 - Symptomatic but completely ambulatory  Today's Vitals   02/17/19 1502  BP: (!) 150/92  Pulse: 90  Resp: 18  Temp: 97.7 F (36.5 C)  TempSrc: Temporal  SpO2: 100%  Weight: 143 lb 6.4 oz (65 kg)  Height: 5' (1.524 m)   Body mass index is 28.01 kg/m.  Filed Weights   02/17/19 1502  Weight: 143 lb 6.4 oz (65 kg)    GENERAL: alert, no distress and comfortable SKIN: skin color, texture, turgor are normal, no rashes or significant lesions EYES: conjunctiva are pink and non-injected, sclera clear OROPHARYNX: no exudate, no erythema; lips, buccal mucosa, and tongue normal  NECK: supple, non-tender LUNGS: clear to auscultation with normal breathing effort HEART: regular rate & rhythm and no murmurs and  no lower extremity edema ABDOMEN: soft, non-tender, non-distended, normal bowel sounds Musculoskeletal: no cyanosis of digits and no clubbing  PSYCH: alert & oriented x 3, fluent speech  LABORATORY DATA:  I have reviewed the data as listed    Component Value Date/Time   NA 137 02/17/2019 1446   K 4.4 02/17/2019 1446   CL 102 02/17/2019 1446   CO2 27 02/17/2019 1446   GLUCOSE 88 02/17/2019 1446   BUN 18 02/17/2019 1446   CREATININE 0.98 02/17/2019 1446   CALCIUM 9.4 02/17/2019 1446   PROT 7.5 02/17/2019 1446   ALBUMIN 4.0 02/17/2019 1446   AST 19 02/17/2019 1446   ALT 25 02/17/2019 1446   ALKPHOS 81 02/17/2019 1446   BILITOT 0.2 (L) 02/17/2019 1446   GFRNONAA >60 02/17/2019 1446   GFRAA >60 02/17/2019 1446    No results found for: SPEP, UPEP  Lab Results  Component Value Date   WBC 6.1 02/18/2019   NEUTROABS 4.2 02/18/2019   HGB 14.4 02/18/2019   HCT 42.5 02/18/2019   MCV 87.3 02/18/2019   PLT 265 02/18/2019      Chemistry       Component Value Date/Time   NA 137 02/17/2019 1446   K 4.4 02/17/2019 1446   CL 102 02/17/2019 1446   CO2 27 02/17/2019 1446   BUN 18 02/17/2019 1446   CREATININE 0.98 02/17/2019 1446      Component Value Date/Time   CALCIUM 9.4 02/17/2019 1446   ALKPHOS 81 02/17/2019 1446   AST 19 02/17/2019 1446   ALT 25 02/17/2019 1446   BILITOT 0.2 (L) 02/17/2019 1446       RADIOGRAPHIC STUDIES: I have personally reviewed the radiological images as listed below and agreed with the findings in the report. Dg Chest 2 View  Result Date: 01/27/2019 CLINICAL DATA:  Chest pain. EXAM: CHEST - 2 VIEW COMPARISON:  05/21/2018 and 04/29/2018 FINDINGS: Heart size is normal. There is fullness in the region of the azygos vein which could represent adenopathy. There appears to be abnormal density in that area particularly on the lateral view. There is diffuse accentuation of the interstitial markings to a greater degree than on the prior exams with slight atelectasis at the lung bases. No effusions or consolidative infiltrates. No acute bone abnormality. IMPRESSION: 1. Fullness in the region of the azygos vein. CT scan of the chest with contrast recommended for further evaluation. 2. Prominent interstitial markings with slight atelectasis at the lung bases. Some of the interstitial accentuation is chronic. Electronically Signed   By: Lorriane Shire M.D.   On: 01/27/2019 20:57   Ct Angio Chest Pe W And/or Wo Contrast  Result Date: 01/27/2019 CLINICAL DATA:  Chest pain EXAM: CT ANGIOGRAPHY CHEST WITH CONTRAST TECHNIQUE: Multidetector CT imaging of the chest was performed using the standard protocol during bolus administration of intravenous contrast. Multiplanar CT image reconstructions and MIPs were obtained to evaluate the vascular anatomy. CONTRAST:  119m OMNIPAQUE IOHEXOL 350 MG/ML SOLN COMPARISON:  Chest radiograph January 27, 2019 FINDINGS: Cardiovascular: There is no demonstrable pulmonary embolus. There is  no appreciable thoracic aortic aneurysm or dissection. The visualized great vessels appear normal. There is no pericardial effusion or pericardial thickening. Mediastinum/Nodes: Visualized thyroid appears normal. There is a mass arising in the right upper lobe extending into the superior hilar region measuring 3.1 x 2.5 cm. There is a right pretracheal lymph node measuring 2.0 x 1.8 cm. There is subcarinal adenopathy with the largest individual lymph node in the  subcarinal region measuring 1.5 x 1.4 cm. There is a lymph node in the mid right hilar region measuring 1.8 x 1.4 cm. There are scattered smaller lymph nodes elsewhere in the mediastinum. There is a fairly small hiatal hernia. Lungs/Pleura: There is underlying emphysematous change with areas of fibrosis throughout the periphery of each lung. There is a mass arising in the right upper lobe extending into the right hilar region measuring 3.1 x 2.5 cm as noted above. There is an area of either mass or consolidation more peripherally in the right upper lobe anteriorly near the apex measuring 2.4 x 1.7 cm. There is atelectatic change in each lower lobe, more notable on the right than on the left. No pleural effusions are evident. Upper Abdomen: Visualized upper abdominal structures appear unremarkable. Musculoskeletal: There are no blastic or lytic bone lesions. No chest wall lesions are evident. Review of the MIP images confirms the above findings. IMPRESSION: 1. No demonstrable pulmonary embolus. No thoracic aortic aneurysm or dissection. 2. Mass arising in the right upper lobe extending into the right hilum measuring 3.1 x 2.5 cm. Questionable second mass in the right upper lobe more peripherally and anteriorly measuring 2.4 x 1.7 cm versus consolidation in this area. There is adenopathy in the right hilum and right paratracheal regions. Neoplastic involvement is felt to be present. It may be prudent to correlate with nuclear medicine PET study to further  characterize. 3. Underlying emphysematous change with areas of fibrosis throughout the periphery of each lung. Fibrosis is somewhat evenly distributed between upper and lower lobe regions. There is atelectatic change in the lung bases, more on the right than on the left. Emphysema (ICD10-J43.9). Electronically Signed   By: Lowella Grip III M.D.   On: 01/27/2019 21:55   Mr Samantha Cos QA Contrast  Result Date: 02/07/2019 CLINICAL DATA:  Lung cancer. Staging exam. No neurologic symptoms. EXAM: MRI HEAD WITHOUT AND WITH CONTRAST TECHNIQUE: Multiplanar, multiecho pulse sequences of the brain and surrounding structures were obtained without and with intravenous contrast. CONTRAST:  42m MAGNEVIST GADOPENTETATE DIMEGLUMINE 469.01 MG/ML IV SOLN COMPARISON:  None. FINDINGS: Brain: No acute infarct, hemorrhage, or mass lesion is present. No significant white matter lesions are present. The ventricles are of normal size. No significant extraaxial fluid collection is present. The internal auditory canals are within normal limits. The brainstem and cerebellum are within normal limits. Postcontrast images demonstrate no pathologic enhancement. Vascular: Flow is present in the major intracranial arteries. Skull and upper cervical spine: The craniocervical junction is normal. Upper cervical spine is within normal limits. Marrow signal is unremarkable. Sinuses/Orbits: A left mastoid effusion is present. There is no obstructing nasopharyngeal lesion. The paranasal sinuses and mastoid air cells are otherwise clear. The globes and orbits are within normal limits. IMPRESSION: 1. Normal MRI appearance the brain. No evidence for metastatic disease to the brain. 2. Left mastoid effusion without obstructing nasopharyngeal lesion. Electronically Signed   By: CSan MorelleM.D.   On: 02/07/2019 20:59   Nm Pet Image Initial (pi) Skull Base To Thigh  Result Date: 02/06/2019 CLINICAL DATA:  Initial treatment strategy for lung  mass. EXAM: NUCLEAR MEDICINE PET SKULL BASE TO THIGH TECHNIQUE: 7.4 mCi F-18 FDG was injected intravenously. Full-ring PET imaging was performed from the skull base to thigh after the radiotracer. CT data was obtained and used for attenuation correction and anatomic localization. Fasting blood glucose: 89 mg/dl COMPARISON:  CT chest 11/27/2018 FINDINGS: Mediastinal blood pool activity: SUV max 2.16 Liver activity: SUV  max NA NECK: No hypermetabolic lymph nodes in the neck. Incidental CT findings: none CHEST: Right upper lobe perihilar lung mass is identified measuring 3.6 cm, image 60/4. SUV max is equal to 10.3. Tumor extends along the right mainstem bronchus into the right side of mediastinum. Within the lateral right apex there is a subpleural area of increased nodularity measuring approximately 2.8 x 1.5 cm within SUV max of 4.96. Hypermetabolic right paratracheal lymph node measures 1.7 cm and has an SUV max of 10.3. No hypermetabolic supraclavicular lymph nodes. No hypermetabolic left mediastinal or hilar lymph nodes. Incidental CT findings: Emphysema. Cardiac enlargement. Aortic atherosclerosis. ABDOMEN/PELVIS: No abnormal FDG uptake within the liver, pancreas, spleen, or adrenal glands. No hypermetabolic abdominopelvic lymph nodes. Incidental CT findings: IVC filter. Aortic atherosclerosis. Previous cholecystectomy SKELETON: No focal hypermetabolic activity to suggest skeletal metastasis. Incidental CT findings: Chronic posttraumatic deformity involves the left hip. IMPRESSION: Right upper lobe perihilar lung mass is intensely hypermetabolic compatible with primary bronchogenic carcinoma. Suspect mediastinal invasion with hypermetabolic right paratracheal adenopathy. Assuming a non-small cell neoplasm this is favored to represent a T4N1M0 lesion or stage IIIa disease. Indeterminate nodular area of subpleural consolidation in with thin the lateral right apex has an SUV max of 4.96. This may represent an area  of postobstructive pneumonitis versus satellite lesion in same lobe. Aortic Atherosclerosis (ICD10-I70.0) and Emphysema (ICD10-J43.9). Electronically Signed   By: Kerby Moors M.D.   On: 02/06/2019 15:39

## 2019-02-17 NOTE — Patient Instructions (Signed)
Carboplatin injection What is this medicine? CARBOPLATIN (KAR boe pla tin) is a chemotherapy drug. It targets fast dividing cells, like cancer cells, and causes these cells to die. This medicine is used to treat ovarian cancer and many other cancers. This medicine may be used for other purposes; ask your health care provider or pharmacist if you have questions. COMMON BRAND NAME(S): Paraplatin What should I tell my health care provider before I take this medicine? They need to know if you have any of these conditions:  blood disorders  hearing problems  kidney disease  recent or ongoing radiation therapy  an unusual or allergic reaction to carboplatin, cisplatin, other chemotherapy, other medicines, foods, dyes, or preservatives  pregnant or trying to get pregnant  breast-feeding How should I use this medicine? This drug is usually given as an infusion into a vein. It is administered in a hospital or clinic by a specially trained health care professional. Talk to your pediatrician regarding the use of this medicine in children. Special care may be needed. Overdosage: If you think you have taken too much of this medicine contact a poison control center or emergency room at once. NOTE: This medicine is only for you. Do not share this medicine with others. What if I miss a dose? It is important not to miss a dose. Call your doctor or health care professional if you are unable to keep an appointment. What may interact with this medicine?  medicines for seizures  medicines to increase blood counts like filgrastim, pegfilgrastim, sargramostim  some antibiotics like amikacin, gentamicin, neomycin, streptomycin, tobramycin  vaccines Talk to your doctor or health care professional before taking any of these medicines:  acetaminophen  aspirin  ibuprofen  ketoprofen  naproxen This list may not describe all possible interactions. Give your health care provider a list of all the  medicines, herbs, non-prescription drugs, or dietary supplements you use. Also tell them if you smoke, drink alcohol, or use illegal drugs. Some items may interact with your medicine. What should I watch for while using this medicine? Your condition will be monitored carefully while you are receiving this medicine. You will need important blood work done while you are taking this medicine. This drug may make you feel generally unwell. This is not uncommon, as chemotherapy can affect healthy cells as well as cancer cells. Report any side effects. Continue your course of treatment even though you feel ill unless your doctor tells you to stop. In some cases, you may be given additional medicines to help with side effects. Follow all directions for their use. Call your doctor or health care professional for advice if you get a fever, chills or sore throat, or other symptoms of a cold or flu. Do not treat yourself. This drug decreases your body's ability to fight infections. Try to avoid being around people who are sick. This medicine may increase your risk to bruise or bleed. Call your doctor or health care professional if you notice any unusual bleeding. Be careful brushing and flossing your teeth or using a toothpick because you may get an infection or bleed more easily. If you have any dental work done, tell your dentist you are receiving this medicine. Avoid taking products that contain aspirin, acetaminophen, ibuprofen, naproxen, or ketoprofen unless instructed by your doctor. These medicines may hide a fever. Do not become pregnant while taking this medicine. Women should inform their doctor if they wish to become pregnant or think they might be pregnant. There is a  potential for serious side effects to an unborn child. Talk to your health care professional or pharmacist for more information. Do not breast-feed an infant while taking this medicine. What side effects may I notice from receiving this  medicine? Side effects that you should report to your doctor or health care professional as soon as possible:  allergic reactions like skin rash, itching or hives, swelling of the face, lips, or tongue  signs of infection - fever or chills, cough, sore throat, pain or difficulty passing urine  signs of decreased platelets or bleeding - bruising, pinpoint red spots on the skin, black, tarry stools, nosebleeds  signs of decreased red blood cells - unusually weak or tired, fainting spells, lightheadedness  breathing problems  changes in hearing  changes in vision  chest pain  high blood pressure  low blood counts - This drug may decrease the number of white blood cells, red blood cells and platelets. You may be at increased risk for infections and bleeding.  nausea and vomiting  pain, swelling, redness or irritation at the injection site  pain, tingling, numbness in the hands or feet  problems with balance, talking, walking  trouble passing urine or change in the amount of urine Side effects that usually do not require medical attention (report to your doctor or health care professional if they continue or are bothersome):  hair loss  loss of appetite  metallic taste in the mouth or changes in taste This list may not describe all possible side effects. Call your doctor for medical advice about side effects. You may report side effects to FDA at 1-800-FDA-1088. Where should I keep my medicine? This drug is given in a hospital or clinic and will not be stored at home. NOTE: This sheet is a summary. It may not cover all possible information. If you have questions about this medicine, talk to your doctor, pharmacist, or health care provider.  2020 [employer] Standard (2007-07-01 14:38:05)  Paclitaxel injection What is this medicine? PACLITAXEL (PAK li TAX el) is a chemotherapy drug. It targets fast dividing cells, like cancer cells, and causes these cells to die. This  medicine is used to treat ovarian cancer, breast cancer, lung cancer, Kaposi's sarcoma, and other cancers. This medicine may be used for other purposes; ask your health care provider or pharmacist if you have questions. COMMON BRAND NAME(S): Onxol, Taxol What should I tell my health care provider before I take this medicine? They need to know if you have any of these conditions:  history of irregular heartbeat  liver disease  low blood counts, like low white cell, platelet, or red cell counts  lung or breathing disease, like asthma  tingling of the fingers or toes, or other nerve disorder  an unusual or allergic reaction to paclitaxel, alcohol, polyoxyethylated castor oil, other chemotherapy, other medicines, foods, dyes, or preservatives  pregnant or trying to get pregnant  breast-feeding How should I use this medicine? This drug is given as an infusion into a vein. It is administered in a hospital or clinic by a specially trained health care professional. Talk to your pediatrician regarding the use of this medicine in children. Special care may be needed. Overdosage: If you think you have taken too much of this medicine contact a poison control center or emergency room at once. NOTE: This medicine is only for you. Do not share this medicine with others. What if I miss a dose? It is important not to miss your dose. Call your doctor  or health care professional if you are unable to keep an appointment. What may interact with this medicine? Do not take this medicine with any of the following medications:  disulfiram  metronidazole This medicine may also interact with the following medications:  antiviral medicines for hepatitis, HIV or AIDS  certain antibiotics like erythromycin and clarithromycin  certain medicines for fungal infections like ketoconazole and itraconazole  certain medicines for seizures like carbamazepine, phenobarbital,  phenytoin  gemfibrozil  nefazodone  rifampin  St. John's wort This list may not describe all possible interactions. Give your health care provider a list of all the medicines, herbs, non-prescription drugs, or dietary supplements you use. Also tell them if you smoke, drink alcohol, or use illegal drugs. Some items may interact with your medicine. What should I watch for while using this medicine? Your condition will be monitored carefully while you are receiving this medicine. You will need important blood work done while you are taking this medicine. This medicine can cause serious allergic reactions. To reduce your risk you will need to take other medicine(s) before treatment with this medicine. If you experience allergic reactions like skin rash, itching or hives, swelling of the face, lips, or tongue, tell your doctor or health care professional right away. In some cases, you may be given additional medicines to help with side effects. Follow all directions for their use. This drug may make you feel generally unwell. This is not uncommon, as chemotherapy can affect healthy cells as well as cancer cells. Report any side effects. Continue your course of treatment even though you feel ill unless your doctor tells you to stop. Call your doctor or health care professional for advice if you get a fever, chills or sore throat, or other symptoms of a cold or flu. Do not treat yourself. This drug decreases your body's ability to fight infections. Try to avoid being around people who are sick. This medicine may increase your risk to bruise or bleed. Call your doctor or health care professional if you notice any unusual bleeding. Be careful brushing and flossing your teeth or using a toothpick because you may get an infection or bleed more easily. If you have any dental work done, tell your dentist you are receiving this medicine. Avoid taking products that contain aspirin, acetaminophen, ibuprofen,  naproxen, or ketoprofen unless instructed by your doctor. These medicines may hide a fever. Do not become pregnant while taking this medicine. Women should inform their doctor if they wish to become pregnant or think they might be pregnant. There is a potential for serious side effects to an unborn child. Talk to your health care professional or pharmacist for more information. Do not breast-feed an infant while taking this medicine. Men are advised not to father a child while receiving this medicine. This product may contain alcohol. Ask your pharmacist or healthcare provider if this medicine contains alcohol. Be sure to tell all healthcare providers you are taking this medicine. Certain medicines, like metronidazole and disulfiram, can cause an unpleasant reaction when taken with alcohol. The reaction includes flushing, headache, nausea, vomiting, sweating, and increased thirst. The reaction can last from 30 minutes to several hours. What side effects may I notice from receiving this medicine? Side effects that you should report to your doctor or health care professional as soon as possible:  allergic reactions like skin rash, itching or hives, swelling of the face, lips, or tongue  breathing problems  changes in vision  fast, irregular heartbeat  high  or low blood pressure  mouth sores  pain, tingling, numbness in the hands or feet  signs of decreased platelets or bleeding - bruising, pinpoint red spots on the skin, black, tarry stools, blood in the urine  signs of decreased red blood cells - unusually weak or tired, feeling faint or lightheaded, falls  signs of infection - fever or chills, cough, sore throat, pain or difficulty passing urine  signs and symptoms of liver injury like dark yellow or brown urine; general ill feeling or flu-like symptoms; light-colored stools; loss of appetite; nausea; right upper belly pain; unusually weak or tired; yellowing of the eyes or  skin  swelling of the ankles, feet, hands  unusually slow heartbeat Side effects that usually do not require medical attention (report to your doctor or health care professional if they continue or are bothersome):  diarrhea  hair loss  loss of appetite  muscle or joint pain  nausea, vomiting  pain, redness, or irritation at site where injected  tiredness This list may not describe all possible side effects. Call your doctor for medical advice about side effects. You may report side effects to FDA at 1-800-FDA-1088. Where should I keep my medicine? This drug is given in a hospital or clinic and will not be stored at home. NOTE: This sheet is a summary. It may not cover all possible information. If you have questions about this medicine, talk to your doctor, pharmacist, or health care provider.  2020 [employer] Standard (2016-11-27 13:14:55)

## 2019-02-18 ENCOUNTER — Ambulatory Visit (HOSPITAL_COMMUNITY)
Admission: RE | Admit: 2019-02-18 | Discharge: 2019-02-18 | Disposition: A | Discharge status: Home / Self Care | Payer: Medicaid Other | Source: Ambulatory Visit | Attending: Hematology | Admitting: Hematology

## 2019-02-18 ENCOUNTER — Encounter: Payer: Self-pay | Admitting: *Deleted

## 2019-02-18 ENCOUNTER — Other Ambulatory Visit: Payer: Self-pay

## 2019-02-18 ENCOUNTER — Encounter (HOSPITAL_COMMUNITY): Payer: Self-pay | Admitting: Interventional Radiology

## 2019-02-18 DIAGNOSIS — I10 Essential (primary) hypertension: Secondary | ICD-10-CM | POA: Diagnosis not present

## 2019-02-18 DIAGNOSIS — Z79899 Other long term (current) drug therapy: Secondary | ICD-10-CM | POA: Insufficient documentation

## 2019-02-18 DIAGNOSIS — Z87891 Personal history of nicotine dependence: Secondary | ICD-10-CM | POA: Insufficient documentation

## 2019-02-18 DIAGNOSIS — C3490 Malignant neoplasm of unspecified part of unspecified bronchus or lung: Secondary | ICD-10-CM | POA: Diagnosis present

## 2019-02-18 DIAGNOSIS — F329 Major depressive disorder, single episode, unspecified: Secondary | ICD-10-CM | POA: Insufficient documentation

## 2019-02-18 DIAGNOSIS — Z7982 Long term (current) use of aspirin: Secondary | ICD-10-CM | POA: Diagnosis not present

## 2019-02-18 DIAGNOSIS — R918 Other nonspecific abnormal finding of lung field: Secondary | ICD-10-CM

## 2019-02-18 HISTORY — PX: IR IMAGING GUIDED PORT INSERTION: IMG5740

## 2019-02-18 LAB — CBC WITH DIFFERENTIAL/PLATELET
Abs Immature Granulocytes: 0.05 10*3/uL (ref 0.00–0.07)
Basophils Absolute: 0.1 10*3/uL (ref 0.0–0.1)
Basophils Relative: 1 %
Eosinophils Absolute: 0.4 10*3/uL (ref 0.0–0.5)
Eosinophils Relative: 6 %
HCT: 42.5 % (ref 36.0–46.0)
Hemoglobin: 14.4 g/dL (ref 12.0–15.0)
Immature Granulocytes: 1 %
Lymphocytes Relative: 15 %
Lymphs Abs: 0.9 10*3/uL (ref 0.7–4.0)
MCH: 29.6 pg (ref 26.0–34.0)
MCHC: 33.9 g/dL (ref 30.0–36.0)
MCV: 87.3 fL (ref 80.0–100.0)
Monocytes Absolute: 0.5 10*3/uL (ref 0.1–1.0)
Monocytes Relative: 8 %
Neutro Abs: 4.2 10*3/uL (ref 1.7–7.7)
Neutrophils Relative %: 69 %
Platelets: 265 10*3/uL (ref 150–400)
RBC: 4.87 MIL/uL (ref 3.87–5.11)
RDW: 13.1 % (ref 11.5–15.5)
WBC: 6.1 10*3/uL (ref 4.0–10.5)
nRBC: 0 % (ref 0.0–0.2)

## 2019-02-18 LAB — CYTOLOGY - NON PAP

## 2019-02-18 MED ORDER — LIDOCAINE HCL (PF) 1 % IJ SOLN
INTRAMUSCULAR | Status: AC | PRN
  Administered 2019-02-18: 5 mL
  Administered 2019-02-18: 10 mL

## 2019-02-18 MED ORDER — HEPARIN SOD (PORK) LOCK FLUSH 100 UNIT/ML IV SOLN
INTRAVENOUS | Status: AC | PRN
  Administered 2019-02-18: 500 [IU] via INTRAVENOUS

## 2019-02-18 MED ORDER — FENTANYL CITRATE (PF) 100 MCG/2ML IJ SOLN
INTRAMUSCULAR | Status: AC
  Filled 2019-02-18: qty 2

## 2019-02-18 MED ORDER — LIDOCAINE HCL 1 % IJ SOLN
INTRAMUSCULAR | Status: AC
  Filled 2019-02-18: qty 20

## 2019-02-18 MED ORDER — MIDAZOLAM HCL 2 MG/2ML IJ SOLN
INTRAMUSCULAR | Status: AC
  Filled 2019-02-18: qty 4

## 2019-02-18 MED ORDER — FENTANYL CITRATE (PF) 100 MCG/2ML IJ SOLN
INTRAMUSCULAR | Status: AC | PRN
  Administered 2019-02-18 (×2): 50 ug via INTRAVENOUS

## 2019-02-18 MED ORDER — MIDAZOLAM HCL 2 MG/2ML IJ SOLN
INTRAMUSCULAR | Status: AC | PRN
  Administered 2019-02-18 (×2): 1 mg via INTRAVENOUS

## 2019-02-18 MED ORDER — HEPARIN SOD (PORK) LOCK FLUSH 100 UNIT/ML IV SOLN
INTRAVENOUS | Status: AC
  Filled 2019-02-18: qty 5

## 2019-02-18 MED ORDER — SODIUM CHLORIDE 0.9 % IV SOLN
INTRAVENOUS | Status: DC
  Administered 2019-02-18: 12:00:00 via INTRAVENOUS

## 2019-02-18 MED ORDER — CEFAZOLIN SODIUM-DEXTROSE 2-4 GM/100ML-% IV SOLN
2.0000 g | INTRAVENOUS | Status: AC
  Administered 2019-02-18: 14:00:00 2 g via INTRAVENOUS

## 2019-02-18 MED ORDER — CEFAZOLIN SODIUM-DEXTROSE 2-4 GM/100ML-% IV SOLN
INTRAVENOUS | Status: AC
  Administered 2019-02-18: 2 g via INTRAVENOUS
  Filled 2019-02-18: qty 100

## 2019-02-18 NOTE — Consult Note (Signed)
Chief Complaint: Patient was seen in consultation today for Port-A-Cath placement  Referring Physician(s):  [place]  Supervising Physician: Aletta Edouard  Patient Status: [facility] - Out-pt  History of Present Illness: Marcia King is a 48 y.o. 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  Past Medical History:  Diagnosis Date   Cancer ([place]) 01/2019   lung ca - recent dx    Chronic pain    left hip 5 metal plates pins screws   Depression    no meds   Emphysema lung (HCC)    Hypertension    no meds currently   Lung mass    Mediastinal adenopathy    Pneumonia 2018   x 1   Smoker    20 yr hx - quit smoking in 12/2018    Past Surgical History:  Procedure Laterality Date   CHOLECYSTECTOMY     HIP CLOSED REDUCTION Left    LUNG BIOPSY Right 02/12/2019   Procedure: LUNG BIOPSY;  Surgeon: Garner Nash, DO;  Location: [facility];  Service: Thoracic;  Laterality: Right;   [place] Right 02/12/2019   Procedure: VIDEO BRONCHOSCOPY WITH ENDOBRONCHIAL ULTRASOUND;  Surgeon: Garner Nash, DO;  Location: [facility];  Service: Thoracic;  Laterality: Right;   WISDOM TOOTH EXTRACTION      Allergies: Patient has no known allergies.  Medications: Prior to Admission medications   Medication Sig Start Date End Date Taking? Authorizing Provider  albuterol (PROVENTIL HFA;VENTOLIN HFA) 108 (90 Base) MCG/ACT inhaler Inhale 1-2 puffs into the lungs every 4 (four) hours as needed for wheezing or shortness of breath.  12/27/16  Yes [provider]  aspirin EC 325 MG tablet Take by mouth. 10/24/18  Yes [provider]  Calcium Carb-Cholecalciferol (CALCIUM-VITAMIN D) 500-200 MG-UNIT tablet Take 1 tablet by mouth 2 (two) times daily.    Yes [provider]  HYDROcodone-acetaminophen (NORCO) 7.5-325 MG tablet  Take 1 tablet by mouth every 8 (eight) hours as needed for moderate pain. 02/17/19  Yes [name], MD  ibuprofen (ADVIL) 800 MG tablet Take 1 tablet (800 mg total) by mouth 3 (three) times daily. Patient taking differently: Take 800 mg by mouth every 8 (eight) hours as needed (pain).  10/22/18  Yes Tacy Learn, PA-C  morphine (MS CONTIN) 15 MG 12 hr tablet Take 1 tablet (15 mg total) by mouth every 12 (twelve) hours. 01/29/19 02/28/19 Yes [name], MD     Family History  Problem Relation Age of Onset   Lung cancer Father    Breast cancer Sister     Social History   Socioeconomic History   Marital status: Legally Separated    Spouse name: Not on file   Number of children: Not on file   Years of education: Not on file   Highest education level: Not on file  Occupational History   Not on file  Social Needs   Financial resource strain: Not on file   Food insecurity    Worry: Not on file    Inability: Not on file   Transportation needs    Medical: No    Non-medical: No  Tobacco Use   Smoking status: Former Smoker    Packs/day: 0.50    Years: 20.00    Pack years: 10.00    Types: Cigarettes    Quit date: 12/09/2018    Years since quitting: 0.1  Smokeless tobacco: Never Used  Substance and Sexual Activity   Alcohol use: Not Currently   Drug use: Not Currently   Sexual activity: Not on file  Lifestyle   Physical activity    Days per week: Not on file    Minutes per session: Not on file   Stress: Not on file  Relationships   Social connections    Talks on phone: Not on file    Gets together: Not on file    Attends religious service: Not on file    Active member of club or organization: Not on file    Attends meetings of clubs or organizations: Not on file    Relationship status: Not on file  Other Topics Concern   Not on file  Social History Narrative   Not on file      Review of Systems currently denies fever, headache, chest pain,  dyspnea, cough, abdominal/back pain, nausea, vomiting or bleeding  Vital Signs: BP (!) 152/103    Pulse (!) 111    Temp 98.6 F (37 C) (Oral)    Resp 16    LMP 01/29/2014 (LMP Unknown)    SpO2 93%   Physical Exam awake, alert.  Chest with some diminished breath sounds right upper lobe, few rhonchi.  Heart with regular rate and rhythm.  Abdomen soft, positive bowel sounds, nontender.  No lower extremity edema.  Imaging: Dg Chest 2 View  Result Date: 01/27/2019 CLINICAL DATA:  Chest pain. EXAM: CHEST - 2 VIEW COMPARISON:  05/21/2018 and 04/29/2018 FINDINGS: Heart size is normal. There is fullness in the region of the azygos vein which could represent adenopathy. There appears to be abnormal density in that area particularly on the lateral view. There is diffuse accentuation of the interstitial markings to a greater degree than on the prior exams with slight atelectasis at the lung bases. No effusions or consolidative infiltrates. No acute bone abnormality. IMPRESSION: 1. Fullness in the region of the azygos vein. CT scan of the chest with contrast recommended for further evaluation. 2. Prominent interstitial markings with slight atelectasis at the lung bases. Some of the interstitial accentuation is chronic. Electronically Signed   By: Lorriane Shire M.D.   On: 01/27/2019 20:57   Ct Angio Chest Pe W And/or Wo Contrast  Result Date: 01/27/2019 CLINICAL DATA:  Chest pain EXAM: CT ANGIOGRAPHY CHEST WITH CONTRAST TECHNIQUE: Multidetector CT imaging of the chest was performed using the standard protocol during bolus administration of intravenous contrast. Multiplanar CT image reconstructions and MIPs were obtained to evaluate the vascular anatomy. CONTRAST:  124mL OMNIPAQUE IOHEXOL 350 MG/ML SOLN COMPARISON:  Chest radiograph January 27, 2019 FINDINGS: Cardiovascular: There is no demonstrable pulmonary embolus. There is no appreciable thoracic aortic aneurysm or dissection. The visualized great vessels  appear normal. There is no pericardial effusion or pericardial thickening. Mediastinum/Nodes: Visualized thyroid appears normal. There is a mass arising in the right upper lobe extending into the superior hilar region measuring 3.1 x 2.5 cm. There is a right pretracheal lymph node measuring 2.0 x 1.8 cm. There is subcarinal adenopathy with the largest individual lymph node in the subcarinal region measuring 1.5 x 1.4 cm. There is a lymph node in the mid right hilar region measuring 1.8 x 1.4 cm. There are scattered smaller lymph nodes elsewhere in the mediastinum. There is a fairly small hiatal hernia. Lungs/Pleura: There is underlying emphysematous change with areas of fibrosis throughout the periphery of each lung. There is a mass arising in the right  upper lobe extending into the right hilar region measuring 3.1 x 2.5 cm as noted above. There is an area of either mass or consolidation more peripherally in the right upper lobe anteriorly near the apex measuring 2.4 x 1.7 cm. There is atelectatic change in each lower lobe, more notable on the right than on the left. No pleural effusions are evident. Upper Abdomen: Visualized upper abdominal structures appear unremarkable. Musculoskeletal: There are no blastic or lytic bone lesions. No chest wall lesions are evident. Review of the MIP images confirms the above findings. IMPRESSION: 1. No demonstrable pulmonary embolus. No thoracic aortic aneurysm or dissection. 2. Mass arising in the right upper lobe extending into the right hilum measuring 3.1 x 2.5 cm. Questionable second mass in the right upper lobe more peripherally and anteriorly measuring 2.4 x 1.7 cm versus consolidation in this area. There is adenopathy in the right hilum and right paratracheal regions. Neoplastic involvement is felt to be present. It may be prudent to correlate with nuclear medicine PET study to further characterize. 3. Underlying emphysematous change with areas of fibrosis throughout the  periphery of each lung. Fibrosis is somewhat evenly distributed between upper and lower lobe regions. There is atelectatic change in the lung bases, more on the right than on the left. Emphysema (ICD10-J43.9). Electronically Signed   By: Lowella Grip III M.D.   On: 01/27/2019 21:55   Mr Beryl Cos ZS Contrast  Result Date: 02/07/2019 CLINICAL DATA:  Lung cancer. Staging exam. No neurologic symptoms. EXAM: MRI HEAD WITHOUT AND WITH CONTRAST TECHNIQUE: Multiplanar, multiecho pulse sequences of the brain and surrounding structures were obtained without and with intravenous contrast. CONTRAST:  97mL MAGNEVIST GADOPENTETATE DIMEGLUMINE 469.01 MG/ML IV SOLN COMPARISON:  None. FINDINGS: Brain: No acute infarct, hemorrhage, or mass lesion is present. No significant white matter lesions are present. The ventricles are of normal size. No significant extraaxial fluid collection is present. The internal auditory canals are within normal limits. The brainstem and cerebellum are within normal limits. Postcontrast images demonstrate no pathologic enhancement. Vascular: Flow is present in the major intracranial arteries. Skull and upper cervical spine: The craniocervical junction is normal. Upper cervical spine is within normal limits. Marrow signal is unremarkable. Sinuses/Orbits: A left mastoid effusion is present. There is no obstructing nasopharyngeal lesion. The paranasal sinuses and mastoid air cells are otherwise clear. The globes and orbits are within normal limits. IMPRESSION: 1. Normal MRI appearance the brain. No evidence for metastatic disease to the brain. 2. Left mastoid effusion without obstructing nasopharyngeal lesion. Electronically Signed   By: San Morelle M.D.   On: 02/07/2019 20:59   Nm Pet Image Initial (pi) Skull Base To Thigh  Result Date: 02/06/2019 CLINICAL DATA:  Initial treatment strategy for lung mass. EXAM: NUCLEAR MEDICINE PET SKULL BASE TO THIGH TECHNIQUE: 7.4 mCi F-18 FDG was  injected intravenously. Full-ring PET imaging was performed from the skull base to thigh after the radiotracer. CT data was obtained and used for attenuation correction and anatomic localization. Fasting blood glucose: 89 mg/dl COMPARISON:  CT chest 11/27/2018 FINDINGS: Mediastinal blood pool activity: SUV max 2.16 Liver activity: SUV max NA NECK: No hypermetabolic lymph nodes in the neck. Incidental CT findings: none CHEST: Right upper lobe perihilar lung mass is identified measuring 3.6 cm, image 60/4. SUV max is equal to 10.3. Tumor extends along the right mainstem bronchus into the right side of mediastinum. Within the lateral right apex there is a subpleural area of increased nodularity measuring approximately 2.8 x  1.5 cm within SUV max of 4.96. Hypermetabolic right paratracheal lymph node measures 1.7 cm and has an SUV max of 10.3. No hypermetabolic supraclavicular lymph nodes. No hypermetabolic left mediastinal or hilar lymph nodes. Incidental CT findings: Emphysema. Cardiac enlargement. Aortic atherosclerosis. ABDOMEN/PELVIS: No abnormal FDG uptake within the liver, pancreas, spleen, or adrenal glands. No hypermetabolic abdominopelvic lymph nodes. Incidental CT findings: IVC filter. Aortic atherosclerosis. Previous cholecystectomy SKELETON: No focal hypermetabolic activity to suggest skeletal metastasis. Incidental CT findings: Chronic posttraumatic deformity involves the left hip. IMPRESSION: Right upper lobe perihilar lung mass is intensely hypermetabolic compatible with primary bronchogenic carcinoma. Suspect mediastinal invasion with hypermetabolic right paratracheal adenopathy. Assuming a non-small cell neoplasm this is favored to represent a T4N1M0 lesion or stage IIIa disease. Indeterminate nodular area of subpleural consolidation in with thin the lateral right apex has an SUV max of 4.96. This may represent an area of postobstructive pneumonitis versus satellite lesion in same lobe. Aortic  Atherosclerosis (ICD10-I70.0) and Emphysema (ICD10-J43.9). Electronically Signed   By: Kerby Moors M.D.   On: 02/06/2019 15:39    Labs:  CBC: Recent Labs    01/27/19 2021 02/12/19 1138 02/17/19 1446 02/18/19 1200  WBC 8.5 5.8 5.5 6.1  HGB 14.1 14.1 13.1 14.4  HCT 42.2 42.1 38.9 42.5  PLT 255 296 275 265    COAGS: Recent Labs    02/12/19 1138  INR 0.9  APTT 37*    BMP: Recent Labs    05/21/18 0813 01/27/19 2021 02/12/19 1138 02/17/19 1446  NA 137 135 136 137  K 3.8 3.9 4.0 4.4  CL 107 101 105 102  CO2 22 22 22 27   GLUCOSE 98 96 91 88  BUN 15 20 13 18   CALCIUM 9.1 9.6 9.5 9.4  CREATININE 0.79 0.94 0.89 0.98  GFRNONAA >60 >60 >60 >60  GFRAA >60 >60 >60 >60    LIVER FUNCTION TESTS: Recent Labs    05/21/18 0813 01/27/19 2021 02/12/19 1138 02/17/19 1446  BILITOT 0.3 0.5 0.6 0.2*  AST 57* 20 23 19   ALT 63* 21 30 25   ALKPHOS 73 90 93 81  PROT 7.9 9.4* 8.4* 7.5  ALBUMIN 3.9 4.3 3.6 4.0    TUMOR MARKERS: No results for input(s): AFPTM, CEA, CA199, CHROMGRNA in the last 8760 hours.  Assessment and Plan: 48 y.o. 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  All of the patient's questions were answered, patient is agreeable to proceed. Consent signed and in chart.     Thank you for this interesting consult.  I greatly enjoyed meeting Marcia King and look forward to participating in their care.  A copy of this report was sent to the requesting provider on this date.  Electronically Signed: D. Rowe Robert, PA-C 02/18/2019, 1:14 PM   I spent a total of 25 minutes  in face to face in clinical consultation, greater than 50%  of which was counseling/coordinating care for port a cath placement

## 2019-02-18 NOTE — Progress Notes (Signed)
Final pathology has completed and Dr Maylon Peppers is going to make changes to the treatment plan. He requests to speak to Dr Malva Limes as patient will need a completely different treatment plan.  Called and spoke to [place] at [facility] and left a message with Dr Lorette Ang cell phone number requesting a call back. Dr Malva Limes is not in the office today, she will receive the message tomorrow. Dr Maylon Peppers made aware.

## 2019-02-18 NOTE — Progress Notes (Signed)
START ON PATHWAY REGIMEN - Small Cell Lung     A cycle is every 21 days:     Etoposide      Cisplatin   **Always confirm dose/schedule in your pharmacy ordering system**  Patient Characteristics: Newly Diagnosed, Preoperative or Nonsurgical Candidate (Clinical Staging), First Line, Limited Stage, Nonsurgical Candidate Therapeutic Status: Newly Diagnosed, Preoperative or Nonsurgical Candidate (Clinical Staging) AJCC T Category: cT4 AJCC N Category: cN1 AJCC M Category: cM0 AJCC 8 Stage Grouping: IIIA Stage Classification: Limited Surgical Candidacy: Nonsurgical Candidate Intent of Therapy: Curative Intent, Discussed with Patient

## 2019-02-18 NOTE — Telephone Encounter (Signed)
PCCM:  No the final results are not back. I spoke with Dr. Melina Copa from pathology yesterday. Her and another colleague hope to have a final report today.   Garner Nash, DO [facility] 02/18/2019 7:54 AM

## 2019-02-18 NOTE — Discharge Instructions (Signed)
Please call radiology clinic [phone] or after hours and ask for Interventional Radiologist on call at [phone].  You may remove your dressing and shower tomorrow.  DO NOT use EMLA cream for 2 weeks after port placement as this cream will remove surgical glue on your incision.  Moderate Conscious Sedation, Adult, Care After These instructions provide you with information about caring for yourself after your procedure. Your health care provider may also give you more specific instructions. Your treatment has been planned according to current medical practices, but problems sometimes occur. Call your health care provider if you have any problems or questions after your procedure. What can I expect after the procedure? After your procedure, it is common:  To feel sleepy for several hours.  To feel clumsy and have poor balance for several hours.  To have poor judgment for several hours.  To vomit if you eat too soon. Follow these instructions at home: For at least 24 hours after the procedure:   Do not: ? Participate in activities where you could fall or become injured. ? Drive. ? Use heavy machinery. ? Drink alcohol. ? Take sleeping pills or medicines that cause drowsiness. ? Make important decisions or sign legal documents. ? Take care of children on your own.  Rest. Eating and drinking  Follow the diet recommended by your health care provider.  If you vomit: ? Drink water, juice, or soup when you can drink without vomiting. ? Make sure you have little or no nausea before eating solid foods. General instructions  Have a responsible adult stay with you until you are awake and alert.  Take over-the-counter and prescription medicines only as told by your health care provider.  If you smoke, do not smoke without supervision.  Keep all follow-up visits as told by your health care provider. This is important. Contact a health care provider if:  You keep feeling  nauseous or you keep vomiting.  You feel light-headed.  You develop a rash.  You have a fever. Get help right away if:  You have trouble breathing. This information is not intended to replace advice given to you by your health care provider. Make sure you discuss any questions you have with your health care provider. Document Released: 01/14/2013 Document Revised: 03/08/2017 Document Reviewed: 07/16/2015 Elsevier Patient Education  [place] Insertion, Care After This sheet gives you information about how to care for yourself after your procedure. Your health care provider may also give you more specific instructions. If you have problems or questions, contact your health care provider. What can I expect after the procedure? After the procedure, it is common to have:  Discomfort at the port insertion site.  Bruising on the skin over the port. This should improve over 3-4 days. Follow these instructions at home: [facility] care  After your port is placed, you will get a manufacturer's information card. The card has information about your port. Keep this card with you at all times.  Take care of the port as told by your health care provider. Ask your health care provider if you or a family member can get training for taking care of the port at home. A home health care nurse may also take care of the port.  Make sure to remember what type of port you have. Incision care      Follow instructions from your health care provider about how to take care of your port insertion site. Make sure you: ?  Wash your hands with soap and water before and after you change your bandage (dressing). If soap and water are not available, use hand sanitizer. ? Change your dressing as told by your health care provider.  You may remove your dressing tomorrow. ? Leave stitches (sutures), skin glue, or adhesive strips in place. These skin closures may need to stay in place for 2 weeks  or longer. If adhesive strip edges start to loosen and curl up, you may trim the loose edges. Do not remove adhesive strips completely unless your health care provider tells you to do that.  Check your port insertion site every day for signs of infection. Check for: ? Redness, swelling, or pain. ? Fluid or blood. ? Warmth. ? Pus or a bad smell. Activity  Return to your normal activities as told by your health care provider. Ask your health care provider what activities are safe for you.  Do not lift anything that is heavier than 10 lb (4.5 kg), or the limit that you are told, until your health care provider says that it is safe. General instructions  Take over-the-counter and prescription medicines only as told by your health care provider.  Do not take baths, swim, or use a hot tub until your health care provider approves. Ask your health care provider if you may take showers. You may only be allowed to take sponge baths.  You may shower tomorrow.  Do not drive for 24 hours if you were given a sedative during your procedure.  Wear a medical alert bracelet in case of an emergency. This will tell any health care providers that you have a port.  Keep all follow-up visits as told by your health care provider. This is important. Contact a health care provider if:  You cannot flush your port with saline as directed, or you cannot draw blood from the port.  You have a fever or chills.  You have redness, swelling, or pain around your port insertion site.  You have fluid or blood coming from your port insertion site.  Your port insertion site feels warm to the touch.  You have pus or a bad smell coming from the port insertion site. Get help right away if:  You have chest pain or shortness of breath.  You have bleeding from your port that you cannot control. Summary  Take care of the port as told by your health care provider. Keep the manufacturer's information card with you at all  times.  Change your dressing as told by your health care provider.  Contact a health care provider if you have a fever or chills or if you have redness, swelling, or pain around your port insertion site.  Keep all follow-up visits as told by your health care provider. This information is not intended to replace advice given to you by your health care provider. Make sure you discuss any questions you have with your health care provider. Document Released: 01/14/2013 Document Revised: 10/22/2017 Document Reviewed: 10/22/2017 Elsevier Patient Education  [place].

## 2019-02-18 NOTE — Addendum Note (Signed)
Addended by: [name] on: 02/18/2019 12:49 PM   Modules accepted: Orders

## 2019-02-18 NOTE — Procedures (Signed)
Interventional Radiology Procedure Note  Procedure: Single Lumen Power Port Placement    Access:  Right IJ vein.  Findings: Catheter tip positioned at SVC/RA junction. Port is ready for immediate use.   Complications: None  EBL: < 10 mL  Recommendations:  - Ok to shower in 24 hours - Do not submerge for 7 days - Routine line care   [name] T. Tria Noguera, M.D Pager:  [phone]   

## 2019-02-18 NOTE — Progress Notes (Signed)
Patient was seen in the office yesterday for treatment planning. Dr Maylon Peppers plans for 7 doses of weekly Carbo/Taxol concurrent with her radiation. Plan to start chemotherapy on 03/03/19.  Patient is scheduled for chemo education on 02/24/19. She has been scheduled for weekly Carbo/Taxol starting 11/24 for 7 treatments. She will have her port placed today. She has an appointment with Rad Onc tomorrow for simulation. I have also scheduled her for a dietary consult during her first treatment.  [facility] [phone]) and spoke to [facility], with Dr Malva Limes. Shared our treatment plan with her. Also explained that due to patient's weekly Carbo/Taxol the patient would need end of day appointments on Tuesdays to allow for treatment and for patient to then travel to their location. She took down the information, including my name and call back number.

## 2019-02-23 ENCOUNTER — Encounter: Payer: Self-pay | Admitting: Hematology

## 2019-02-23 ENCOUNTER — Inpatient Hospital Stay (HOSPITAL_BASED_OUTPATIENT_CLINIC_OR_DEPARTMENT_OTHER): Payer: Medicaid Other | Admitting: Hematology

## 2019-02-23 ENCOUNTER — Other Ambulatory Visit: Payer: Self-pay

## 2019-02-23 ENCOUNTER — Inpatient Hospital Stay: Payer: Medicaid Other

## 2019-02-23 ENCOUNTER — Telehealth: Payer: Self-pay | Admitting: Hematology

## 2019-02-23 ENCOUNTER — Encounter: Payer: Self-pay | Admitting: *Deleted

## 2019-02-23 VITALS — BP 159/100 | HR 95 | Temp 97.4°F | Resp 17 | Wt 145.0 lb

## 2019-02-23 DIAGNOSIS — R918 Other nonspecific abnormal finding of lung field: Secondary | ICD-10-CM

## 2019-02-23 DIAGNOSIS — G893 Neoplasm related pain (acute) (chronic): Secondary | ICD-10-CM | POA: Insufficient documentation

## 2019-02-23 DIAGNOSIS — C3490 Malignant neoplasm of unspecified part of unspecified bronchus or lung: Secondary | ICD-10-CM

## 2019-02-23 DIAGNOSIS — Z5111 Encounter for antineoplastic chemotherapy: Secondary | ICD-10-CM | POA: Diagnosis not present

## 2019-02-23 DIAGNOSIS — Z87891 Personal history of nicotine dependence: Secondary | ICD-10-CM

## 2019-02-23 DIAGNOSIS — C3481 Malignant neoplasm of overlapping sites of right bronchus and lung: Secondary | ICD-10-CM

## 2019-02-23 LAB — CBC WITH DIFFERENTIAL (CANCER CENTER ONLY)
Abs Immature Granulocytes: 0.02 10*3/uL (ref 0.00–0.07)
Basophils Absolute: 0.1 10*3/uL (ref 0.0–0.1)
Basophils Relative: 1 %
Eosinophils Absolute: 0.2 10*3/uL (ref 0.0–0.5)
Eosinophils Relative: 4 %
HCT: 38.6 % (ref 36.0–46.0)
Hemoglobin: 13 g/dL (ref 12.0–15.0)
Immature Granulocytes: 0 %
Lymphocytes Relative: 19 %
Lymphs Abs: 1.1 10*3/uL (ref 0.7–4.0)
MCH: 29 pg (ref 26.0–34.0)
MCHC: 33.7 g/dL (ref 30.0–36.0)
MCV: 86.2 fL (ref 80.0–100.0)
Monocytes Absolute: 0.3 10*3/uL (ref 0.1–1.0)
Monocytes Relative: 6 %
Neutro Abs: 3.8 10*3/uL (ref 1.7–7.7)
Neutrophils Relative %: 70 %
Platelet Count: 212 10*3/uL (ref 150–400)
RBC: 4.48 MIL/uL (ref 3.87–5.11)
RDW: 12.7 % (ref 11.5–15.5)
WBC Count: 5.5 10*3/uL (ref 4.0–10.5)
nRBC: 0 % (ref 0.0–0.2)

## 2019-02-23 LAB — CMP (CANCER CENTER ONLY)
ALT: 48 U/L — ABNORMAL HIGH (ref 0–44)
AST: 35 U/L (ref 15–41)
Albumin: 3.9 g/dL (ref 3.5–5.0)
Alkaline Phosphatase: 99 U/L (ref 38–126)
Anion gap: 10 (ref 5–15)
BUN: 13 mg/dL (ref 6–20)
CO2: 24 mmol/L (ref 22–32)
Calcium: 9.7 mg/dL (ref 8.9–10.3)
Chloride: 103 mmol/L (ref 98–111)
Creatinine: 0.92 mg/dL (ref 0.44–1.00)
GFR, Est AFR Am: >60 mL/min (ref >60)
GFR, Estimated: >60 mL/min (ref >60)
Glucose, Bld: 122 mg/dL — ABNORMAL HIGH (ref 70–99)
Potassium: 3.9 mmol/L (ref 3.5–5.1)
Sodium: 137 mmol/L (ref 135–145)
Total Bilirubin: 0.4 mg/dL (ref 0.3–1.2)
Total Protein: 7.4 g/dL (ref 6.5–8.1)

## 2019-02-23 MED ORDER — LORAZEPAM 0.5 MG PO TABS: 0.5000 mg | ORAL_TABLET | Freq: Four times a day (QID) | ORAL | 0 refills | Status: DC | PRN

## 2019-02-23 MED ORDER — LIDOCAINE-PRILOCAINE 2.5-2.5 % EX CREA: TOPICAL_CREAM | CUTANEOUS | 3 refills | Status: DC

## 2019-02-23 MED ORDER — SODIUM CHLORIDE 0.9% FLUSH
10.0000 mL | Freq: Once | INTRAVENOUS | Status: AC
  Administered 2019-02-23: 10 mL
  Filled 2019-02-23: qty 10

## 2019-02-23 MED ORDER — HEPARIN SOD (PORK) LOCK FLUSH 100 UNIT/ML IV SOLN
500.0000 [IU] | Freq: Once | INTRAVENOUS | Status: AC
  Administered 2019-02-23: 500 [IU] via INTRAVENOUS
  Filled 2019-02-23: qty 5

## 2019-02-23 MED ORDER — ONDANSETRON HCL 8 MG PO TABS: 8.0000 mg | ORAL_TABLET | Freq: Two times a day (BID) | ORAL | 1 refills | Status: DC | PRN

## 2019-02-23 MED ORDER — PROCHLORPERAZINE MALEATE 10 MG PO TABS: 10.0000 mg | ORAL_TABLET | Freq: Four times a day (QID) | ORAL | 1 refills | Status: DC | PRN

## 2019-02-23 MED ORDER — DEXAMETHASONE 4 MG PO TABS: ORAL_TABLET | ORAL | 1 refills | Status: AC

## 2019-02-23 MED ORDER — MORPHINE SULFATE ER 15 MG PO TBCR: 15.0000 mg | EXTENDED_RELEASE_TABLET | Freq: Two times a day (BID) | ORAL | 0 refills | Status: DC

## 2019-02-23 MED FILL — LORazepam 0.5 MG TABS: 0.5 | 7 days supply | Qty: 30 | Fill #0

## 2019-02-23 MED FILL — ONDANSETRON HCL 8 MG TABLET: 8 | 15 days supply | Qty: 30 | Fill #0

## 2019-02-23 MED FILL — LIDOCAINE-PRILOCAINE CREAM: 2.5-2.5 | 15 days supply | Qty: 30 | Fill #0

## 2019-02-23 MED FILL — DEXAMETHASONE 4 MG TABLET: 4 | 10 days supply | Qty: 30 | Fill #0

## 2019-02-23 MED FILL — PROCHLORPERAZINE 10 MG TAB: 10 | 8 days supply | Qty: 30 | Fill #0

## 2019-02-23 MED FILL — MORPHINE SULF ER 15 MG TAB: 15 | 30 days supply | Qty: 60 | Fill #0

## 2019-02-23 NOTE — Patient Instructions (Signed)

## 2019-02-23 NOTE — Progress Notes (Signed)
[facility] OFFICE PROGRESS NOTE  Patient Care Team: Patient, No Pcp Per as PCP - General (General Practice) Marcia Men, MD as Medical Oncologist (Oncology) Cordelia Poche, RN as Oncology Nurse Navigator  HEME/ONC OVERVIEW: 1. Limited stage small cell lung cancer, Stage IIIA ([id number]) -01/2019:   Large RUL mass w/ thoracic adenopathy and a second mass/consolidation in the RUL periphery on CTA chest  FDG-avid RUL mass with suspected mediastinal invasion, R paratracheal adenopathy; indeterminate consolidation in R apex (post-obstructive pneumonitis vs. satellite lesion in the same lobe)  MRI brain negative for malignancy -02/2019: bronchoscopy w/ biopsy of the RUL mass and 7R LN, path showed small cell lung cancer  -02/2019 - present: concurrent chemoRT with cisplatin/etoposide   2. Port in 02/2019   TREATMENT REGIMEN:  02/24/2019 - present: concurrent chemoradiation with cisplatin/etoposide, plan for 4 cycles; RT to start with Cycle 2 of chemotherapy    ASSESSMENT & PLAN:   Limited stage small cell lung cancer, Stage IIIA ([id number]) -I reviewed the updated pathology results from the recent lung bx -Given the small cell lung cancer, the standard-of-care regimen is concurrent chemoRT with cisplatin/etoposide -I reviewed the NCCN guideline with the patient, and reinforced some of the potential toxicities -I also discussed the case in detail with Dr. Malva Limes at [facility], and confirmed the timing of treatment -We will start Cycle 1 of chemotherapy on 02/24/2019, and add RT at the start of Cycle 2 of chemotherapy -PRN anti-emetics: Zofran, Compazine, dexamethasone and Ativan   Right-sided chest pain -Likely due to pleural invasion by one of the RUL lung masses -Currently on MS-Contin 15mg  BID and Norco 7.5/325 q8hrs PRN; excess sedation with IR morphine -Pain reasonably controlled -As small cell lung cancer tends to be chemotherapy-sensitive, she may  need less pain medication once she starts treatment -We will continue to adjust her pain regimen as needed   Elevated LFT's -ALT 48, mildly elevated -Review of LFT's showed periodic mild elevation, likely due to fatty liver disease -We will monitor it for now   History of tobacco abuse  -Quit in 2020 -I emphasized the importance of avoiding tobacco products -Patient expressed understanding and agreed with the plan   No orders of the defined types were placed in this encounter.  All questions were answered. The patient knows to call the clinic with any problems, questions or concerns. No barriers to learning was detected.  Return in 3 weeks for labs and clinic appt at the Start of Cycle 2 of chemotherapy.    Marcia Men, MD 02/23/2019 9:12 AM  CHIEF COMPLAINT: "I am doing okay"  INTERVAL HISTORY: Marcia King returns to clinic for follow-up of limited stage small cell lung cancer.  Patient reports that since switching IR morphine to Norco, she has not been as sedated as she had been.  Her pain is reasonably well controlled, but she still rates it as moderate.  She takes Norco 2-3 times a day, in addition to MS-Contin BID.  She denies any constipation.  He denies any other complaint today.  REVIEW OF SYSTEMS:   Constitutional: ( - ) fevers, ( - )  chills , ( - ) night sweats Eyes: ( - ) blurriness of vision, ( - ) double vision, ( - ) watery eyes Ears, nose, mouth, throat, and face: ( - ) mucositis, ( - ) sore throat Respiratory: ( - ) cough, ( - ) dyspnea, ( - ) wheezes Cardiovascular: ( - ) palpitation, ( - )  chest discomfort, ( - ) lower extremity swelling Gastrointestinal:  ( - ) nausea, ( - ) heartburn, ( - ) change in bowel habits Skin: ( - ) abnormal skin rashes Lymphatics: ( - ) new lymphadenopathy, ( - ) easy bruising Neurological: ( - ) numbness, ( - ) tingling, ( - ) new weaknesses Behavioral/Psych: ( - ) mood change, ( - ) new changes  All other systems were reviewed with  the patient and are negative.  SUMMARY OF ONCOLOGIC HISTORY: Oncology History  Small cell lung cancer ([place])  01/28/2019 Initial Diagnosis   Lung cancer ([place])   01/28/2019 Imaging   CTA chest: IMPRESSION: 1. No demonstrable pulmonary embolus. No thoracic aortic aneurysm or dissection.   2. Mass arising in the right upper lobe extending into the right hilum measuring 3.1 x 2.5 cm. Questionable second mass in the right upper lobe more peripherally and anteriorly measuring 2.4 x 1.7 cm versus consolidation in this area. There is adenopathy in the right hilum and right paratracheal regions. Neoplastic involvement is felt to be present. It may be prudent to correlate with nuclear medicine PET study to further characterize.   3. Underlying emphysematous change with areas of fibrosis throughout the periphery of each lung. Fibrosis is somewhat evenly distributed between upper and lower lobe regions. There is atelectatic change in the lung bases, more on the right than on the left.   Emphysema (ICD10-J43.9).   02/06/2019 Imaging   PET:   IMPRESSION: Right upper lobe perihilar lung mass is intensely hypermetabolic compatible with primary bronchogenic carcinoma. Suspect mediastinal invasion with hypermetabolic right paratracheal adenopathy. Assuming a non-small cell neoplasm this is favored to represent a T4N1M0 lesion or stage IIIa disease.   Indeterminate nodular area of subpleural consolidation in with thin the lateral right apex has an SUV max of 4.96. This may represent an area of postobstructive pneumonitis versus satellite lesion in same lobe.   Aortic Atherosclerosis (ICD10-I70.0) and Emphysema (ICD10-J43.9).   02/07/2019 Imaging   MRI brain: IMPRESSION: 1. Normal MRI appearance the brain. No evidence for metastatic disease to the brain. 2. Left mastoid effusion without obstructing nasopharyngeal lesion.   02/12/2019 Pathology Results   Specimen Submitted:  A. LUNG,  RIGHT UPPER LOBE, LAVAGE:    FINAL MICROSCOPIC DIAGNOSIS:  - Atypical cells present  - See comment   SPECIMEN ADEQUACY:  Satisfactory for evaluation   DIAGNOSTIC COMMENTS:  Mixed acute and chronic inflammation is present.  There are rare atypical cells present on the heme slides only.  These  are not seen on the cytology smears.    02/12/2019 Pathology Results   FINAL MICROSCOPIC DIAGNOSIS:   A.   MASS, RIGHT HILAR, FINE NEEDLE ASPIRATION:  -  Malignant cells consistent with small cell carcinoma  -  See comment   B. LYMPH NODE, 7 NODE, FINE NEEDLE ASPIRATION:  -  No malignant cells identified   COMMENT:   A.  Immunohistochemistry performed on the cell block shows the  neoplastic cells are positive for TTF-1, CD56, and synaptophysin but  negative for chromogranin, cytokeratin 5 6 and p40.  Overall, the  combined morphology and immunophenotype are consistent with small cell  carcinoma.    02/18/2019 Cancer Staging   Staging form: Lung, AJCC 8th Edition - Clinical: Stage IIIA (cT4, cN1, cM0) - Signed by Marcia Men, MD on 02/18/2019   02/24/2019 -  Chemotherapy   The patient had palonosetron (ALOXI) injection 0.25 mg, 0.25 mg, Intravenous,  Once, 0 of 4 cycles CISplatin (  PLATINOL) 125 mg in sodium chloride 0.9 % 500 mL chemo infusion, 75 mg/m2 = 125 mg (100 % of original dose 75 mg/m2), Intravenous,  Once, 0 of 4 cycles Dose modification: 75 mg/m2 (original dose 75 mg/m2, Cycle 1, Reason: Provider Judgment) etoposide (VEPESID) 170 mg in sodium chloride 0.9 % 500 mL chemo infusion, 100 mg/m2 = 170 mg, Intravenous,  Once, 0 of 4 cycles fosaprepitant (EMEND) 150 mg, dexamethasone (DECADRON) 12 mg in sodium chloride 0.9 % 145 mL IVPB, , Intravenous,  Once, 0 of 4 cycles  for chemotherapy treatment.      I have reviewed the past medical history, past surgical history, social history and family history with the patient and they are unchanged from previous note.  ALLERGIES:  has  No Known Allergies.  MEDICATIONS:  Current Outpatient Medications  Medication Sig Dispense Refill  . albuterol (PROVENTIL HFA;VENTOLIN HFA) 108 (90 Base) MCG/ACT inhaler Inhale 1-2 puffs into the lungs every 4 (four) hours as needed for wheezing or shortness of breath.     Marland Kitchen aspirin EC 325 MG tablet Take by mouth.    . Calcium Carb-Cholecalciferol (CALCIUM-VITAMIN D) 500-200 MG-UNIT tablet Take 1 tablet by mouth 2 (two) times daily.     Marland Kitchen dexamethasone (DECADRON) 4 MG tablet Take 2 tablets two times a day for 1 day on day 4 after cisplatin chemotherapy. Take with food. 30 tablet 1  . HYDROcodone-acetaminophen (NORCO) 7.5-325 MG tablet Take 1 tablet by mouth every 8 (eight) hours as needed for moderate pain. 60 tablet 0  . ibuprofen (ADVIL) 800 MG tablet Take 1 tablet (800 mg total) by mouth 3 (three) times daily. (Patient taking differently: Take 800 mg by mouth every 8 (eight) hours as needed (pain). ) 21 tablet 0  . lidocaine-prilocaine (EMLA) cream Apply to affected area once 30 g 3  . LORazepam (ATIVAN) 0.5 MG tablet Take 1 tablet (0.5 mg total) by mouth every 6 (six) hours as needed (Nausea or vomiting). 30 tablet 0  . morphine (MS CONTIN) 15 MG 12 hr tablet Take 1 tablet (15 mg total) by mouth every 12 (twelve) hours. 60 tablet 0  . ondansetron (ZOFRAN) 8 MG tablet Take 1 tablet (8 mg total) by mouth 2 (two) times daily as needed. Start on the third day after cisplatin chemotherapy. 30 tablet 1  . prochlorperazine (COMPAZINE) 10 MG tablet Take 1 tablet (10 mg total) by mouth every 6 (six) hours as needed (Nausea or vomiting). 30 tablet 1   No current facility-administered medications for this visit.     PHYSICAL EXAMINATION: ECOG PERFORMANCE STATUS: 1 - Symptomatic but completely ambulatory  Today's Vitals   02/23/19 0854 02/23/19 0900  BP: (!) 159/100   Pulse: 95   Resp: 17   Temp: (!) 97.4 F (36.3 C)   SpO2: 98%   Weight: 145 lb (65.8 kg)   PainSc:  0-No pain   Body mass  index is 28.32 kg/m.  Filed Weights   02/23/19 0854  Weight: 145 lb (65.8 kg)    GENERAL: alert, no distress and comfortable SKIN: skin color, texture, turgor are normal, no rashes or significant lesions EYES: conjunctiva are pink and non-injected, sclera clear OROPHARYNX: no exudate, no erythema; lips, buccal mucosa, and tongue normal  NECK: supple, non-tender LUNGS: clear to auscultation with normal breathing effort HEART: regular rate & rhythm and no murmurs and no lower extremity edema ABDOMEN: soft, non-tender, non-distended, normal bowel sounds Musculoskeletal: no cyanosis of digits and no clubbing  PSYCH:  alert & oriented x 3, fluent speech NEURO: no focal motor/sensory deficits  LABORATORY DATA:  I have reviewed the data as listed    Component Value Date/Time   NA 137 02/23/2019 0829   K 3.9 02/23/2019 0829   CL 103 02/23/2019 0829   CO2 24 02/23/2019 0829   GLUCOSE 122 (H) 02/23/2019 0829   BUN 13 02/23/2019 0829   CREATININE 0.92 02/23/2019 0829   CALCIUM 9.7 02/23/2019 0829   PROT 7.4 02/23/2019 0829   ALBUMIN 3.9 02/23/2019 0829   AST 35 02/23/2019 0829   ALT 48 (H) 02/23/2019 0829   ALKPHOS 99 02/23/2019 0829   BILITOT 0.4 02/23/2019 0829   GFRNONAA >60 02/23/2019 0829   GFRAA >60 02/23/2019 0829    No results found for: SPEP, UPEP  Lab Results  Component Value Date   WBC 5.5 02/23/2019   NEUTROABS 3.8 02/23/2019   HGB 13.0 02/23/2019   HCT 38.6 02/23/2019   MCV 86.2 02/23/2019   PLT 212 02/23/2019      Chemistry      Component Value Date/Time   NA 137 02/23/2019 0829   K 3.9 02/23/2019 0829   CL 103 02/23/2019 0829   CO2 24 02/23/2019 0829   BUN 13 02/23/2019 0829   CREATININE 0.92 02/23/2019 0829      Component Value Date/Time   CALCIUM 9.7 02/23/2019 0829   ALKPHOS 99 02/23/2019 0829   AST 35 02/23/2019 0829   ALT 48 (H) 02/23/2019 0829   BILITOT 0.4 02/23/2019 0829       RADIOGRAPHIC STUDIES: I have personally reviewed the  radiological images as listed below and agreed with the findings in the report. Dg Chest 2 View  Result Date: 01/27/2019 CLINICAL DATA:  Chest pain. EXAM: CHEST - 2 VIEW COMPARISON:  05/21/2018 and 04/29/2018 FINDINGS: Heart size is normal. There is fullness in the region of the azygos vein which could represent adenopathy. There appears to be abnormal density in that area particularly on the lateral view. There is diffuse accentuation of the interstitial markings to a greater degree than on the prior exams with slight atelectasis at the lung bases. No effusions or consolidative infiltrates. No acute bone abnormality. IMPRESSION: 1. Fullness in the region of the azygos vein. CT scan of the chest with contrast recommended for further evaluation. 2. Prominent interstitial markings with slight atelectasis at the lung bases. Some of the interstitial accentuation is chronic. Electronically Signed   By: Lorriane Shire M.D.   On: 01/27/2019 20:57   Ct Angio Chest Pe W And/or Wo Contrast  Result Date: 01/27/2019 CLINICAL DATA:  Chest pain EXAM: CT ANGIOGRAPHY CHEST WITH CONTRAST TECHNIQUE: Multidetector CT imaging of the chest was performed using the standard protocol during bolus administration of intravenous contrast. Multiplanar CT image reconstructions and MIPs were obtained to evaluate the vascular anatomy. CONTRAST:  180mL OMNIPAQUE IOHEXOL 350 MG/ML SOLN COMPARISON:  Chest radiograph January 27, 2019 FINDINGS: Cardiovascular: There is no demonstrable pulmonary embolus. There is no appreciable thoracic aortic aneurysm or dissection. The visualized great vessels appear normal. There is no pericardial effusion or pericardial thickening. Mediastinum/Nodes: Visualized thyroid appears normal. There is a mass arising in the right upper lobe extending into the superior hilar region measuring 3.1 x 2.5 cm. There is a right pretracheal lymph node measuring 2.0 x 1.8 cm. There is subcarinal adenopathy with the largest  individual lymph node in the subcarinal region measuring 1.5 x 1.4 cm. There is a lymph node in the mid  right hilar region measuring 1.8 x 1.4 cm. There are scattered smaller lymph nodes elsewhere in the mediastinum. There is a fairly small hiatal hernia. Lungs/Pleura: There is underlying emphysematous change with areas of fibrosis throughout the periphery of each lung. There is a mass arising in the right upper lobe extending into the right hilar region measuring 3.1 x 2.5 cm as noted above. There is an area of either mass or consolidation more peripherally in the right upper lobe anteriorly near the apex measuring 2.4 x 1.7 cm. There is atelectatic change in each lower lobe, more notable on the right than on the left. No pleural effusions are evident. Upper Abdomen: Visualized upper abdominal structures appear unremarkable. Musculoskeletal: There are no blastic or lytic bone lesions. No chest wall lesions are evident. Review of the MIP images confirms the above findings. IMPRESSION: 1. No demonstrable pulmonary embolus. No thoracic aortic aneurysm or dissection. 2. Mass arising in the right upper lobe extending into the right hilum measuring 3.1 x 2.5 cm. Questionable second mass in the right upper lobe more peripherally and anteriorly measuring 2.4 x 1.7 cm versus consolidation in this area. There is adenopathy in the right hilum and right paratracheal regions. Neoplastic involvement is felt to be present. It may be prudent to correlate with nuclear medicine PET study to further characterize. 3. Underlying emphysematous change with areas of fibrosis throughout the periphery of each lung. Fibrosis is somewhat evenly distributed between upper and lower lobe regions. There is atelectatic change in the lung bases, more on the right than on the left. Emphysema (ICD10-J43.9). Electronically Signed   By: Lowella Grip III M.D.   On: 01/27/2019 21:55   Mr Mckenzi Cos VO Contrast  Result Date: 02/07/2019 CLINICAL  DATA:  Lung cancer. Staging exam. No neurologic symptoms. EXAM: MRI HEAD WITHOUT AND WITH CONTRAST TECHNIQUE: Multiplanar, multiecho pulse sequences of the brain and surrounding structures were obtained without and with intravenous contrast. CONTRAST:  74mL MAGNEVIST GADOPENTETATE DIMEGLUMINE 469.01 MG/ML IV SOLN COMPARISON:  None. FINDINGS: Brain: No acute infarct, hemorrhage, or mass lesion is present. No significant white matter lesions are present. The ventricles are of normal size. No significant extraaxial fluid collection is present. The internal auditory canals are within normal limits. The brainstem and cerebellum are within normal limits. Postcontrast images demonstrate no pathologic enhancement. Vascular: Flow is present in the major intracranial arteries. Skull and upper cervical spine: The craniocervical junction is normal. Upper cervical spine is within normal limits. Marrow signal is unremarkable. Sinuses/Orbits: A left mastoid effusion is present. There is no obstructing nasopharyngeal lesion. The paranasal sinuses and mastoid air cells are otherwise clear. The globes and orbits are within normal limits. IMPRESSION: 1. Normal MRI appearance the brain. No evidence for metastatic disease to the brain. 2. Left mastoid effusion without obstructing nasopharyngeal lesion. Electronically Signed   By: San Morelle M.D.   On: 02/07/2019 20:59   Nm Pet Image Initial (pi) Skull Base To Thigh  Result Date: 02/06/2019 CLINICAL DATA:  Initial treatment strategy for lung mass. EXAM: NUCLEAR MEDICINE PET SKULL BASE TO THIGH TECHNIQUE: 7.4 mCi F-18 FDG was injected intravenously. Full-ring PET imaging was performed from the skull base to thigh after the radiotracer. CT data was obtained and used for attenuation correction and anatomic localization. Fasting blood glucose: 89 mg/dl COMPARISON:  CT chest 11/27/2018 FINDINGS: Mediastinal blood pool activity: SUV max 2.16 Liver activity: SUV max NA NECK: No  hypermetabolic lymph nodes in the neck. Incidental CT findings: none CHEST:  Right upper lobe perihilar lung mass is identified measuring 3.6 cm, image 60/4. SUV max is equal to 10.3. Tumor extends along the right mainstem bronchus into the right side of mediastinum. Within the lateral right apex there is a subpleural area of increased nodularity measuring approximately 2.8 x 1.5 cm within SUV max of 4.96. Hypermetabolic right paratracheal lymph node measures 1.7 cm and has an SUV max of 10.3. No hypermetabolic supraclavicular lymph nodes. No hypermetabolic left mediastinal or hilar lymph nodes. Incidental CT findings: Emphysema. Cardiac enlargement. Aortic atherosclerosis. ABDOMEN/PELVIS: No abnormal FDG uptake within the liver, pancreas, spleen, or adrenal glands. No hypermetabolic abdominopelvic lymph nodes. Incidental CT findings: IVC filter. Aortic atherosclerosis. Previous cholecystectomy SKELETON: No focal hypermetabolic activity to suggest skeletal metastasis. Incidental CT findings: Chronic posttraumatic deformity involves the left hip. IMPRESSION: Right upper lobe perihilar lung mass is intensely hypermetabolic compatible with primary bronchogenic carcinoma. Suspect mediastinal invasion with hypermetabolic right paratracheal adenopathy. Assuming a non-small cell neoplasm this is favored to represent a T4N1M0 lesion or stage IIIa disease. Indeterminate nodular area of subpleural consolidation in with thin the lateral right apex has an SUV max of 4.96. This may represent an area of postobstructive pneumonitis versus satellite lesion in same lobe. Aortic Atherosclerosis (ICD10-I70.0) and Emphysema (ICD10-J43.9). Electronically Signed   By: Kerby Moors M.D.   On: 02/06/2019 15:39   Ir Imaging Guided Port Insertion  Result Date: 02/18/2019 CLINICAL DATA:  Small-cell lung carcinoma and need for porta cath for chemotherapy. EXAM: IMPLANTED PORT A CATH PLACEMENT WITH ULTRASOUND AND FLUOROSCOPIC GUIDANCE  ANESTHESIA/SEDATION: 2.0 mg IV Versed; 100 mcg IV Fentanyl Total Moderate Sedation Time:  44 minutes The patient's level of consciousness and physiologic status were continuously monitored during the procedure by Radiology nursing. Additional Medications: 2 g IV Ancef. FLUOROSCOPY TIME:  1 minutes and 6 seconds.  7.2 mGy. PROCEDURE: The procedure, risks, benefits, and alternatives were explained to the patient. Questions regarding the procedure were encouraged and answered. The patient understands and consents to the procedure. A time-out was performed prior to initiating the procedure. Ultrasound was utilized to confirm patency of the left internal jugular vein. The left neck and chest were prepped with chlorhexidine in a sterile fashion, and a sterile drape was applied covering the operative field. Maximum barrier sterile technique with sterile gowns and gloves were used for the procedure. Local anesthesia was provided with 1% lidocaine. After creating a small venotomy incision, a 21 gauge needle was advanced into the left internal jugular vein under direct, real-time ultrasound guidance. Ultrasound image documentation was performed. After securing guidewire access, an 8 Fr dilator was placed. A J-wire was kinked to measure appropriate catheter length. A subcutaneous port pocket was then created along the upper chest wall utilizing sharp and blunt dissection. Portable cautery was utilized. The pocket was irrigated with sterile saline. A single lumen power injectable port was chosen for placement. The 8 Fr catheter was tunneled from the port pocket site to the venotomy incision. The port was placed in the pocket. External catheter was trimmed to appropriate length based on guidewire measurement. At the venotomy, an 8 Fr peel-away sheath was placed over a guidewire. The catheter was then placed through the sheath and the sheath removed. Final catheter positioning was confirmed and documented with a fluoroscopic spot  image. The port was accessed with a needle and aspirated and flushed with heparinized saline. The access needle was removed. The venotomy and port pocket incisions were closed with subcutaneous 3-0 Monocryl and subcuticular 4-0  Vicryl. Dermabond was applied to both incisions. COMPLICATIONS: COMPLICATIONS None FINDINGS: After catheter placement, the tip lies at the cavo-atrial junction. The catheter aspirates normally and is ready for immediate use. IMPRESSION: Placement of single lumen port a cath via left internal jugular vein. The catheter tip lies at the cavo-atrial junction. A power injectable port a cath was placed and is ready for immediate use. Electronically Signed   By: Aletta Edouard M.D.   On: 02/18/2019 16:26

## 2019-02-23 NOTE — Patient Instructions (Signed)
Patient in chemotherapy education class with  Husband.  Discussed side effects of Cisplatin, Etoposide which include but are not limited to myelosuppression, decreased appetite, fatigue, fever, allergic or infusional reaction, mucositis, cardiac toxicity, cough, SOB, altered taste, nausea and vomiting, diarrhea, constipation, elevated LFTs myalgia and arthralgias, hair loss or thinning, rash, skin dryness, nail changes, peripheral neuropathy, discolored urine, delayed wound healing, mental changes (Chemo brain), increased risk of infections, weight loss.  Reviewed infusion room and office policy and procedure and phone numbers 24 hours x 7 days a week.  Reviewed when to call the office with any concerns or problems.  Scientist, clinical (histocompatibility and immunogenetics) given.  Discussed portacath insertion and EMLA cream administration.  Antiemetic protocol and chemotherapy schedule reviewed. Patient verbalized understanding of chemotherapy indications and possible side effects.  Teachback done

## 2019-02-23 NOTE — Telephone Encounter (Signed)
Appointments already scheduled   / 11/16 los

## 2019-02-24 ENCOUNTER — Encounter: Payer: Self-pay | Admitting: *Deleted

## 2019-02-24 ENCOUNTER — Other Ambulatory Visit: Payer: Self-pay

## 2019-02-24 ENCOUNTER — Inpatient Hospital Stay: Payer: Medicaid Other

## 2019-02-24 ENCOUNTER — Other Ambulatory Visit: Payer: Self-pay | Admitting: Hematology

## 2019-02-24 VITALS — BP 128/93 | HR 65 | Temp 97.5°F | Resp 20

## 2019-02-24 DIAGNOSIS — Z5111 Encounter for antineoplastic chemotherapy: Secondary | ICD-10-CM | POA: Diagnosis not present

## 2019-02-24 DIAGNOSIS — C3490 Malignant neoplasm of unspecified part of unspecified bronchus or lung: Secondary | ICD-10-CM

## 2019-02-24 MED ORDER — SODIUM CHLORIDE 0.9 % IV SOLN
100.0000 mg/m2 | Freq: Once | INTRAVENOUS | Status: AC
  Administered 2019-02-24: 170 mg via INTRAVENOUS
  Filled 2019-02-24: qty 8.5

## 2019-02-24 MED ORDER — PALONOSETRON HCL INJECTION 0.25 MG/5ML
INTRAVENOUS | Status: AC
  Filled 2019-02-24: qty 5

## 2019-02-24 MED ORDER — POTASSIUM CHLORIDE 2 MEQ/ML IV SOLN
Freq: Once | INTRAVENOUS | Status: AC
  Administered 2019-02-24: 09:00:00 via INTRAVENOUS
  Filled 2019-02-24: qty 10

## 2019-02-24 MED ORDER — PALONOSETRON HCL INJECTION 0.25 MG/5ML
0.2500 mg | Freq: Once | INTRAVENOUS | Status: AC
  Administered 2019-02-24: 0.25 mg via INTRAVENOUS

## 2019-02-24 MED ORDER — SODIUM CHLORIDE 0.9 % IV SOLN
INTRAVENOUS | Status: DC
  Administered 2019-02-24: 11:00:00 via INTRAVENOUS
  Filled 2019-02-24: qty 250

## 2019-02-24 MED ORDER — SODIUM CHLORIDE 0.9 % IV SOLN
Freq: Once | INTRAVENOUS | Status: AC
  Administered 2019-02-24: 08:00:00 via INTRAVENOUS
  Filled 2019-02-24: qty 250

## 2019-02-24 MED ORDER — HEPARIN SOD (PORK) LOCK FLUSH 100 UNIT/ML IV SOLN
500.0000 [IU] | Freq: Once | INTRAVENOUS | Status: AC | PRN
  Administered 2019-02-24: 500 [IU]
  Filled 2019-02-24: qty 5

## 2019-02-24 MED ORDER — SODIUM CHLORIDE 0.9 % IV SOLN
75.0000 mg/m2 | Freq: Once | INTRAVENOUS | Status: AC
  Administered 2019-02-24: 125 mg via INTRAVENOUS
  Filled 2019-02-24: qty 50

## 2019-02-24 MED ORDER — SODIUM CHLORIDE 0.9 % IV SOLN
Freq: Once | INTRAVENOUS | Status: AC
  Administered 2019-02-24: 12:00:00 via INTRAVENOUS
  Filled 2019-02-24: qty 5

## 2019-02-24 MED ORDER — SODIUM CHLORIDE 0.9% FLUSH
10.0000 mL | INTRAVENOUS | Status: DC | PRN
  Administered 2019-02-24: 10 mL
  Filled 2019-02-24: qty 10

## 2019-02-24 NOTE — Progress Notes (Signed)
Visited with patient in the infusion room. Today is the initiation of her systemic treatment.   She feels well and is ready to start treatment. She had her chemo education class yesterday. She feels ready to start and knows to speak to her nurse if she has any issues during her infusion, but to also call the office with any questions or concerns after her treatment.   Spoke to patient about a nutrition consult. She will see our nutritionist at the beginning of her 2nd cycle.

## 2019-02-24 NOTE — Patient Instructions (Signed)
[facility] Discharge Instructions for Patients Receiving Chemotherapy  Today you received the following chemotherapy agents:  Cisplatin and Etoposide  To help prevent nausea and vomiting after your treatment, we encourage you to take your nausea medication as ordered per MD.    If you develop nausea and vomiting that is not controlled by your nausea medication, call the clinic.   BELOW ARE SYMPTOMS THAT SHOULD BE REPORTED IMMEDIATELY:  *FEVER GREATER THAN 100.5 F  *CHILLS WITH OR WITHOUT FEVER  NAUSEA AND VOMITING THAT IS NOT CONTROLLED WITH YOUR NAUSEA MEDICATION  *UNUSUAL SHORTNESS OF BREATH  *UNUSUAL BRUISING OR BLEEDING  TENDERNESS IN MOUTH AND THROAT WITH OR WITHOUT PRESENCE OF ULCERS  *URINARY PROBLEMS  *BOWEL PROBLEMS  UNUSUAL RASH Items with * indicate a potential emergency and should be followed up as soon as possible.  Feel free to call the clinic should you have any questions or concerns. The clinic phone number is [phone].  Please show the [facility] at check-in to the Emergency Department and triage nurse.

## 2019-02-25 ENCOUNTER — Other Ambulatory Visit: Payer: Self-pay

## 2019-02-25 ENCOUNTER — Inpatient Hospital Stay: Payer: Medicaid Other

## 2019-02-25 VITALS — BP 162/100 | HR 87 | Temp 98.9°F | Resp 17

## 2019-02-25 DIAGNOSIS — Z5111 Encounter for antineoplastic chemotherapy: Secondary | ICD-10-CM | POA: Diagnosis not present

## 2019-02-25 DIAGNOSIS — R197 Diarrhea, unspecified: Secondary | ICD-10-CM

## 2019-02-25 DIAGNOSIS — E860 Dehydration: Secondary | ICD-10-CM

## 2019-02-25 DIAGNOSIS — Z95828 Presence of other vascular implants and grafts: Secondary | ICD-10-CM

## 2019-02-25 DIAGNOSIS — C3490 Malignant neoplasm of unspecified part of unspecified bronchus or lung: Secondary | ICD-10-CM

## 2019-02-25 DIAGNOSIS — R110 Nausea: Secondary | ICD-10-CM

## 2019-02-25 DIAGNOSIS — R519 Headache, unspecified: Secondary | ICD-10-CM

## 2019-02-25 MED ORDER — SODIUM CHLORIDE 0.9 % IV SOLN
Freq: Once | INTRAVENOUS | Status: AC
  Administered 2019-02-25: 14:00:00 via INTRAVENOUS
  Filled 2019-02-25: qty 250

## 2019-02-25 MED ORDER — SODIUM CHLORIDE 0.9 % IV SOLN
100.0000 mg/m2 | Freq: Once | INTRAVENOUS | Status: AC
  Administered 2019-02-25: 170 mg via INTRAVENOUS
  Filled 2019-02-25: qty 8.5

## 2019-02-25 MED ORDER — DIPHENOXYLATE-ATROPINE 2.5-0.025 MG PO TABS
ORAL_TABLET | ORAL | Status: AC
  Filled 2019-02-25: qty 1

## 2019-02-25 MED ORDER — DIPHENOXYLATE-ATROPINE 2.5-0.025 MG PO TABS
2.0000 | ORAL_TABLET | Freq: Once | ORAL | Status: AC
  Administered 2019-02-25: 2 via ORAL

## 2019-02-25 MED ORDER — SODIUM CHLORIDE 0.9 % IV SOLN: 8.0000 mg | Freq: Once | INTRAVENOUS | Status: DC

## 2019-02-25 MED ORDER — DIPHENHYDRAMINE HCL 50 MG/ML IJ SOLN
INTRAMUSCULAR | Status: AC
  Filled 2019-02-25: qty 1

## 2019-02-25 MED ORDER — SODIUM CHLORIDE 0.9% FLUSH
10.0000 mL | INTRAVENOUS | Status: DC | PRN
  Administered 2019-02-25 (×2): 10 mL
  Filled 2019-02-25: qty 10

## 2019-02-25 MED ORDER — SODIUM CHLORIDE 0.9 % IV SOLN: 10.0000 mg | Freq: Once | INTRAVENOUS | Status: DC

## 2019-02-25 MED ORDER — HOT PACK MISC ONCOLOGY
1.0000 | Freq: Once | Status: DC | PRN
  Filled 2019-02-25: qty 1

## 2019-02-25 MED ORDER — SODIUM CHLORIDE 0.9% FLUSH
10.0000 mL | INTRAVENOUS | Status: DC | PRN
  Filled 2019-02-25: qty 10

## 2019-02-25 MED ORDER — HYDROCODONE-ACETAMINOPHEN 5-325 MG PO TABS: 2.0000 | ORAL_TABLET | Freq: Once | ORAL | Status: DC

## 2019-02-25 MED ORDER — HEPARIN SOD (PORK) LOCK FLUSH 100 UNIT/ML IV SOLN
500.0000 [IU] | Freq: Once | INTRAVENOUS | Status: AC | PRN
  Administered 2019-02-25: 500 [IU]
  Filled 2019-02-25: qty 5

## 2019-02-25 MED ORDER — DEXAMETHASONE SODIUM PHOSPHATE 10 MG/ML IJ SOLN
10.0000 mg | Freq: Once | INTRAMUSCULAR | Status: AC
  Administered 2019-02-25: 10 mg via INTRAVENOUS

## 2019-02-25 MED ORDER — DEXAMETHASONE SODIUM PHOSPHATE 10 MG/ML IJ SOLN
INTRAMUSCULAR | Status: AC
  Filled 2019-02-25: qty 1

## 2019-02-25 MED ORDER — DIPHENHYDRAMINE HCL 50 MG/ML IJ SOLN
50.0000 mg | Freq: Once | INTRAMUSCULAR | Status: AC
  Administered 2019-02-25: 50 mg via INTRAVENOUS

## 2019-02-25 NOTE — Progress Notes (Signed)
OK to treat with today's vital signs per Laverna Peace, NP who got instructions from Dr. Maylon Peppers via secure chat.

## 2019-02-25 NOTE — Patient Instructions (Signed)
Etoposide, VP-16 capsules What is this medicine? ETOPOSIDE, VP-16 (e toe POE side) is a chemotherapy drug. It is used to treat small cell lung cancer and other cancers. This medicine may be used for other purposes; ask your health care provider or pharmacist if you have questions. COMMON BRAND NAME(S): VePesid What should I tell my health care provider before I take this medicine? They need to know if you have any of these conditions:  infection  kidney disease  liver disease  low blood counts, like low white cell, platelet, or red cell counts  an unusual or allergic reaction to etoposide, other medicines, foods, dyes, or preservatives  pregnant or trying to get pregnant  breast-feeding How should I use this medicine? Take this medicine by mouth with a glass of water. Follow the directions on the prescription label. Do not open, crush, or chew the capsules. It is advisable to wear gloves when handling this medicine. Take your medicine at regular intervals. Do not take it more often than directed. Do not stop taking except on your doctor's advice. Talk to your pediatrician regarding the use of this medicine in children. Special care may be needed. Overdosage: If you think you have taken too much of this medicine contact a poison control center or emergency room at once. NOTE: This medicine is only for you. Do not share this medicine with others. What if I miss a dose? If you miss a dose, take it as soon as you can. If it is almost time for your next dose, take only that dose. Do not take double or extra doses. What may interact with this medicine?  aspirin  certain medications for seizures like carbamazepine, phenobarbital, phenytoin, valproic acid  cyclosporine  levamisole  valproic acid  warfarin This list may not describe all possible interactions. Give your health care provider a list of all the medicines, herbs, non-prescription drugs, or dietary supplements you use. Also  tell them if you smoke, drink alcohol, or use illegal drugs. Some items may interact with your medicine. What should I watch for while using this medicine? Visit your doctor for checks on your progress. This drug may make you feel generally unwell. This is not uncommon, as chemotherapy can affect healthy cells as well as cancer cells. Report any side effects. Continue your course of treatment even though you feel ill unless your doctor tells you to stop. In some cases, you may be given additional medicines to help with side effects. Follow all directions for their use. Call your doctor or health care professional for advice if you get a fever, chills or sore throat, or other symptoms of a cold or flu. Do not treat yourself. This drug decreases your body's ability to fight infections. Try to avoid being around people who are sick. This medicine may increase your risk to bruise or bleed. Call your doctor or health care professional if you notice any unusual bleeding. Talk to your doctor about your risk of cancer. You may be more at risk for certain types of cancers if you take this medicine. Do not become pregnant while taking this medicine or for at least 6 months after stopping it. Women should inform their doctor if they wish to become pregnant or think they might be pregnant. Women of child-bearing potential will need to have a negative pregnancy test before starting this medicine. There is a potential for serious side effects to an unborn child. Talk to your health care professional or pharmacist for more information.  Do not breast-feed an infant while taking this medicine. Men must use a latex condom during sexual contact with a woman while taking this medicine and for at least 4 months after stopping it. A latex condom is needed even if you have had a vasectomy. Contact your doctor right away if your partner becomes pregnant. Do not donate sperm while taking this medicine and for 4 months after you stop  taking this medicine. Men should inform their doctors if they wish to father a child. This medicine may lower sperm counts. What side effects may I notice from receiving this medicine? Side effects that you should report to your doctor or health care professional as soon as possible:  allergic reactions like skin rash, itching or hives, swelling of the face, lips, or tongue  low blood counts - this medicine may decrease the number of white blood cells, red blood cells and platelets. You may be at increased risk for infections and bleeding.  signs of infection - fever or chills, cough, sore throat, pain or difficulty passing urine  signs of decreased platelets or bleeding - bruising, pinpoint red spots on the skin, black, tarry stools, blood in the urine  signs of decreased red blood cells - unusually weak or tired, fainting spells, lightheadedness  breathing problems  changes in vision  mouth or throat sores or ulcers  pain, tingling, numbness in the hands or feet  redness, blistering, peeling or loosening of the skin, including inside the mouth  seizures  vomiting Side effects that usually do not require medical attention (report to your doctor or health care professional if they continue or are bothersome):  change in taste  diarrhea  hair loss  nausea  stomach pain This list may not describe all possible side effects. Call your doctor for medical advice about side effects. You may report side effects to FDA at 1-800-FDA-1088. Where should I keep my medicine? Keep out of the reach of children. Store in a refrigerator between 2 and 8 degrees C (36 and 46 degrees F). Do not freeze. Throw away any unused medicine after the expiration date. NOTE: This sheet is a summary. It may not cover all possible information. If you have questions about this medicine, talk to your doctor, pharmacist, or health care provider.  2020 [employer]vier/Gold Standard (2015-03-18 11:49:52)

## 2019-02-26 ENCOUNTER — Other Ambulatory Visit: Payer: Self-pay

## 2019-02-26 ENCOUNTER — Inpatient Hospital Stay: Payer: Medicaid Other

## 2019-02-26 ENCOUNTER — Encounter: Payer: Self-pay | Admitting: *Deleted

## 2019-02-26 VITALS — BP 154/98 | HR 93 | Temp 98.7°F | Resp 19

## 2019-02-26 DIAGNOSIS — Z5111 Encounter for antineoplastic chemotherapy: Secondary | ICD-10-CM | POA: Diagnosis not present

## 2019-02-26 DIAGNOSIS — C3490 Malignant neoplasm of unspecified part of unspecified bronchus or lung: Secondary | ICD-10-CM

## 2019-02-26 MED ORDER — DEXAMETHASONE SODIUM PHOSPHATE 10 MG/ML IJ SOLN
10.0000 mg | Freq: Once | INTRAMUSCULAR | Status: AC
  Administered 2019-02-26: 10 mg via INTRAVENOUS

## 2019-02-26 MED ORDER — SODIUM CHLORIDE 0.9 % IV SOLN
Freq: Once | INTRAVENOUS | Status: AC
  Administered 2019-02-26: 13:00:00 via INTRAVENOUS
  Filled 2019-02-26: qty 250

## 2019-02-26 MED ORDER — HEPARIN SOD (PORK) LOCK FLUSH 100 UNIT/ML IV SOLN
500.0000 [IU] | Freq: Once | INTRAVENOUS | Status: AC | PRN
  Administered 2019-02-26: 500 [IU]
  Filled 2019-02-26: qty 5

## 2019-02-26 MED ORDER — SODIUM CHLORIDE 0.9 % IV SOLN
100.0000 mg/m2 | Freq: Once | INTRAVENOUS | Status: AC
  Administered 2019-02-26: 170 mg via INTRAVENOUS
  Filled 2019-02-26: qty 8.5

## 2019-02-26 MED ORDER — DEXAMETHASONE SODIUM PHOSPHATE 10 MG/ML IJ SOLN
INTRAMUSCULAR | Status: AC
  Filled 2019-02-26: qty 1

## 2019-02-26 MED ORDER — SODIUM CHLORIDE 0.9% FLUSH
10.0000 mL | INTRAVENOUS | Status: DC | PRN
  Administered 2019-02-26: 10 mL
  Filled 2019-02-26: qty 10

## 2019-02-26 NOTE — Patient Instructions (Signed)
Etoposide, VP-16 injection What is this medicine? ETOPOSIDE, VP-16 (e toe POE side) is a chemotherapy drug. It is used to treat testicular cancer, lung cancer, and other cancers. This medicine may be used for other purposes; ask your health care provider or pharmacist if you have questions. COMMON BRAND NAME(S): Etopophos, Toposar, VePesid What should I tell my health care provider before I take this medicine? They need to know if you have any of these conditions:  infection  kidney disease  liver disease  low blood counts, like low white cell, platelet, or red cell counts  an unusual or allergic reaction to etoposide, other medicines, foods, dyes, or preservatives  pregnant or trying to get pregnant  breast-feeding How should I use this medicine? This medicine is for infusion into a vein. It is administered in a hospital or clinic by a specially trained health care professional. Talk to your pediatrician regarding the use of this medicine in children. Special care may be needed. Overdosage: If you think you have taken too much of this medicine contact a poison control center or emergency room at once. NOTE: This medicine is only for you. Do not share this medicine with others. What if I miss a dose? It is important not to miss your dose. Call your doctor or health care professional if you are unable to keep an appointment. What may interact with this medicine?  aspirin  certain medications for seizures like carbamazepine, phenobarbital, phenytoin, valproic acid  cyclosporine  levamisole  warfarin This list may not describe all possible interactions. Give your health care provider a list of all the medicines, herbs, non-prescription drugs, or dietary supplements you use. Also tell them if you smoke, drink alcohol, or use illegal drugs. Some items may interact with your medicine. What should I watch for while using this medicine? Visit your doctor for checks on your progress.  This drug may make you feel generally unwell. This is not uncommon, as chemotherapy can affect healthy cells as well as cancer cells. Report any side effects. Continue your course of treatment even though you feel ill unless your doctor tells you to stop. In some cases, you may be given additional medicines to help with side effects. Follow all directions for their use. Call your doctor or health care professional for advice if you get a fever, chills or sore throat, or other symptoms of a cold or flu. Do not treat yourself. This drug decreases your body's ability to fight infections. Try to avoid being around people who are sick. This medicine may increase your risk to bruise or bleed. Call your doctor or health care professional if you notice any unusual bleeding. Talk to your doctor about your risk of cancer. You may be more at risk for certain types of cancers if you take this medicine. Do not become pregnant while taking this medicine or for at least 6 months after stopping it. Women should inform their doctor if they wish to become pregnant or think they might be pregnant. Women of child-bearing potential will need to have a negative pregnancy test before starting this medicine. There is a potential for serious side effects to an unborn child. Talk to your health care professional or pharmacist for more information. Do not breast-feed an infant while taking this medicine. Men must use a latex condom during sexual contact with a woman while taking this medicine and for at least 4 months after stopping it. A latex condom is needed even if you have had a  vasectomy. Contact your doctor right away if your partner becomes pregnant. Do not donate sperm while taking this medicine and for at least 4 months after you stop taking this medicine. Men should inform their doctors if they wish to father a child. This medicine may lower sperm counts. What side effects may I notice from receiving this medicine? Side  effects that you should report to your doctor or health care professional as soon as possible:  allergic reactions like skin rash, itching or hives, swelling of the face, lips, or tongue  low blood counts - this medicine may decrease the number of white blood cells, red blood cells and platelets. You may be at increased risk for infections and bleeding.  signs of infection - fever or chills, cough, sore throat, pain or difficulty passing urine  signs of decreased platelets or bleeding - bruising, pinpoint red spots on the skin, black, tarry stools, blood in the urine  signs of decreased red blood cells - unusually weak or tired, fainting spells, lightheadedness  breathing problems  changes in vision  mouth or throat sores or ulcers  pain, redness, swelling or irritation at the injection site  pain, tingling, numbness in the hands or feet  redness, blistering, peeling or loosening of the skin, including inside the mouth  seizures  vomiting Side effects that usually do not require medical attention (report to your doctor or health care professional if they continue or are bothersome):  diarrhea  hair loss  loss of appetite  nausea  stomach pain This list may not describe all possible side effects. Call your doctor for medical advice about side effects. You may report side effects to FDA at 1-800-FDA-1088. Where should I keep my medicine? This drug is given in a hospital or clinic and will not be stored at home. NOTE: This sheet is a summary. It may not cover all possible information. If you have questions about this medicine, talk to your doctor, pharmacist, or health care provider.  2020 [employer]vier/Gold Standard (2015-03-18 11:53:23)

## 2019-02-27 ENCOUNTER — Other Ambulatory Visit: Payer: Self-pay | Admitting: *Deleted

## 2019-02-27 DIAGNOSIS — C3490 Malignant neoplasm of unspecified part of unspecified bronchus or lung: Secondary | ICD-10-CM

## 2019-02-27 MED ORDER — PROCHLORPERAZINE MALEATE 10 MG PO TABS: 10.0000 mg | ORAL_TABLET | Freq: Four times a day (QID) | ORAL | 1 refills | Status: DC | PRN

## 2019-02-27 MED FILL — PROCHLORPERAZINE 10 MG TAB: 10 | 7 days supply | Qty: 30 | Fill #0

## 2019-02-28 ENCOUNTER — Encounter: Payer: Self-pay | Admitting: *Deleted

## 2019-03-02 ENCOUNTER — Other Ambulatory Visit: Payer: Self-pay | Admitting: Hematology

## 2019-03-02 ENCOUNTER — Encounter: Payer: Self-pay | Admitting: *Deleted

## 2019-03-02 DIAGNOSIS — K521 Toxic gastroenteritis and colitis: Secondary | ICD-10-CM

## 2019-03-02 MED ORDER — DIPHENOXYLATE-ATROPINE 2.5-0.025 MG PO TABS: 2.0000 | ORAL_TABLET | Freq: Four times a day (QID) | ORAL | 0 refills | Status: DC | PRN

## 2019-03-02 MED FILL — DIPHENOXYLATE-ATROPINE 2.5-: 2.5-0.025 | 4 days supply | Qty: 30 | Fill #0

## 2019-03-02 NOTE — Progress Notes (Signed)
Received a My Chart message from over the weekend where patient was having difficulty with diarrhea. Tried to call patient at 8:45a to assess her current condition. No answer. Message left requesting call back.  Reviewed with Dr Maylon Peppers. If patient would like, she can come in this afternoon for IVF. He will also send a prescription for lomotil.   Retried patient at 10:25a with still no answer.  Message sent via My Chart requesting that patient return my call.  1:32p patient returned my call. She is feeling better today. Her diarrhea has stopped after 7 doses of the imodium. She feels worn out today. Reviewed with her keeping up with oral fluid intake. Also let her know that Dr Maylon Peppers sent in a prescription for lomotil that she could take if the diarrhea returned. She stated she would pick that up. Also recommended that she utilize the on call service so she doesn't go days without response. She stated she did, but she was told and RN would call her back and never did. I will forward this onto our clinic director.   She doesn't need to come in today for fluids. She will let us, or on-call know if she develops any further symptoms or return of diarrhea which is uncontrolled with lomotil.

## 2019-03-03 ENCOUNTER — Ambulatory Visit: Payer: Self-pay | Admitting: Hematology

## 2019-03-03 ENCOUNTER — Ambulatory Visit: Payer: Self-pay

## 2019-03-03 ENCOUNTER — Encounter: Payer: Self-pay | Admitting: Nutrition

## 2019-03-03 ENCOUNTER — Other Ambulatory Visit: Payer: Self-pay

## 2019-03-04 ENCOUNTER — Encounter: Payer: Self-pay | Admitting: *Deleted

## 2019-03-04 NOTE — Telephone Encounter (Signed)
Thank you. If you don't mind trying one time, that'd be great, just to make sure she doesn't end up in the ER.  Dr. Maylon Peppers

## 2019-03-09 ENCOUNTER — Encounter: Payer: Self-pay | Admitting: *Deleted

## 2019-03-10 ENCOUNTER — Other Ambulatory Visit: Payer: Self-pay

## 2019-03-10 ENCOUNTER — Ambulatory Visit: Payer: Self-pay

## 2019-03-10 ENCOUNTER — Ambulatory Visit: Payer: Self-pay | Admitting: Hematology

## 2019-03-11 ENCOUNTER — Other Ambulatory Visit: Payer: Self-pay

## 2019-03-11 ENCOUNTER — Encounter: Payer: Self-pay | Admitting: *Deleted

## 2019-03-11 ENCOUNTER — Ambulatory Visit: Payer: Self-pay

## 2019-03-11 MED ORDER — Medication
750.00 | Status: DC
Start: 2019-03-13 — End: 2019-03-11

## 2019-03-11 MED ORDER — QUINERVA 260 MG PO TABS
650.00 | ORAL_TABLET | ORAL | Status: DC
Start: ? — End: 2019-03-11

## 2019-03-11 MED ORDER — SODIUM TETRADECYL SULFATE 3 % IV SOLN
2.00 | INTRAVENOUS | Status: DC
Start: ? — End: 2019-03-11

## 2019-03-11 MED ORDER — ACETAMINOPHEN 80 MG PO CPSP
0.50 | ORAL_CAPSULE | ORAL | Status: DC
Start: ? — End: 2019-03-11

## 2019-03-11 MED ORDER — PAIN PM EXTRA STRENGTH 500-25 MG PO TABS
2.00 | ORAL_TABLET | ORAL | Status: DC
Start: ? — End: 2019-03-11

## 2019-03-11 MED ORDER — ARMOUR THYROID 30 MG PO TABS
2.00 | ORAL_TABLET | ORAL | Status: DC
Start: ? — End: 2019-03-11

## 2019-03-11 MED ORDER — NULLO 33.3 MG OR TABS
15.00 | ORAL_TABLET | ORAL | Status: DC
Start: 2019-03-16 — End: 2019-03-11

## 2019-03-11 MED ORDER — EQUATE NICOTINE 4 MG MT GUM
4.00 | CHEWING_GUM | OROMUCOSAL | Status: DC
Start: ? — End: 2019-03-11

## 2019-03-11 MED ORDER — Medication
2.00 | Status: DC
Start: 2019-03-13 — End: 2019-03-11

## 2019-03-11 MED ORDER — Medication
1.00 | Status: DC
Start: 2019-03-17 — End: 2019-03-11

## 2019-03-11 MED ORDER — HYDROCODONE-ACETAMINOPHEN 10-325 MG PO TABS
1.00 | ORAL_TABLET | ORAL | Status: DC
Start: ? — End: 2019-03-11

## 2019-03-13 MED ORDER — IPECAC PO
60.00 | ORAL | Status: DC
Start: ? — End: 2019-03-13

## 2019-03-13 MED ORDER — HEPARIN SOD (PORCINE) IN D5W 100 UNIT/ML IV SOLN
30.00 | INTRAVENOUS | Status: DC
Start: ? — End: 2019-03-13

## 2019-03-13 MED ORDER — HEPARIN SOD (PORCINE) IN D5W 100 UNIT/ML IV SOLN
12.00 | INTRAVENOUS | Status: DC
Start: ? — End: 2019-03-13

## 2019-03-16 ENCOUNTER — Other Ambulatory Visit: Payer: Self-pay | Admitting: *Deleted

## 2019-03-16 ENCOUNTER — Other Ambulatory Visit: Payer: Self-pay

## 2019-03-16 ENCOUNTER — Ambulatory Visit: Payer: Self-pay | Admitting: Hematology

## 2019-03-16 ENCOUNTER — Ambulatory Visit: Payer: Self-pay

## 2019-03-16 DIAGNOSIS — C3481 Malignant neoplasm of overlapping sites of right bronchus and lung: Secondary | ICD-10-CM

## 2019-03-16 MED ORDER — HYDROCODONE-ACETAMINOPHEN 7.5-325 MG PO TABS: 1.0000 | ORAL_TABLET | Freq: Three times a day (TID) | ORAL | 0 refills | Status: DC | PRN

## 2019-03-16 MED FILL — HYDROCODON-APAP 7.5-325: 7.5-325 | 20 days supply | Qty: 60 | Fill #0

## 2019-03-17 ENCOUNTER — Other Ambulatory Visit: Payer: Self-pay

## 2019-03-17 ENCOUNTER — Ambulatory Visit: Payer: Self-pay | Admitting: Hematology

## 2019-03-17 ENCOUNTER — Ambulatory Visit: Payer: Self-pay

## 2019-03-17 ENCOUNTER — Inpatient Hospital Stay: Payer: Medicaid Other

## 2019-03-17 ENCOUNTER — Encounter: Payer: Self-pay | Admitting: Nutrition

## 2019-03-17 ENCOUNTER — Inpatient Hospital Stay: Payer: Medicaid Other | Admitting: Nutrition

## 2019-03-17 ENCOUNTER — Inpatient Hospital Stay: Payer: Medicaid Other | Admitting: Hematology

## 2019-03-17 ENCOUNTER — Inpatient Hospital Stay: Payer: Medicaid Other | Attending: Hematology | Admitting: Nutrition

## 2019-03-17 DIAGNOSIS — Z7982 Long term (current) use of aspirin: Secondary | ICD-10-CM | POA: Insufficient documentation

## 2019-03-17 DIAGNOSIS — Z5111 Encounter for antineoplastic chemotherapy: Secondary | ICD-10-CM | POA: Insufficient documentation

## 2019-03-17 DIAGNOSIS — D6481 Anemia due to antineoplastic chemotherapy: Secondary | ICD-10-CM | POA: Insufficient documentation

## 2019-03-17 DIAGNOSIS — Z7901 Long term (current) use of anticoagulants: Secondary | ICD-10-CM | POA: Insufficient documentation

## 2019-03-17 DIAGNOSIS — G893 Neoplasm related pain (acute) (chronic): Secondary | ICD-10-CM | POA: Insufficient documentation

## 2019-03-17 DIAGNOSIS — D61818 Other pancytopenia: Secondary | ICD-10-CM | POA: Insufficient documentation

## 2019-03-17 DIAGNOSIS — Z7952 Long term (current) use of systemic steroids: Secondary | ICD-10-CM | POA: Insufficient documentation

## 2019-03-17 DIAGNOSIS — I82290 Acute embolism and thrombosis of other thoracic veins: Secondary | ICD-10-CM | POA: Insufficient documentation

## 2019-03-17 DIAGNOSIS — B370 Candidal stomatitis: Secondary | ICD-10-CM | POA: Insufficient documentation

## 2019-03-17 DIAGNOSIS — J439 Emphysema, unspecified: Secondary | ICD-10-CM | POA: Insufficient documentation

## 2019-03-17 DIAGNOSIS — Z791 Long term (current) use of non-steroidal anti-inflammatories (NSAID): Secondary | ICD-10-CM | POA: Insufficient documentation

## 2019-03-17 DIAGNOSIS — R519 Headache, unspecified: Secondary | ICD-10-CM | POA: Insufficient documentation

## 2019-03-17 DIAGNOSIS — T451X5A Adverse effect of antineoplastic and immunosuppressive drugs, initial encounter: Secondary | ICD-10-CM | POA: Insufficient documentation

## 2019-03-17 DIAGNOSIS — Z79899 Other long term (current) drug therapy: Secondary | ICD-10-CM | POA: Insufficient documentation

## 2019-03-17 DIAGNOSIS — D701 Agranulocytosis secondary to cancer chemotherapy: Secondary | ICD-10-CM | POA: Insufficient documentation

## 2019-03-17 DIAGNOSIS — C3411 Malignant neoplasm of upper lobe, right bronchus or lung: Secondary | ICD-10-CM | POA: Insufficient documentation

## 2019-03-17 MED ORDER — Medication
5.00 | Status: DC
Start: 2019-03-16 — End: 2019-03-17

## 2019-03-17 MED ORDER — APIXABAN 5 MG PO TABS
5.00 | ORAL_TABLET | ORAL | Status: DC
Start: 2019-03-16 — End: 2019-03-17

## 2019-03-17 NOTE — Progress Notes (Signed)
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 She is a patient of Dr. Maylon Peppers.  PMH includes Tobacco, HTN, Emphysema, and Depression.  Medications include calcium, Vitamin D, Decadron, Lomotil, Ativan, Zofran and Compazine.  Labs include Glucose 122 on Nov 16.  Height: 5'0" Weight: 145 pounds on Nov 16. UBW: 145-150 pounds. BMI: 28.32.  Patient reports she has a good appetite and is eating well. She denies weight loss. Reports nausea and diarrhea after first chemotherapy. Nausea medications were effective. She has tried both Ensure and Boost but prefers the Boost.  Nutrition Diagnosis: Food and Nutrition Related Knowledge Deficit related to new diagnosis of Lung cancer as evidenced by no prior need for nutrition related information.  Intervention: Patient educated to consume small frequent meals and snacks with high protein foods 6 times daily. Recommended one Boost daily but increase to 2-3 daily if oral intake declines or she experiences excessive of diarrhea. Educated on foods to eat to improve diarrhea. Encouraged adequate fluid intake. Mail fact sheets to patient at home address. Provided contact information for questions or concerns.  Monitoring, Evaluation, Goals" Patient will tolerate increased calories and protein for weight maintenance and minimal side effects.  Next Visit: Patient will call with questions or concerns.

## 2019-03-18 ENCOUNTER — Other Ambulatory Visit: Payer: Self-pay | Admitting: Hematology

## 2019-03-18 ENCOUNTER — Inpatient Hospital Stay: Payer: Medicaid Other

## 2019-03-19 ENCOUNTER — Inpatient Hospital Stay: Payer: Medicaid Other

## 2019-03-24 ENCOUNTER — Other Ambulatory Visit: Payer: Self-pay

## 2019-03-24 ENCOUNTER — Other Ambulatory Visit: Payer: Self-pay | Admitting: Family

## 2019-03-24 ENCOUNTER — Telehealth: Payer: Self-pay | Admitting: Hematology

## 2019-03-24 ENCOUNTER — Inpatient Hospital Stay: Payer: Medicaid Other

## 2019-03-24 ENCOUNTER — Encounter: Payer: Self-pay | Admitting: *Deleted

## 2019-03-24 ENCOUNTER — Inpatient Hospital Stay (HOSPITAL_BASED_OUTPATIENT_CLINIC_OR_DEPARTMENT_OTHER): Payer: Medicaid Other | Admitting: Hematology

## 2019-03-24 ENCOUNTER — Ambulatory Visit: Payer: Self-pay | Admitting: Hematology

## 2019-03-24 ENCOUNTER — Encounter: Payer: Self-pay | Admitting: Hematology

## 2019-03-24 ENCOUNTER — Other Ambulatory Visit: Payer: Self-pay | Admitting: Hematology

## 2019-03-24 ENCOUNTER — Ambulatory Visit: Payer: Self-pay

## 2019-03-24 VITALS — BP 142/98 | HR 108 | Temp 97.8°F | Wt 139.8 lb

## 2019-03-24 DIAGNOSIS — G893 Neoplasm related pain (acute) (chronic): Secondary | ICD-10-CM

## 2019-03-24 DIAGNOSIS — Z7901 Long term (current) use of anticoagulants: Secondary | ICD-10-CM | POA: Diagnosis not present

## 2019-03-24 DIAGNOSIS — D6481 Anemia due to antineoplastic chemotherapy: Secondary | ICD-10-CM | POA: Insufficient documentation

## 2019-03-24 DIAGNOSIS — R918 Other nonspecific abnormal finding of lung field: Secondary | ICD-10-CM

## 2019-03-24 DIAGNOSIS — T451X5A Adverse effect of antineoplastic and immunosuppressive drugs, initial encounter: Secondary | ICD-10-CM

## 2019-03-24 DIAGNOSIS — Z7952 Long term (current) use of systemic steroids: Secondary | ICD-10-CM | POA: Diagnosis not present

## 2019-03-24 DIAGNOSIS — C3490 Malignant neoplasm of unspecified part of unspecified bronchus or lung: Secondary | ICD-10-CM

## 2019-03-24 DIAGNOSIS — I82290 Acute embolism and thrombosis of other thoracic veins: Secondary | ICD-10-CM | POA: Diagnosis not present

## 2019-03-24 DIAGNOSIS — Z7982 Long term (current) use of aspirin: Secondary | ICD-10-CM | POA: Diagnosis not present

## 2019-03-24 DIAGNOSIS — D701 Agranulocytosis secondary to cancer chemotherapy: Secondary | ICD-10-CM | POA: Insufficient documentation

## 2019-03-24 DIAGNOSIS — Z791 Long term (current) use of non-steroidal anti-inflammatories (NSAID): Secondary | ICD-10-CM | POA: Diagnosis not present

## 2019-03-24 DIAGNOSIS — J439 Emphysema, unspecified: Secondary | ICD-10-CM | POA: Diagnosis not present

## 2019-03-24 DIAGNOSIS — C3411 Malignant neoplasm of upper lobe, right bronchus or lung: Secondary | ICD-10-CM | POA: Diagnosis not present

## 2019-03-24 DIAGNOSIS — B370 Candidal stomatitis: Secondary | ICD-10-CM | POA: Diagnosis not present

## 2019-03-24 DIAGNOSIS — E860 Dehydration: Secondary | ICD-10-CM

## 2019-03-24 DIAGNOSIS — D61818 Other pancytopenia: Secondary | ICD-10-CM | POA: Diagnosis not present

## 2019-03-24 DIAGNOSIS — R519 Headache, unspecified: Secondary | ICD-10-CM | POA: Diagnosis not present

## 2019-03-24 DIAGNOSIS — Z5111 Encounter for antineoplastic chemotherapy: Secondary | ICD-10-CM | POA: Diagnosis present

## 2019-03-24 DIAGNOSIS — Z79899 Other long term (current) drug therapy: Secondary | ICD-10-CM | POA: Diagnosis not present

## 2019-03-24 LAB — CBC WITH DIFFERENTIAL (CANCER CENTER ONLY)
Abs Immature Granulocytes: 0.03 10*3/uL (ref 0.00–0.07)
Basophils Absolute: 0.1 10*3/uL (ref 0.0–0.1)
Basophils Relative: 2 %
Eosinophils Absolute: 0 10*3/uL (ref 0.0–0.5)
Eosinophils Relative: 0 %
HCT: 35.1 % — ABNORMAL LOW (ref 36.0–46.0)
Hemoglobin: 11.7 g/dL — ABNORMAL LOW (ref 12.0–15.0)
Immature Granulocytes: 1 %
Lymphocytes Relative: 28 %
Lymphs Abs: 0.9 10*3/uL (ref 0.7–4.0)
MCH: 28.9 pg (ref 26.0–34.0)
MCHC: 33.3 g/dL (ref 30.0–36.0)
MCV: 86.7 fL (ref 80.0–100.0)
Monocytes Absolute: 0.5 10*3/uL (ref 0.1–1.0)
Monocytes Relative: 15 %
Neutro Abs: 1.7 10*3/uL (ref 1.7–7.7)
Neutrophils Relative %: 54 %
Platelet Count: 280 10*3/uL (ref 150–400)
RBC: 4.05 MIL/uL (ref 3.87–5.11)
RDW: 15 % (ref 11.5–15.5)
WBC Count: 3.1 10*3/uL — ABNORMAL LOW (ref 4.0–10.5)
nRBC: 0 % (ref 0.0–0.2)

## 2019-03-24 LAB — CMP (CANCER CENTER ONLY)
ALT: 16 U/L (ref 0–44)
AST: 17 U/L (ref 15–41)
Albumin: 4 g/dL (ref 3.5–5.0)
Alkaline Phosphatase: 101 U/L (ref 38–126)
Anion gap: 10 (ref 5–15)
BUN: 14 mg/dL (ref 6–20)
CO2: 24 mmol/L (ref 22–32)
Calcium: 9.2 mg/dL (ref 8.9–10.3)
Chloride: 105 mmol/L (ref 98–111)
Creatinine: 0.82 mg/dL (ref 0.44–1.00)
GFR, Est AFR Am: >60 mL/min (ref >60)
GFR, Estimated: >60 mL/min (ref >60)
Glucose, Bld: 131 mg/dL — ABNORMAL HIGH (ref 70–99)
Potassium: 3.7 mmol/L (ref 3.5–5.1)
Sodium: 139 mmol/L (ref 135–145)
Total Bilirubin: 0.3 mg/dL (ref 0.3–1.2)
Total Protein: 7.7 g/dL (ref 6.5–8.1)

## 2019-03-24 MED ORDER — PALONOSETRON HCL INJECTION 0.25 MG/5ML
INTRAVENOUS | Status: AC
  Filled 2019-03-24: qty 5

## 2019-03-24 MED ORDER — HOT PACK MISC ONCOLOGY
1.0000 | Freq: Once | Status: DC | PRN
  Filled 2019-03-24: qty 1

## 2019-03-24 MED ORDER — SODIUM CHLORIDE 0.9 % IV SOLN
Freq: Once | INTRAVENOUS | Status: AC
  Filled 2019-03-24: qty 250

## 2019-03-24 MED ORDER — PALONOSETRON HCL INJECTION 0.25 MG/5ML
0.2500 mg | Freq: Once | INTRAVENOUS | Status: AC
  Administered 2019-03-24: 0.25 mg via INTRAVENOUS

## 2019-03-24 MED ORDER — SODIUM CHLORIDE 0.9 % IV SOLN
100.0000 mg/m2 | Freq: Once | INTRAVENOUS | Status: AC
  Administered 2019-03-24: 170 mg via INTRAVENOUS
  Filled 2019-03-24: qty 8.5

## 2019-03-24 MED ORDER — MORPHINE SULFATE ER 15 MG PO TBCR: 15.0000 mg | EXTENDED_RELEASE_TABLET | Freq: Two times a day (BID) | ORAL | 0 refills | Status: DC

## 2019-03-24 MED ORDER — SODIUM CHLORIDE 0.9 % IV SOLN
75.0000 mg/m2 | Freq: Once | INTRAVENOUS | Status: AC
  Administered 2019-03-24: 125 mg via INTRAVENOUS
  Filled 2019-03-24: qty 50

## 2019-03-24 MED ORDER — SODIUM CHLORIDE 0.9% FLUSH
10.0000 mL | INTRAVENOUS | Status: DC | PRN
  Administered 2019-03-24: 10 mL
  Filled 2019-03-24: qty 10

## 2019-03-24 MED ORDER — HEPARIN SOD (PORK) LOCK FLUSH 100 UNIT/ML IV SOLN
250.0000 [IU] | Freq: Once | INTRAVENOUS | Status: DC | PRN
  Filled 2019-03-24: qty 5

## 2019-03-24 MED ORDER — LORAZEPAM 0.5 MG PO TABS: 0.5000 mg | ORAL_TABLET | Freq: Four times a day (QID) | ORAL | 0 refills | Status: DC | PRN

## 2019-03-24 MED ORDER — SODIUM CHLORIDE 0.9 % IV SOLN
Freq: Once | INTRAVENOUS | Status: DC
  Filled 2019-03-24: qty 250

## 2019-03-24 MED ORDER — ALTEPLASE 2 MG IJ SOLR
2.0000 mg | Freq: Once | INTRAMUSCULAR | Status: DC | PRN
  Filled 2019-03-24: qty 2

## 2019-03-24 MED ORDER — HEPARIN SOD (PORK) LOCK FLUSH 100 UNIT/ML IV SOLN
500.0000 [IU] | Freq: Once | INTRAVENOUS | Status: AC | PRN
  Administered 2019-03-24: 500 [IU]
  Filled 2019-03-24: qty 5

## 2019-03-24 MED ORDER — SODIUM CHLORIDE 0.9% FLUSH
10.0000 mL | Freq: Once | INTRAVENOUS | Status: DC | PRN
  Filled 2019-03-24: qty 10

## 2019-03-24 MED ORDER — POTASSIUM CHLORIDE 2 MEQ/ML IV SOLN
Freq: Once | INTRAVENOUS | Status: AC
  Filled 2019-03-24: qty 10

## 2019-03-24 MED ORDER — HEPARIN SOD (PORK) LOCK FLUSH 100 UNIT/ML IV SOLN
500.0000 [IU] | Freq: Once | INTRAVENOUS | Status: DC | PRN
  Filled 2019-03-24: qty 5

## 2019-03-24 MED ORDER — SODIUM CHLORIDE 0.9 % IV SOLN
Freq: Once | INTRAVENOUS | Status: AC
  Filled 2019-03-24: qty 5

## 2019-03-24 MED ORDER — SODIUM CHLORIDE 0.9% FLUSH
3.0000 mL | INTRAVENOUS | Status: DC | PRN
  Filled 2019-03-24: qty 10

## 2019-03-24 MED FILL — LORazepam 0.5 MG TABS: 0.5 | 8 days supply | Qty: 30 | Fill #0

## 2019-03-24 MED FILL — MORPHINE SULF ER 15 MG TAB: 15 | 30 days supply | Qty: 60 | Fill #0

## 2019-03-24 NOTE — Telephone Encounter (Signed)
Appointments scheduled calendar printed per 12/15 los

## 2019-03-24 NOTE — Patient Instructions (Signed)
[facility] Discharge Instructions for Patients Receiving Chemotherapy  Today you received the following chemotherapy agents:  Cisplatin and Etoposide  To help prevent nausea and vomiting after your treatment, we encourage you to take your nausea medication as ordered per MD.    If you develop nausea and vomiting that is not controlled by your nausea medication, call the clinic.   BELOW ARE SYMPTOMS THAT SHOULD BE REPORTED IMMEDIATELY:  *FEVER GREATER THAN 100.5 F  *CHILLS WITH OR WITHOUT FEVER  NAUSEA AND VOMITING THAT IS NOT CONTROLLED WITH YOUR NAUSEA MEDICATION  *UNUSUAL SHORTNESS OF BREATH  *UNUSUAL BRUISING OR BLEEDING  TENDERNESS IN MOUTH AND THROAT WITH OR WITHOUT PRESENCE OF ULCERS  *URINARY PROBLEMS  *BOWEL PROBLEMS  UNUSUAL RASH Items with * indicate a potential emergency and should be followed up as soon as possible.  Feel free to call the clinic should you have any questions or concerns. The clinic phone number is [phone].  Please show the [facility] at check-in to the Emergency Department and triage nurse.

## 2019-03-24 NOTE — Progress Notes (Signed)
Patient here to start cycle two of her treatment. This cycle delayed due to hospitalization. She states she's feeling better and ready for her next treatment. She is having difficulty with finding help for her Eliquis. She is taking daily nutritional supplements to help her dietary intake.  Message sent to Otilio Carpen to assess for possible medication assistance with her Eliquis.  Bags of Boost and Ensure samples given to patient to help with financial burden.  She knows she can reach out at any time for assistance.

## 2019-03-24 NOTE — Progress Notes (Signed)
UOP on arrival to cancer center, 120 mL since.  Okay to treat per MD.

## 2019-03-24 NOTE — Progress Notes (Signed)
[place] OFFICE PROGRESS NOTE  Patient Care Team: Patient, No Pcp Per as PCP - General (General Practice) [name], MD as Medical Oncologist (Oncology) Cordelia Poche, RN as Oncology Nurse Navigator  HEME/ONC OVERVIEW: 1. Limited stage small cell lung cancer, Stage IIIA ([id number]) -01/2019:   Large RUL mass w/ thoracic adenopathy and a second mass/consolidation in the RUL periphery on CTA chest  FDG-avid RUL mass with suspected mediastinal invasion, R paratracheal adenopathy; indeterminate consolidation in R apex (post-obstructive pneumonitis vs. satellite lesion in the same lobe)  MRI brain negative for malignancy -02/2019: bronchoscopy w/ biopsy of the RUL mass and 7R LN, path showed small cell lung cancer  -02/2019 - present: concurrent chemoRT with cisplatin/etoposide   2. 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 Port in 02/2019   TREATMENT REGIMEN:  02/24/2019 - present: concurrent chemoradiation with cisplatin/etoposide, plan for 4 cycles; RT with Cycle 2 of chemotherapy (started on 03/17/2019)  ASSESSMENT & PLAN:   Limited stage small cell lung cancer, Stage IIIA ([id number]) -S/p one cycle of cisplatin/etoposide; treatment complicated by severe pancytopenia and neutropenic fever  -I discussed the case in detail with Dr. Malva Limes of radiation oncology at [facility]; radiation was started on 03/17/2019 while awaiting cytopenias to improve -While G-CSF is generally avoided in concurrent chemoradiation, in light of recent severe pancytopenia and neutropenic fever, the benefits of G-CSF likely outweigh the risks -Therefore, I have added Udenyca, starting with Cycle 2 of chemotherapy  -Labs adequate today, proceed with Cycle 2 of chemotherapy  -As the patient is responding to treatment, and the goal is for curative intent, we will avoid reducing the chemotherapy dosage unless she develops recurrent severe cytopenias despite  G-CSF support   -In the event that she develops recurrent severe cytopenias, then we will need to reduce her chemotherapy dose (likely etoposide first) -PRN anti-emetics: Zofran, Compazine, dexamethasone and Ativan   Chemotherapy-associated leukopenia -Secondary to chemotherapy -WBC 3.1k with ANC ~1700, stable since discharge  -Patient denies any symptoms of infection -We will monitor for now; no indication for dose adjustment -If leukopenia worsens in the future, we will consider delaying chemotherapy or adjusting chemotherapy dose  Chemotherapy-associated anemia -Secondary to chemotherapy -Hgb 11.7, stable  -Patient denies any symptom of bleeding -We will monitor for now; no indication for dose adjustment  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 -Diagnosed during admission in 03/2019 -IR recommended against removing the port, and patient was started on Eliquis -Currently tolerating Eliquis well without abnormal bleeding or bruising -Continue Eliquis 5mg  BID  -Due to the high co-pay, I have asked the financial counselor to assist the patient with applications for medication assistance   Cancer-related pain -Likely due to pleural invasion by one of the RUL lung masses -Currently on MS-Contin 15mg  BID and Norco 7.5/325 q8hrs PRN; excess sedation with IR morphine -Pain reasonably controlled -As small cell lung cancer tends to be chemotherapy-sensitive, she may need less pain medication once she starts treatment -We will continue to adjust her pain regimen as needed   Headache -Possibly due to tension headache -MRI brain in 03/2019 unremarkable; neurology consult while inpatient did not recommend further work-up -Continue PRN Fioricet for now   No orders of the defined types were placed in this encounter.  All questions were answered. The patient knows to call the clinic with any problems, questions or concerns. No barriers to learning was detected.  Return in 2 weeks for lab check  prior to Cycle 3 of chemotherapy.   [name], MD 03/24/2019 11:41 AM  CHIEF COMPLAINT: "I am doing better"  INTERVAL HISTORY: Marcia King returns to the clinic for follow-up of limited stage small cell lung cancer on definitive chemoradiation.  Patient developed severe pancytopenia and neutropenic fever after the 1st cycle of chemotherapy, and was hospitalized at [facility], where she received empiric IV abx.  In addition, she had persistent headache, for which MRI brain was done and did not show any abnormality.  Neurology was consulted and did not recommend any further work-up.  She reports that her headache has been improving over the weekend, and she has not had to take any Fioricet since then.  She is tolerating Eliquis well without any abnormal bleeding or bruising.  She has about 3 weeks of Eliquis supply remaining.   REVIEW OF SYSTEMS:   Constitutional: ( - ) fevers, ( - )  chills , ( - ) night sweats Eyes: ( - ) blurriness of vision, ( - ) double vision, ( - ) watery eyes Ears, nose, mouth, throat, and face: ( - ) mucositis, ( - ) sore throat Respiratory: ( - ) cough, ( - ) dyspnea, ( - ) wheezes Cardiovascular: ( - ) palpitation, ( - ) chest discomfort, ( - ) lower extremity swelling Gastrointestinal:  ( - ) nausea, ( - ) heartburn, ( - ) change in bowel habits Skin: ( - ) abnormal skin rashes Lymphatics: ( - ) new lymphadenopathy, ( - ) easy bruising Neurological: ( - ) numbness, ( - ) tingling, ( - ) new weaknesses Behavioral/Psych: ( - ) mood change, ( - ) new changes  All other systems were reviewed with the patient and are negative.  SUMMARY OF ONCOLOGIC HISTORY: Oncology History  Small cell lung cancer ([place])  01/28/2019 Initial Diagnosis   Lung cancer ([place])   01/28/2019 Imaging   CTA chest: IMPRESSION: 1. No demonstrable pulmonary embolus. No thoracic aortic aneurysm or dissection.   2. Mass arising in the right upper lobe extending into the  right hilum measuring 3.1 x 2.5 cm. Questionable second mass in the right upper lobe more peripherally and anteriorly measuring 2.4 x 1.7 cm versus consolidation in this area. There is adenopathy in the right hilum and right paratracheal regions. Neoplastic involvement is felt to be present. It may be prudent to correlate with nuclear medicine PET study to further characterize.   3. Underlying emphysematous change with areas of fibrosis throughout the periphery of each lung. Fibrosis is somewhat evenly distributed between upper and lower lobe regions. There is atelectatic change in the lung bases, more on the right than on the left.   Emphysema (ICD10-J43.9).   02/06/2019 Imaging   PET:   IMPRESSION: Right upper lobe perihilar lung mass is intensely hypermetabolic compatible with primary bronchogenic carcinoma. Suspect mediastinal invasion with hypermetabolic right paratracheal adenopathy. Assuming a non-small cell neoplasm this is favored to represent a T4N1M0 lesion or stage IIIa disease.   Indeterminate nodular area of subpleural consolidation in with thin the lateral right apex has an SUV max of 4.96. This may represent an area of postobstructive pneumonitis versus satellite lesion in same lobe.   Aortic Atherosclerosis (ICD10-I70.0) and Emphysema (ICD10-J43.9).   02/07/2019 Imaging   MRI brain: IMPRESSION: 1. Normal MRI appearance the brain. No evidence for metastatic disease to the brain. 2. Left mastoid effusion without obstructing nasopharyngeal lesion.   02/12/2019 Pathology Results   Specimen Submitted:  A.  LUNG, RIGHT UPPER LOBE, LAVAGE:    FINAL MICROSCOPIC DIAGNOSIS:  - Atypical cells present  - See comment   SPECIMEN ADEQUACY:  Satisfactory for evaluation   DIAGNOSTIC COMMENTS:  Mixed acute and chronic inflammation is present.  There are rare atypical cells present on the heme slides only.  These  are not seen on the cytology smears.    02/12/2019  Pathology Results   FINAL MICROSCOPIC DIAGNOSIS:   A.   MASS, RIGHT HILAR, FINE NEEDLE ASPIRATION:  -  Malignant cells consistent with small cell carcinoma  -  See comment   B. LYMPH NODE, 7 NODE, FINE NEEDLE ASPIRATION:  -  No malignant cells identified   COMMENT:   A.  Immunohistochemistry performed on the cell block shows the  neoplastic cells are positive for TTF-1, CD56, and synaptophysin but  negative for chromogranin, cytokeratin 5 6 and p40.  Overall, the  combined morphology and immunophenotype are consistent with small cell  carcinoma.    02/18/2019 Cancer Staging   Staging form: Lung, AJCC 8th Edition - Clinical: Stage IIIA (cT4, cN1, cM0) - Signed by [name], MD on 02/18/2019   02/24/2019 -  Chemotherapy   The patient had palonosetron (ALOXI) injection 0.25 mg, 0.25 mg, Intravenous,  Once, 2 of 4 cycles Administration: 0.25 mg (02/24/2019) pegfilgrastim-cbqv (UDENYCA) injection 6 mg, 6 mg, Subcutaneous, Once, 1 of 3 cycles CISplatin (PLATINOL) 125 mg in sodium chloride 0.9 % 500 mL chemo infusion, 75 mg/m2 = 125 mg (100 % of original dose 75 mg/m2), Intravenous,  Once, 2 of 4 cycles Dose modification: 75 mg/m2 (original dose 75 mg/m2, Cycle 1, Reason: Provider Judgment) Administration: 125 mg (02/24/2019) etoposide (VEPESID) 170 mg in sodium chloride 0.9 % 500 mL chemo infusion, 100 mg/m2 = 170 mg, Intravenous,  Once, 2 of 4 cycles Dose modification: 80 mg/m2 (original dose 100 mg/m2, Cycle 2, Reason: Dose not tolerated), 100 mg/m2 (original dose 100 mg/m2, Cycle 2, Reason: Provider Judgment) Administration: 170 mg (02/24/2019), 170 mg (02/25/2019), 170 mg (02/26/2019) fosaprepitant (EMEND) 150 mg, dexamethasone (DECADRON) 12 mg in sodium chloride 0.9 % 145 mL IVPB, , Intravenous,  Once, 2 of 4 cycles Administration:  (02/24/2019)  for chemotherapy treatment.      I have reviewed the past medical history, past surgical history, social history and family history  with the patient and they are unchanged from previous note.  ALLERGIES:  has No Known Allergies.  MEDICATIONS:  Current Outpatient Medications  Medication Sig Dispense Refill  . albuterol (PROVENTIL HFA;VENTOLIN HFA) 108 (90 Base) MCG/ACT inhaler Inhale 1-2 puffs into the lungs every 4 (four) hours as needed for wheezing or shortness of breath.     Marland Kitchen apixaban (ELIQUIS) 5 MG TABS tablet Take 5 mg by mouth 2 (two) times daily.    . calcium-vitamin D (OSCAL WITH D) 500-200 MG-UNIT TABS tablet Take 1 tablet by mouth every morning.    Marland Kitchen dexamethasone (DECADRON) 4 MG tablet Take 2 tablets two times a day for 1 day on day 4 after cisplatin chemotherapy. Take with food. 30 tablet 1  . diphenoxylate-atropine (LOMOTIL) 2.5-0.025 MG tablet Take 2 tablets by mouth 4 (four) times daily as needed for diarrhea or loose stools. 30 tablet 0  . HYDROcodone-acetaminophen (NORCO) 7.5-325 MG tablet Take 1 tablet by mouth every 8 (eight) hours as needed for moderate pain. 60 tablet 0  . ibuprofen (ADVIL) 800 MG tablet Take 1 tablet (800 mg total) by mouth 3 (three) times daily. (Patient taking differently: Take  800 mg by mouth every 8 (eight) hours as needed (pain). ) 21 tablet 0  . lidocaine-prilocaine (EMLA) cream Apply to affected area once 30 g 3  . LORazepam (ATIVAN) 0.5 MG tablet Take 1 tablet (0.5 mg total) by mouth every 6 (six) hours as needed (Nausea or vomiting). 30 tablet 0  . morphine (MS CONTIN) 15 MG 12 hr tablet Take 1 tablet (15 mg total) by mouth every 12 (twelve) hours. 60 tablet 0  . ondansetron (ZOFRAN) 8 MG tablet Take 1 tablet (8 mg total) by mouth 2 (two) times daily as needed. Start on the third day after cisplatin chemotherapy. 30 tablet 1  . prochlorperazine (COMPAZINE) 10 MG tablet Take 1 tablet (10 mg total) by mouth every 6 (six) hours as needed (Nausea or vomiting). 30 tablet 1  . aspirin EC 325 MG tablet Take by mouth.     No current facility-administered medications for this visit.    Facility-Administered Medications Ordered in Other Visits  Medication Dose Route Frequency Provider Last Rate Last Admin  . 0.9 %  sodium chloride infusion   Intravenous Once [place], Holli Humbles, NP      . alteplase (CATHFLO ACTIVASE) injection 2 mg  2 mg Intracatheter Once PRN [name], MD      . CISplatin (PLATINOL) 125 mg in sodium chloride 0.9 % 500 mL chemo infusion  75 mg/m2 (Treatment Plan Recorded) Intravenous Once [name], MD      . etoposide (VEPESID) 170 mg in sodium chloride 0.9 % 500 mL chemo infusion  100 mg/m2 (Treatment Plan Recorded) Intravenous Once [name], MD      . heparin lock flush 100 unit/mL  500 Units Intracatheter Once PRN [name], MD      . heparin lock flush 100 unit/mL  250 Units Intracatheter Once PRN [name], MD      . heparin lock flush 100 unit/mL  500 Units Intracatheter Once PRN [place], Holli Humbles, NP      . Hot Pack 1 packet  1 packet Topical Once PRN [name], MD      . sodium chloride flush (NS) 0.9 % injection 10 mL  10 mL Intracatheter PRN [name], MD      . sodium chloride flush (NS) 0.9 % injection 10 mL  10 mL Intracatheter Once PRN [place], Holli Humbles, NP      . sodium chloride flush (NS) 0.9 % injection 3 mL  3 mL Intracatheter PRN [name], MD        PHYSICAL EXAMINATION: ECOG PERFORMANCE STATUS: 1 - Symptomatic but completely ambulatory  Today's Vitals   03/24/19 0840 03/24/19 0848  BP: (!) 140/101 (!) 142/98  Pulse: (!) 112 (!) 108  Temp: 97.8 F (36.6 C)   TempSrc: Oral   SpO2: 97%   Weight: 139 lb 12.8 oz (63.4 kg)   PainSc: 2     Body mass index is 27.3 kg/m.  Filed Weights   03/24/19 0840  Weight: 139 lb 12.8 oz (63.4 kg)    GENERAL: alert, no distress and comfortable, walks with a cane  SKIN: skin color, texture, turgor are normal, no rashes or significant lesions EYES: conjunctiva are pink and non-injected, sclera clear OROPHARYNX: no exudate, no erythema; lips, buccal mucosa, and tongue normal  NECK:  supple, non-tender LUNGS: clear to auscultation with normal breathing effort  HEART: regular rate & rhythm and no murmurs and no lower extremity edema ABDOMEN: soft, non-tender, non-distended, normal bowel sounds Musculoskeletal: no cyanosis  of digits and no clubbing  PSYCH: alert & oriented x 3, fluent speech  LABORATORY DATA:  I have reviewed the data as listed    Component Value Date/Time   NA 139 03/24/2019 0757   K 3.7 03/24/2019 0757   CL 105 03/24/2019 0757   CO2 24 03/24/2019 0757   GLUCOSE 131 (H) 03/24/2019 0757   BUN 14 03/24/2019 0757   CREATININE 0.82 03/24/2019 0757   CALCIUM 9.2 03/24/2019 0757   PROT 7.7 03/24/2019 0757   ALBUMIN 4.0 03/24/2019 0757   AST 17 03/24/2019 0757   ALT 16 03/24/2019 0757   ALKPHOS 101 03/24/2019 0757   BILITOT 0.3 03/24/2019 0757   GFRNONAA >60 03/24/2019 0757   GFRAA >60 03/24/2019 0757    No results found for: SPEP, UPEP  Lab Results  Component Value Date   WBC 3.1 (L) 03/24/2019   NEUTROABS 1.7 03/24/2019   HGB 11.7 (L) 03/24/2019   HCT 35.1 (L) 03/24/2019   MCV 86.7 03/24/2019   PLT 280 03/24/2019      Chemistry      Component Value Date/Time   NA 139 03/24/2019 0757   K 3.7 03/24/2019 0757   CL 105 03/24/2019 0757   CO2 24 03/24/2019 0757   BUN 14 03/24/2019 0757   CREATININE 0.82 03/24/2019 0757      Component Value Date/Time   CALCIUM 9.2 03/24/2019 0757   ALKPHOS 101 03/24/2019 0757   AST 17 03/24/2019 0757   ALT 16 03/24/2019 0757   BILITOT 0.3 03/24/2019 0757       RADIOGRAPHIC STUDIES: I have personally reviewed the radiological images as listed below and agreed with the findings in the report. No results found.

## 2019-03-24 NOTE — Progress Notes (Signed)
OK to treat with 101 HR per Dr. Maylon Peppers.

## 2019-03-24 NOTE — Telephone Encounter (Signed)
Sent [facility] [place] appl via inter-ofc mail to: Attn: Johnna Acosta [facility] [place]

## 2019-03-25 ENCOUNTER — Other Ambulatory Visit: Payer: Self-pay

## 2019-03-25 ENCOUNTER — Inpatient Hospital Stay: Payer: Medicaid Other

## 2019-03-25 VITALS — BP 150/99 | HR 99 | Temp 98.6°F | Resp 19

## 2019-03-25 DIAGNOSIS — C3490 Malignant neoplasm of unspecified part of unspecified bronchus or lung: Secondary | ICD-10-CM

## 2019-03-25 DIAGNOSIS — E860 Dehydration: Secondary | ICD-10-CM

## 2019-03-25 DIAGNOSIS — C3411 Malignant neoplasm of upper lobe, right bronchus or lung: Secondary | ICD-10-CM | POA: Diagnosis not present

## 2019-03-25 MED ORDER — SODIUM CHLORIDE 0.9 % IV SOLN
Freq: Once | INTRAVENOUS | Status: AC
  Filled 2019-03-25: qty 250

## 2019-03-25 MED ORDER — DEXAMETHASONE SODIUM PHOSPHATE 10 MG/ML IJ SOLN
10.0000 mg | Freq: Once | INTRAMUSCULAR | Status: AC
  Administered 2019-03-25: 10 mg via INTRAVENOUS

## 2019-03-25 MED ORDER — SODIUM CHLORIDE 0.9 % IV SOLN
Freq: Once | INTRAVENOUS | Status: DC
  Filled 2019-03-25: qty 250

## 2019-03-25 MED ORDER — HEPARIN SOD (PORK) LOCK FLUSH 100 UNIT/ML IV SOLN
500.0000 [IU] | Freq: Once | INTRAVENOUS | Status: AC | PRN
  Administered 2019-03-25: 500 [IU]
  Filled 2019-03-25: qty 5

## 2019-03-25 MED ORDER — SODIUM CHLORIDE 0.9% FLUSH
10.0000 mL | INTRAVENOUS | Status: DC | PRN
  Administered 2019-03-25: 14:00:00 10 mL
  Filled 2019-03-25: qty 10

## 2019-03-25 MED ORDER — DEXAMETHASONE SODIUM PHOSPHATE 10 MG/ML IJ SOLN
INTRAMUSCULAR | Status: AC
  Filled 2019-03-25: qty 1

## 2019-03-25 MED ORDER — SODIUM CHLORIDE 0.9 % IV SOLN
100.0000 mg/m2 | Freq: Once | INTRAVENOUS | Status: AC
  Administered 2019-03-25: 13:00:00 170 mg via INTRAVENOUS
  Filled 2019-03-25: qty 8.5

## 2019-03-25 NOTE — Patient Instructions (Signed)
Etoposide, VP-16 injection What is this medicine? ETOPOSIDE, VP-16 (e toe POE side) is a chemotherapy drug. It is used to treat testicular cancer, lung cancer, and other cancers. This medicine may be used for other purposes; ask your health care provider or pharmacist if you have questions. COMMON BRAND NAME(S): Etopophos, Toposar, VePesid What should I tell my health care provider before I take this medicine? They need to know if you have any of these conditions:  infection  kidney disease  liver disease  low blood counts, like low white cell, platelet, or red cell counts  an unusual or allergic reaction to etoposide, other medicines, foods, dyes, or preservatives  pregnant or trying to get pregnant  breast-feeding How should I use this medicine? This medicine is for infusion into a vein. It is administered in a hospital or clinic by a specially trained health care professional. Talk to your pediatrician regarding the use of this medicine in children. Special care may be needed. Overdosage: If you think you have taken too much of this medicine contact a poison control center or emergency room at once. NOTE: This medicine is only for you. Do not share this medicine with others. What if I miss a dose? It is important not to miss your dose. Call your doctor or health care professional if you are unable to keep an appointment. What may interact with this medicine?  aspirin  certain medications for seizures like carbamazepine, phenobarbital, phenytoin, valproic acid  cyclosporine  levamisole  warfarin This list may not describe all possible interactions. Give your health care provider a list of all the medicines, herbs, non-prescription drugs, or dietary supplements you use. Also tell them if you smoke, drink alcohol, or use illegal drugs. Some items may interact with your medicine. What should I watch for while using this medicine? Visit your doctor for checks on your progress.  This drug may make you feel generally unwell. This is not uncommon, as chemotherapy can affect healthy cells as well as cancer cells. Report any side effects. Continue your course of treatment even though you feel ill unless your doctor tells you to stop. In some cases, you may be given additional medicines to help with side effects. Follow all directions for their use. Call your doctor or health care professional for advice if you get a fever, chills or sore throat, or other symptoms of a cold or flu. Do not treat yourself. This drug decreases your body's ability to fight infections. Try to avoid being around people who are sick. This medicine may increase your risk to bruise or bleed. Call your doctor or health care professional if you notice any unusual bleeding. Talk to your doctor about your risk of cancer. You may be more at risk for certain types of cancers if you take this medicine. Do not become pregnant while taking this medicine or for at least 6 months after stopping it. Women should inform their doctor if they wish to become pregnant or think they might be pregnant. Women of child-bearing potential will need to have a negative pregnancy test before starting this medicine. There is a potential for serious side effects to an unborn child. Talk to your health care professional or pharmacist for more information. Do not breast-feed an infant while taking this medicine. Men must use a latex condom during sexual contact with a woman while taking this medicine and for at least 4 months after stopping it. A latex condom is needed even if you have had a  vasectomy. Contact your doctor right away if your partner becomes pregnant. Do not donate sperm while taking this medicine and for at least 4 months after you stop taking this medicine. Men should inform their doctors if they wish to father a child. This medicine may lower sperm counts. What side effects may I notice from receiving this medicine? Side  effects that you should report to your doctor or health care professional as soon as possible:  allergic reactions like skin rash, itching or hives, swelling of the face, lips, or tongue  low blood counts - this medicine may decrease the number of white blood cells, red blood cells and platelets. You may be at increased risk for infections and bleeding.  signs of infection - fever or chills, cough, sore throat, pain or difficulty passing urine  signs of decreased platelets or bleeding - bruising, pinpoint red spots on the skin, black, tarry stools, blood in the urine  signs of decreased red blood cells - unusually weak or tired, fainting spells, lightheadedness  breathing problems  changes in vision  mouth or throat sores or ulcers  pain, redness, swelling or irritation at the injection site  pain, tingling, numbness in the hands or feet  redness, blistering, peeling or loosening of the skin, including inside the mouth  seizures  vomiting Side effects that usually do not require medical attention (report to your doctor or health care professional if they continue or are bothersome):  diarrhea  hair loss  loss of appetite  nausea  stomach pain This list may not describe all possible side effects. Call your doctor for medical advice about side effects. You may report side effects to FDA at 1-800-FDA-1088. Where should I keep my medicine? This drug is given in a hospital or clinic and will not be stored at home. NOTE: This sheet is a summary. It may not cover all possible information. If you have questions about this medicine, talk to your doctor, pharmacist, or health care provider.  2020 [employer]vier/Gold Standard (2015-03-18 11:53:23)

## 2019-03-26 ENCOUNTER — Inpatient Hospital Stay: Payer: Medicaid Other

## 2019-03-26 ENCOUNTER — Other Ambulatory Visit: Payer: Self-pay | Admitting: *Deleted

## 2019-03-26 VITALS — BP 158/102 | HR 85 | Temp 97.8°F

## 2019-03-26 DIAGNOSIS — C3411 Malignant neoplasm of upper lobe, right bronchus or lung: Secondary | ICD-10-CM | POA: Diagnosis not present

## 2019-03-26 DIAGNOSIS — C3490 Malignant neoplasm of unspecified part of unspecified bronchus or lung: Secondary | ICD-10-CM

## 2019-03-26 MED ORDER — SODIUM CHLORIDE 0.9% FLUSH
10.0000 mL | INTRAVENOUS | Status: DC | PRN
  Filled 2019-03-26: qty 10

## 2019-03-26 MED ORDER — DEXAMETHASONE SODIUM PHOSPHATE 10 MG/ML IJ SOLN
10.0000 mg | Freq: Once | INTRAMUSCULAR | Status: AC
  Administered 2019-03-26: 13:00:00 10 mg via INTRAVENOUS

## 2019-03-26 MED ORDER — DEXAMETHASONE SODIUM PHOSPHATE 10 MG/ML IJ SOLN
INTRAMUSCULAR | Status: AC
  Filled 2019-03-26: qty 1

## 2019-03-26 MED ORDER — SODIUM CHLORIDE 0.9 % IV SOLN
Freq: Once | INTRAVENOUS | Status: AC
  Filled 2019-03-26: qty 250

## 2019-03-26 MED ORDER — HEPARIN SOD (PORK) LOCK FLUSH 100 UNIT/ML IV SOLN
500.0000 [IU] | Freq: Once | INTRAVENOUS | Status: DC | PRN
  Filled 2019-03-26: qty 5

## 2019-03-26 MED ORDER — APIXABAN 5 MG PO TABS: 5.0000 mg | ORAL_TABLET | Freq: Two times a day (BID) | ORAL | 11 refills | Status: AC

## 2019-03-26 MED ORDER — SODIUM CHLORIDE 0.9 % IV SOLN
100.0000 mg/m2 | Freq: Once | INTRAVENOUS | Status: AC
  Administered 2019-03-26: 170 mg via INTRAVENOUS
  Filled 2019-03-26: qty 8.5

## 2019-03-26 NOTE — Progress Notes (Signed)
BP reviewed with MD, ok to proceed with BP 158/102.

## 2019-03-27 ENCOUNTER — Other Ambulatory Visit: Payer: Self-pay

## 2019-03-27 ENCOUNTER — Inpatient Hospital Stay: Payer: Medicaid Other

## 2019-03-27 VITALS — BP 168/101 | HR 100 | Resp 19 | Wt 140.0 lb

## 2019-03-27 DIAGNOSIS — C3411 Malignant neoplasm of upper lobe, right bronchus or lung: Secondary | ICD-10-CM | POA: Diagnosis not present

## 2019-03-27 DIAGNOSIS — C3490 Malignant neoplasm of unspecified part of unspecified bronchus or lung: Secondary | ICD-10-CM

## 2019-03-27 MED ORDER — PEGFILGRASTIM-CBQV 6 MG/0.6ML ~~LOC~~ SOSY
6.0000 mg | PREFILLED_SYRINGE | Freq: Once | SUBCUTANEOUS | Status: AC
  Administered 2019-03-27: 15:00:00 6 mg via SUBCUTANEOUS

## 2019-03-27 MED ORDER — PEGFILGRASTIM-CBQV 6 MG/0.6ML ~~LOC~~ SOSY
PREFILLED_SYRINGE | SUBCUTANEOUS | Status: AC
  Filled 2019-03-27: qty 0.6

## 2019-03-27 NOTE — Patient Instructions (Signed)

## 2019-03-30 ENCOUNTER — Other Ambulatory Visit: Payer: Self-pay | Admitting: *Deleted

## 2019-03-30 ENCOUNTER — Encounter: Payer: Self-pay | Admitting: *Deleted

## 2019-03-30 DIAGNOSIS — K2100 Gastro-esophageal reflux disease with esophagitis, without bleeding: Secondary | ICD-10-CM

## 2019-03-30 DIAGNOSIS — B3781 Candidal esophagitis: Secondary | ICD-10-CM

## 2019-03-30 DIAGNOSIS — B370 Candidal stomatitis: Secondary | ICD-10-CM

## 2019-03-30 MED ORDER — FLUCONAZOLE 150 MG PO TABS: 150.0000 mg | ORAL_TABLET | Freq: Every day | ORAL | 0 refills | Status: DC

## 2019-03-30 MED ORDER — NYSTATIN 100000 UNIT/ML MT SUSP: 5.0000 mL | Freq: Four times a day (QID) | OROMUCOSAL | 2 refills | Status: AC

## 2019-03-30 MED ORDER — PANTOPRAZOLE SODIUM 40 MG PO TBEC: 40.0000 mg | DELAYED_RELEASE_TABLET | Freq: Every day | ORAL | 3 refills | Status: AC

## 2019-03-30 MED FILL — PANTOPRAZOLE SOD DR 40 MG T: 40 | 30 days supply | Qty: 30 | Fill #0

## 2019-03-30 MED FILL — FLUCONAZOLE 150 MG TABS: 150 | 7 days supply | Qty: 7 | Fill #0

## 2019-03-30 MED FILL — NYSTATIN 100,000 UNITS/ML S: 100000 | 3 days supply | Qty: 60 | Fill #0

## 2019-03-31 ENCOUNTER — Other Ambulatory Visit: Payer: Self-pay

## 2019-03-31 ENCOUNTER — Ambulatory Visit: Payer: Self-pay

## 2019-03-31 ENCOUNTER — Ambulatory Visit: Payer: Self-pay | Admitting: Family

## 2019-04-01 ENCOUNTER — Telehealth: Payer: Self-pay | Admitting: *Deleted

## 2019-04-01 ENCOUNTER — Other Ambulatory Visit: Payer: Self-pay | Admitting: Family

## 2019-04-01 NOTE — Telephone Encounter (Signed)
This RN called patient regarding a MyChart message. Patient stated,"I'm having a hard time swallowing and the pain in my chest is so painful I'm to the point of crying." I asked her if she was taking the long acting Morphine and Norco for breakthrough pain. She said yes. I instructed her to continue the medications that were called in on 03/30/2019. She stated, "I'm taking the Diflucan, Nystatin and Protonix as prescribed." I told her that Dr. Maylon Peppers was out of the office this week. Per Laverna Peace, NP, the pain is more than likely coming from the tumor and the location of the tumor. I instructed her that if she has increased shortness of breath, distress, increased chest pain, difficulty swallowing,any pain,numbness or tingling radiating down the arms, neck, and/or to the jaw please go to the closest ER to be assessed.This could be cardiac. Also, I changed your appointments from Tuesday, 04/07/19 to Monday, 04/06/19 for lab/port flush/MD visit. She verbalized understanding of the above conversation. All questions were answered.

## 2019-04-06 ENCOUNTER — Inpatient Hospital Stay: Payer: Medicaid Other

## 2019-04-06 ENCOUNTER — Telehealth: Payer: Self-pay | Admitting: *Deleted

## 2019-04-06 ENCOUNTER — Telehealth: Payer: Self-pay | Admitting: Hematology

## 2019-04-06 ENCOUNTER — Encounter: Payer: Self-pay | Admitting: Hematology

## 2019-04-06 ENCOUNTER — Other Ambulatory Visit: Payer: Self-pay

## 2019-04-06 ENCOUNTER — Telehealth: Payer: Self-pay | Admitting: Pharmacy Technician

## 2019-04-06 ENCOUNTER — Encounter: Payer: Self-pay | Admitting: *Deleted

## 2019-04-06 ENCOUNTER — Inpatient Hospital Stay (HOSPITAL_BASED_OUTPATIENT_CLINIC_OR_DEPARTMENT_OTHER): Payer: Medicaid Other | Admitting: Hematology

## 2019-04-06 VITALS — BP 136/88 | HR 95 | Temp 99.3°F | Resp 18 | Ht 60.0 in | Wt 141.4 lb

## 2019-04-06 DIAGNOSIS — R519 Headache, unspecified: Secondary | ICD-10-CM

## 2019-04-06 DIAGNOSIS — D61818 Other pancytopenia: Secondary | ICD-10-CM

## 2019-04-06 DIAGNOSIS — T451X5A Adverse effect of antineoplastic and immunosuppressive drugs, initial encounter: Secondary | ICD-10-CM

## 2019-04-06 DIAGNOSIS — Z7982 Long term (current) use of aspirin: Secondary | ICD-10-CM

## 2019-04-06 DIAGNOSIS — D6959 Other secondary thrombocytopenia: Secondary | ICD-10-CM

## 2019-04-06 DIAGNOSIS — G893 Neoplasm related pain (acute) (chronic): Secondary | ICD-10-CM

## 2019-04-06 DIAGNOSIS — K2100 Gastro-esophageal reflux disease with esophagitis, without bleeding: Secondary | ICD-10-CM

## 2019-04-06 DIAGNOSIS — D701 Agranulocytosis secondary to cancer chemotherapy: Secondary | ICD-10-CM

## 2019-04-06 DIAGNOSIS — C3490 Malignant neoplasm of unspecified part of unspecified bronchus or lung: Secondary | ICD-10-CM

## 2019-04-06 DIAGNOSIS — D6481 Anemia due to antineoplastic chemotherapy: Secondary | ICD-10-CM

## 2019-04-06 DIAGNOSIS — Z79899 Other long term (current) drug therapy: Secondary | ICD-10-CM

## 2019-04-06 DIAGNOSIS — Z7952 Long term (current) use of systemic steroids: Secondary | ICD-10-CM

## 2019-04-06 DIAGNOSIS — Z7901 Long term (current) use of anticoagulants: Secondary | ICD-10-CM

## 2019-04-06 DIAGNOSIS — C3411 Malignant neoplasm of upper lobe, right bronchus or lung: Secondary | ICD-10-CM

## 2019-04-06 DIAGNOSIS — Z791 Long term (current) use of non-steroidal anti-inflammatories (NSAID): Secondary | ICD-10-CM

## 2019-04-06 DIAGNOSIS — B370 Candidal stomatitis: Secondary | ICD-10-CM

## 2019-04-06 DIAGNOSIS — E860 Dehydration: Secondary | ICD-10-CM

## 2019-04-06 DIAGNOSIS — J439 Emphysema, unspecified: Secondary | ICD-10-CM

## 2019-04-06 LAB — CBC WITH DIFFERENTIAL (CANCER CENTER ONLY)
Abs Immature Granulocytes: 0.1 10*3/uL — ABNORMAL HIGH (ref 0.00–0.07)
Basophils Absolute: 0 10*3/uL (ref 0.0–0.1)
Basophils Relative: 1 %
Eosinophils Absolute: 0.4 10*3/uL (ref 0.0–0.5)
Eosinophils Relative: 13 %
HCT: 27.3 % — ABNORMAL LOW (ref 36.0–46.0)
Hemoglobin: 9.2 g/dL — ABNORMAL LOW (ref 12.0–15.0)
Immature Granulocytes: 4 %
Lymphocytes Relative: 13 %
Lymphs Abs: 0.4 10*3/uL — ABNORMAL LOW (ref 0.7–4.0)
MCH: 29.3 pg (ref 26.0–34.0)
MCHC: 33.7 g/dL (ref 30.0–36.0)
MCV: 86.9 fL (ref 80.0–100.0)
Monocytes Absolute: 0.3 10*3/uL (ref 0.1–1.0)
Monocytes Relative: 10 %
Neutro Abs: 1.7 10*3/uL (ref 1.7–7.7)
Neutrophils Relative %: 59 %
Platelet Count: 23 10*3/uL — ABNORMAL LOW (ref 150–400)
RBC: 3.14 MIL/uL — ABNORMAL LOW (ref 3.87–5.11)
RDW: 15.1 % (ref 11.5–15.5)
WBC Count: 2.8 10*3/uL — ABNORMAL LOW (ref 4.0–10.5)
nRBC: 0 % (ref 0.0–0.2)

## 2019-04-06 LAB — CMP (CANCER CENTER ONLY)
ALT: 14 U/L (ref 0–44)
AST: 12 U/L — ABNORMAL LOW (ref 15–41)
Albumin: 3.5 g/dL (ref 3.5–5.0)
Alkaline Phosphatase: 97 U/L (ref 38–126)
Anion gap: 7 (ref 5–15)
BUN: 15 mg/dL (ref 6–20)
CO2: 26 mmol/L (ref 22–32)
Calcium: 9 mg/dL (ref 8.9–10.3)
Chloride: 105 mmol/L (ref 98–111)
Creatinine: 0.83 mg/dL (ref 0.44–1.00)
GFR, Est AFR Am: >60 mL/min (ref >60)
GFR, Estimated: >60 mL/min (ref >60)
Glucose, Bld: 117 mg/dL — ABNORMAL HIGH (ref 70–99)
Potassium: 3.8 mmol/L (ref 3.5–5.1)
Sodium: 138 mmol/L (ref 135–145)
Total Bilirubin: 0.2 mg/dL — ABNORMAL LOW (ref 0.3–1.2)
Total Protein: 6.4 g/dL — ABNORMAL LOW (ref 6.5–8.1)

## 2019-04-06 MED ORDER — SODIUM CHLORIDE 0.9% FLUSH
10.0000 mL | Freq: Once | INTRAVENOUS | Status: AC | PRN
  Administered 2019-04-06: 09:00:00 10 mL
  Filled 2019-04-06: qty 10

## 2019-04-06 MED ORDER — SUCRALFATE 1 GM/10ML PO SUSP: 1.0000 g | Freq: Three times a day (TID) | ORAL | 0 refills | Status: DC

## 2019-04-06 MED ORDER — FLUCONAZOLE 150 MG PO TABS: 150.0000 mg | ORAL_TABLET | Freq: Every day | ORAL | 0 refills | Status: AC

## 2019-04-06 MED ORDER — HEPARIN SOD (PORK) LOCK FLUSH 100 UNIT/ML IV SOLN
500.0000 [IU] | Freq: Once | INTRAVENOUS | Status: AC | PRN
  Administered 2019-04-06: 500 [IU]
  Filled 2019-04-06: qty 5

## 2019-04-06 MED FILL — SUCRALFATE 1 GM/10ML SUSP: 1 | 11 days supply | Qty: 420 | Fill #0

## 2019-04-06 MED FILL — FLUCONAZOLE 150 MG TABS: 150 | 14 days supply | Qty: 14 | Fill #0

## 2019-04-06 NOTE — Telephone Encounter (Signed)
Marcia King gave critical lab values on patient. WBC is 2.8, platelets are 23. Lab results given to MD.

## 2019-04-06 NOTE — Progress Notes (Signed)
Patient has about one week left of her Eliquis. She is uninsured and cannot afford a refill. She has applied for free medication through the manufacturer but application times are extended due to covid and the holidays.   Reached out to our pharmacist, and the specialty pharmacy at [facility]. Our [facility] location has some samples that they can provide patient. She will receive 3 weeks of samples to bridge her until we can get a response from her application with the manufacturer.

## 2019-04-06 NOTE — Telephone Encounter (Signed)
No change in appts per 12/28 los

## 2019-04-06 NOTE — Telephone Encounter (Signed)
Oral Chemotherapy Pharmacist Encounter  Dispensed samples to patient:  Medication: Eliquis 5mg  Instructions: Take 1 tablet by mouth twice daily. Quantity dispensed: 42 Days supply: 21 Manufacturer: [place] Lot: [id number] Exp: 04/2021 Verified by:     Samples delivered to [facility] at [facility] on 04/06/2019.  Given to [employer], R[place].    [place] [facility] [place] Phone [phone] Fax [phone] 04/06/2019 10:49 AM

## 2019-04-06 NOTE — Progress Notes (Signed)
[facility] OFFICE PROGRESS NOTE  Patient Care Team: Patient, No Pcp Per as PCP - General (General Practice) [name], MD as Medical Oncologist (Oncology) Cordelia Poche, RN as Oncology Nurse Navigator  HEME/ONC OVERVIEW: 1. Limited stage small cell lung cancer, Stage IIIA ([id number]) -01/2019:   Large RUL mass w/ thoracic adenopathy and a second mass/consolidation in the RUL periphery on CTA chest  FDG-avid RUL mass with suspected mediastinal invasion, R paratracheal adenopathy; indeterminate consolidation in R apex (post-obstructive pneumonitis vs. satellite lesion in the same lobe)  MRI brain negative for malignancy -02/2019: bronchoscopy w/ biopsy of the RUL mass and 7R LN, path showed small cell lung cancer  -02/2019 - present: concurrent chemoRT with cisplatin/etoposide   2. 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 Port placement in 02/2019   TREATMENT REGIMEN:  02/24/2019 - present: concurrent chemoradiation with cisplatin/etoposide, plan for 4 cycles; RT started on 03/17/2019  03/2019 - present: Eliquis 5mg  BID   ASSESSMENT & PLAN:   Limited stage small cell lung cancer, Stage IIIA ([id number]) -S/p one cycle of cisplatin/etoposide; treatment complicated by severe pancytopenia and neutropenic fever; G-CSF added with Cycle 2 of chemotherapy -See the management of chemotherapy toxicities below -Cycle 3 of chemotherapy scheduled on 04/14/2019; if labs remain low, especially thrombocytopenia, we will have to consider dose reduction, likely with etoposide first  -PRN anti-emetics: Zofran, Compazine, dexamethasone and Ativan   Chemotherapy-associated leukopenia -Secondary to chemotherapy -WBC 2.8k with ANC ~1700, stable   -Patient denies any symptoms of infection -We will monitor for now  Chemotherapy-associated anemia -Secondary to chemotherapy -Hgb 9.2 today, lower than the last visit  -Patient denies any symptom of  bleeding -We will monitor for now  Chemotherapy-associated thrombocytopenia -Secondary to chemotherapy -Plts 23k, significantly lower than the last visit  -Patient denies any symptoms of bleeding or excess bruising, such as epistaxis, hematochezia, melena, or hematuria -We will monitor for now -If thrombocytopenia persists at the start of Cycle 3 of chemotherapy, we will consider delaying chemotherapy or adjusting chemotherapy dose (likely etoposide)  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 -On Eliquis since early 03/2019 -Currently tolerating Eliquis well without abnormal bleeding or bruising -Continue Eliquis 5mg  BID; copay assistance pending   Mid-sternal pain -Likely multifactorial, including radiation-induced esophagitis, reflux and possible candida esophagitis -No other associated symptoms to suggest cardiac etiology  -Oral candidiasis resolved with nystatin and fluconazole; I have prescribed two more weeks of PO fluconazole -In addition, I have added Carafate for possible radiation-induced esophagitis -We can also consider adding viscous lidocaine if the pain persists   Cancer-related pain -Likely due to pleural invasion by one of the RUL lung masses -Currently on MS-Contin 15mg  BID and Norco 7.5/325 q8hrs PRN; excess sedation with IR morphine -Pain reasonably controlled -Continue the current regimen for now   No orders of the defined types were placed in this encounter.  All questions were answered. The patient knows to call the clinic with any problems, questions or concerns. No barriers to learning was detected.  Return on 04/14/2019 for Cycle 3 of chemotherapy.  [name], MD 04/06/2019 10:02 AM  CHIEF COMPLAINT: "I am doing okay"  INTERVAL HISTORY: Marcia King returns clinic for follow-up of limited stage small cell lung cancer on definitive chemoradiation.  Patient reports that starting about a week ago, she developed a burning pain in the throat extending to the mid  sternum area, constant, exacerbated by eating and drinking, for which  she tried Norco without significant improvement.  She called the clinic and was prescribed nystatin swish/swallow, fluconazole and Protonix.  She feels that the pain has eased and is now intermittent, still exacerbated by eating.  She denies any associated shortness of breath, diaphoresis, palpitation, or pain radiating to the arms.  Her right-sided chest pain is relatively well controlled with MS-Contin and PRN Norco since starting treatment.  She is tolerating Eliquis well without any abnormal bleeding or bruising.  She denies any other complaint today.  REVIEW OF SYSTEMS:   Constitutional: ( - ) fevers, ( - )  chills , ( - ) night sweats Eyes: ( - ) blurriness of vision, ( - ) double vision, ( - ) watery eyes Ears, nose, mouth, throat, and face: ( - ) mucositis, ( - ) sore throat Respiratory: ( - ) cough, ( - ) dyspnea, ( - ) wheezes Cardiovascular: ( - ) palpitation, ( - ) chest discomfort, ( - ) lower extremity swelling Gastrointestinal:  ( - ) nausea, ( - ) heartburn, ( - ) change in bowel habits Skin: ( - ) abnormal skin rashes Lymphatics: ( - ) new lymphadenopathy, ( - ) easy bruising Neurological: ( - ) numbness, ( - ) tingling, ( - ) new weaknesses Behavioral/Psych: ( - ) mood change, ( - ) new changes  All other systems were reviewed with the patient and are negative.  SUMMARY OF ONCOLOGIC HISTORY: Oncology History  Small cell lung cancer ([place])  01/28/2019 Initial Diagnosis   Lung cancer ([place])   01/28/2019 Imaging   CTA chest: IMPRESSION: 1. No demonstrable pulmonary embolus. No thoracic aortic aneurysm or dissection.   2. Mass arising in the right upper lobe extending into the right hilum measuring 3.1 x 2.5 cm. Questionable second mass in the right upper lobe more peripherally and anteriorly measuring 2.4 x 1.7 cm versus consolidation in this area. There is adenopathy in the right hilum and right  paratracheal regions. Neoplastic involvement is felt to be present. It may be prudent to correlate with nuclear medicine PET study to further characterize.   3. Underlying emphysematous change with areas of fibrosis throughout the periphery of each lung. Fibrosis is somewhat evenly distributed between upper and lower lobe regions. There is atelectatic change in the lung bases, more on the right than on the left.   Emphysema (ICD10-J43.9).   02/06/2019 Imaging   PET:   IMPRESSION: Right upper lobe perihilar lung mass is intensely hypermetabolic compatible with primary bronchogenic carcinoma. Suspect mediastinal invasion with hypermetabolic right paratracheal adenopathy. Assuming a non-small cell neoplasm this is favored to represent a T4N1M0 lesion or stage IIIa disease.   Indeterminate nodular area of subpleural consolidation in with thin the lateral right apex has an SUV max of 4.96. This may represent an area of postobstructive pneumonitis versus satellite lesion in same lobe.   Aortic Atherosclerosis (ICD10-I70.0) and Emphysema (ICD10-J43.9).   02/07/2019 Imaging   MRI brain: IMPRESSION: 1. Normal MRI appearance the brain. No evidence for metastatic disease to the brain. 2. Left mastoid effusion without obstructing nasopharyngeal lesion.   02/12/2019 Pathology Results   Specimen Submitted:  A. LUNG, RIGHT UPPER LOBE, LAVAGE:    FINAL MICROSCOPIC DIAGNOSIS:  - Atypical cells present  - See comment   SPECIMEN ADEQUACY:  Satisfactory for evaluation   DIAGNOSTIC COMMENTS:  Mixed acute and chronic inflammation is present.  There are rare atypical cells present on the heme slides only.  These  are not seen on the  cytology smears.    02/12/2019 Pathology Results   FINAL MICROSCOPIC DIAGNOSIS:   A.   MASS, RIGHT HILAR, FINE NEEDLE ASPIRATION:  -  Malignant cells consistent with small cell carcinoma  -  See comment   B. LYMPH NODE, 7 NODE, FINE NEEDLE ASPIRATION:  -   No malignant cells identified   COMMENT:   A.  Immunohistochemistry performed on the cell block shows the  neoplastic cells are positive for TTF-1, CD56, and synaptophysin but  negative for chromogranin, cytokeratin 5 6 and p40.  Overall, the  combined morphology and immunophenotype are consistent with small cell  carcinoma.    02/18/2019 Cancer Staging   Staging form: Lung, AJCC 8th Edition - Clinical: Stage IIIA (cT4, cN1, cM0) - Signed by [name], MD on 02/18/2019   02/24/2019 -  Chemotherapy   The patient had palonosetron (ALOXI) injection 0.25 mg, 0.25 mg, Intravenous,  Once, 2 of 4 cycles Administration: 0.25 mg (02/24/2019), 0.25 mg (03/24/2019) pegfilgrastim-cbqv (UDENYCA) injection 6 mg, 6 mg, Subcutaneous, Once, 1 of 3 cycles CISplatin (PLATINOL) 125 mg in sodium chloride 0.9 % 500 mL chemo infusion, 75 mg/m2 = 125 mg (100 % of original dose 75 mg/m2), Intravenous,  Once, 2 of 4 cycles Dose modification: 75 mg/m2 (original dose 75 mg/m2, Cycle 1, Reason: Provider Judgment) Administration: 125 mg (02/24/2019), 125 mg (03/24/2019) etoposide (VEPESID) 170 mg in sodium chloride 0.9 % 500 mL chemo infusion, 100 mg/m2 = 170 mg, Intravenous,  Once, 2 of 4 cycles Dose modification: 80 mg/m2 (original dose 100 mg/m2, Cycle 2, Reason: Dose not tolerated), 100 mg/m2 (original dose 100 mg/m2, Cycle 2, Reason: Provider Judgment) Administration: 170 mg (02/24/2019), 170 mg (02/25/2019), 170 mg (02/26/2019), 170 mg (03/24/2019), 170 mg (03/25/2019), 170 mg (03/26/2019) fosaprepitant (EMEND) 150 mg, dexamethasone (DECADRON) 12 mg in sodium chloride 0.9 % 145 mL IVPB, , Intravenous,  Once, 2 of 4 cycles Administration:  (02/24/2019),  (03/24/2019)  for chemotherapy treatment.      I have reviewed the past medical history, past surgical history, social history and family history with the patient and they are unchanged from previous note.  ALLERGIES:  has No Known Allergies.  MEDICATIONS:   Current Outpatient Medications  Medication Sig Dispense Refill  . albuterol (PROVENTIL HFA;VENTOLIN HFA) 108 (90 Base) MCG/ACT inhaler Inhale 1-2 puffs into the lungs every 4 (four) hours as needed for wheezing or shortness of breath.     Marland Kitchen apixaban (ELIQUIS) 5 MG TABS tablet Take 1 tablet (5 mg total) by mouth 2 (two) times daily. 60 tablet 11  . aspirin EC 325 MG tablet Take by mouth.    . calcium-vitamin D (OSCAL WITH D) 500-200 MG-UNIT TABS tablet Take 1 tablet by mouth every morning.    Marland Kitchen dexamethasone (DECADRON) 4 MG tablet Take 2 tablets two times a day for 1 day on day 4 after cisplatin chemotherapy. Take with food. 30 tablet 1  . diphenoxylate-atropine (LOMOTIL) 2.5-0.025 MG tablet Take 2 tablets by mouth 4 (four) times daily as needed for diarrhea or loose stools. 30 tablet 0  . fluconazole (DIFLUCAN) 150 MG tablet Take 1 tablet (150 mg total) by mouth daily for 14 days. 14 tablet 0  . HYDROcodone-acetaminophen (NORCO) 7.5-325 MG tablet Take 1 tablet by mouth every 8 (eight) hours as needed for moderate pain. 60 tablet 0  . ibuprofen (ADVIL) 800 MG tablet Take 1 tablet (800 mg total) by mouth 3 (three) times daily. (Patient taking differently: Take 800 mg by mouth every  8 (eight) hours as needed (pain). ) 21 tablet 0  . lidocaine-prilocaine (EMLA) cream Apply to affected area once 30 g 3  . LORazepam (ATIVAN) 0.5 MG tablet Take 1 tablet (0.5 mg total) by mouth every 6 (six) hours as needed (Nausea or vomiting). 30 tablet 0  . morphine (MS CONTIN) 15 MG 12 hr tablet Take 1 tablet (15 mg total) by mouth every 12 (twelve) hours. 60 tablet 0  . nystatin (MYCOSTATIN) 100000 UNIT/ML suspension Take 5 mLs (500,000 Units total) by mouth 4 (four) times daily. Swish and swallow 60 mL 2  . ondansetron (ZOFRAN) 8 MG tablet Take 1 tablet (8 mg total) by mouth 2 (two) times daily as needed. Start on the third day after cisplatin chemotherapy. 30 tablet 1  . pantoprazole (PROTONIX) 40 MG tablet Take 1  tablet (40 mg total) by mouth daily. 30 tablet 3  . prochlorperazine (COMPAZINE) 10 MG tablet Take 1 tablet (10 mg total) by mouth every 6 (six) hours as needed (Nausea or vomiting). 30 tablet 1  . sucralfate (CARAFATE) 1 GM/10ML suspension Take 10 mLs (1 g total) by mouth 4 (four) times daily -  with meals and at bedtime. 420 mL 0   No current facility-administered medications for this visit.   Facility-Administered Medications Ordered in Other Visits  Medication Dose Route Frequency Provider Last Rate Last Admin  . pegfilgrastim-cbqv (UDENYCA) injection 6 mg  6 mg Subcutaneous Once [name], MD        PHYSICAL EXAMINATION: ECOG PERFORMANCE STATUS: 1 - Symptomatic but completely ambulatory  Today's Vitals   04/06/19 0944  BP: 136/88  Pulse: 95  Resp: 18  Temp: 99.3 F (37.4 C)  TempSrc: Temporal  SpO2: 100%  Weight: 141 lb 6.4 oz (64.1 kg)  Height: 5' (1.524 m)  PainSc: 5    Body mass index is 27.62 kg/m.  Filed Weights   04/06/19 0944  Weight: 141 lb 6.4 oz (64.1 kg)    GENERAL: alert, no distress and comfortable SKIN: skin color, texture, turgor are normal, no rashes or significant lesions EYES: conjunctiva are pink and non-injected, sclera clear OROPHARYNX: no exudate, no erythema; lips, buccal mucosa, and tongue normal  NECK: supple, non-tender LUNGS: clear to auscultation with normal breathing effort HEART: regular rate & rhythm and no murmurs and no lower extremity edema ABDOMEN: soft, non-tender, non-distended, normal bowel sounds Musculoskeletal: no cyanosis of digits and no clubbing  PSYCH: alert & oriented x 3, fluent speech  LABORATORY DATA:  I have reviewed the data as listed    Component Value Date/Time   NA 138 04/06/2019 0920   K 3.8 04/06/2019 0920   CL 105 04/06/2019 0920   CO2 26 04/06/2019 0920   GLUCOSE 117 (H) 04/06/2019 0920   BUN 15 04/06/2019 0920   CREATININE 0.83 04/06/2019 0920   CALCIUM 9.0 04/06/2019 0920   PROT 6.4 (L)  04/06/2019 0920   ALBUMIN 3.5 04/06/2019 0920   AST 12 (L) 04/06/2019 0920   ALT 14 04/06/2019 0920   ALKPHOS 97 04/06/2019 0920   BILITOT 0.2 (L) 04/06/2019 0920   GFRNONAA >60 04/06/2019 0920   GFRAA >60 04/06/2019 0920    No results found for: SPEP, UPEP  Lab Results  Component Value Date   WBC 2.8 (L) 04/06/2019   NEUTROABS 1.7 04/06/2019   HGB 9.2 (L) 04/06/2019   HCT 27.3 (L) 04/06/2019   MCV 86.9 04/06/2019   PLT 23 (L) 04/06/2019      Chemistry  Component Value Date/Time   NA 138 04/06/2019 0920   K 3.8 04/06/2019 0920   CL 105 04/06/2019 0920   CO2 26 04/06/2019 0920   BUN 15 04/06/2019 0920   CREATININE 0.83 04/06/2019 0920      Component Value Date/Time   CALCIUM 9.0 04/06/2019 0920   ALKPHOS 97 04/06/2019 0920   AST 12 (L) 04/06/2019 0920   ALT 14 04/06/2019 0920   BILITOT 0.2 (L) 04/06/2019 0920       RADIOGRAPHIC STUDIES: I have personally reviewed the radiological images as listed below and agreed with the findings in the report. No results found.

## 2019-04-06 NOTE — Patient Instructions (Signed)

## 2019-04-07 ENCOUNTER — Inpatient Hospital Stay: Payer: Medicaid Other

## 2019-04-07 ENCOUNTER — Ambulatory Visit: Payer: Self-pay | Admitting: Family

## 2019-04-07 ENCOUNTER — Other Ambulatory Visit: Payer: Self-pay

## 2019-04-07 ENCOUNTER — Inpatient Hospital Stay: Payer: Medicaid Other | Admitting: Hematology

## 2019-04-07 ENCOUNTER — Ambulatory Visit: Payer: Self-pay

## 2019-04-08 ENCOUNTER — Inpatient Hospital Stay: Payer: Medicaid Other

## 2019-04-09 ENCOUNTER — Inpatient Hospital Stay: Payer: Medicaid Other

## 2019-04-14 ENCOUNTER — Inpatient Hospital Stay: Payer: Medicaid Other | Attending: Hematology

## 2019-04-14 ENCOUNTER — Encounter: Payer: Self-pay | Admitting: Hematology

## 2019-04-14 ENCOUNTER — Inpatient Hospital Stay: Payer: Medicaid Other

## 2019-04-14 ENCOUNTER — Other Ambulatory Visit: Payer: Self-pay | Admitting: Hematology

## 2019-04-14 ENCOUNTER — Other Ambulatory Visit: Payer: Self-pay

## 2019-04-14 ENCOUNTER — Encounter: Payer: Self-pay | Admitting: *Deleted

## 2019-04-14 ENCOUNTER — Inpatient Hospital Stay (HOSPITAL_BASED_OUTPATIENT_CLINIC_OR_DEPARTMENT_OTHER): Payer: Medicaid Other | Admitting: Hematology

## 2019-04-14 ENCOUNTER — Ambulatory Visit: Payer: Self-pay

## 2019-04-14 ENCOUNTER — Ambulatory Visit: Payer: Self-pay | Admitting: Hematology

## 2019-04-14 VITALS — BP 132/88 | HR 114 | Temp 97.8°F | Resp 18

## 2019-04-14 DIAGNOSIS — Z7952 Long term (current) use of systemic steroids: Secondary | ICD-10-CM | POA: Diagnosis not present

## 2019-04-14 DIAGNOSIS — Z79899 Other long term (current) drug therapy: Secondary | ICD-10-CM

## 2019-04-14 DIAGNOSIS — T451X5A Adverse effect of antineoplastic and immunosuppressive drugs, initial encounter: Secondary | ICD-10-CM | POA: Insufficient documentation

## 2019-04-14 DIAGNOSIS — J439 Emphysema, unspecified: Secondary | ICD-10-CM | POA: Insufficient documentation

## 2019-04-14 DIAGNOSIS — C3490 Malignant neoplasm of unspecified part of unspecified bronchus or lung: Secondary | ICD-10-CM

## 2019-04-14 DIAGNOSIS — C3411 Malignant neoplasm of upper lobe, right bronchus or lung: Secondary | ICD-10-CM | POA: Insufficient documentation

## 2019-04-14 DIAGNOSIS — D701 Agranulocytosis secondary to cancer chemotherapy: Secondary | ICD-10-CM | POA: Insufficient documentation

## 2019-04-14 DIAGNOSIS — D6959 Other secondary thrombocytopenia: Secondary | ICD-10-CM

## 2019-04-14 DIAGNOSIS — G893 Neoplasm related pain (acute) (chronic): Secondary | ICD-10-CM

## 2019-04-14 DIAGNOSIS — D6481 Anemia due to antineoplastic chemotherapy: Secondary | ICD-10-CM | POA: Insufficient documentation

## 2019-04-14 DIAGNOSIS — Z5111 Encounter for antineoplastic chemotherapy: Secondary | ICD-10-CM | POA: Diagnosis present

## 2019-04-14 DIAGNOSIS — Z791 Long term (current) use of non-steroidal anti-inflammatories (NSAID): Secondary | ICD-10-CM | POA: Diagnosis not present

## 2019-04-14 DIAGNOSIS — Z5189 Encounter for other specified aftercare: Secondary | ICD-10-CM | POA: Insufficient documentation

## 2019-04-14 DIAGNOSIS — Z7982 Long term (current) use of aspirin: Secondary | ICD-10-CM | POA: Insufficient documentation

## 2019-04-14 DIAGNOSIS — Z7901 Long term (current) use of anticoagulants: Secondary | ICD-10-CM

## 2019-04-14 DIAGNOSIS — C3481 Malignant neoplasm of overlapping sites of right bronchus and lung: Secondary | ICD-10-CM

## 2019-04-14 DIAGNOSIS — I82290 Acute embolism and thrombosis of other thoracic veins: Secondary | ICD-10-CM | POA: Diagnosis not present

## 2019-04-14 LAB — CMP (CANCER CENTER ONLY)
ALT: 15 U/L (ref 0–44)
AST: 19 U/L (ref 15–41)
Albumin: 3.8 g/dL (ref 3.5–5.0)
Alkaline Phosphatase: 90 U/L (ref 38–126)
Anion gap: 10 (ref 5–15)
BUN: 18 mg/dL (ref 6–20)
CO2: 23 mmol/L (ref 22–32)
Calcium: 9.4 mg/dL (ref 8.9–10.3)
Chloride: 104 mmol/L (ref 98–111)
Creatinine: 0.94 mg/dL (ref 0.44–1.00)
GFR, Est AFR Am: >60 mL/min (ref >60)
GFR, Estimated: >60 mL/min (ref >60)
Glucose, Bld: 163 mg/dL — ABNORMAL HIGH (ref 70–99)
Potassium: 3.9 mmol/L (ref 3.5–5.1)
Sodium: 137 mmol/L (ref 135–145)
Total Bilirubin: 0.2 mg/dL — ABNORMAL LOW (ref 0.3–1.2)
Total Protein: 6.9 g/dL (ref 6.5–8.1)

## 2019-04-14 LAB — CBC WITH DIFFERENTIAL (CANCER CENTER ONLY)
Abs Immature Granulocytes: 0.05 10*3/uL (ref 0.00–0.07)
Basophils Absolute: 0 10*3/uL (ref 0.0–0.1)
Basophils Relative: 1 %
Eosinophils Absolute: 0.2 10*3/uL (ref 0.0–0.5)
Eosinophils Relative: 8 %
HCT: 29.9 % — ABNORMAL LOW (ref 36.0–46.0)
Hemoglobin: 10 g/dL — ABNORMAL LOW (ref 12.0–15.0)
Immature Granulocytes: 2 %
Lymphocytes Relative: 13 %
Lymphs Abs: 0.4 10*3/uL — ABNORMAL LOW (ref 0.7–4.0)
MCH: 29.7 pg (ref 26.0–34.0)
MCHC: 33.4 g/dL (ref 30.0–36.0)
MCV: 88.7 fL (ref 80.0–100.0)
Monocytes Absolute: 0.3 10*3/uL (ref 0.1–1.0)
Monocytes Relative: 12 %
Neutro Abs: 1.8 10*3/uL (ref 1.7–7.7)
Neutrophils Relative %: 64 %
Platelet Count: 54 10*3/uL — ABNORMAL LOW (ref 150–400)
RBC: 3.37 MIL/uL — ABNORMAL LOW (ref 3.87–5.11)
RDW: 17.2 % — ABNORMAL HIGH (ref 11.5–15.5)
WBC Count: 2.7 10*3/uL — ABNORMAL LOW (ref 4.0–10.5)
nRBC: 0 % (ref 0.0–0.2)

## 2019-04-14 MED ORDER — SODIUM CHLORIDE 0.9% FLUSH
10.0000 mL | Freq: Once | INTRAVENOUS | Status: AC | PRN
  Administered 2019-04-14: 10 mL
  Filled 2019-04-14: qty 10

## 2019-04-14 MED ORDER — HYDROCODONE-ACETAMINOPHEN 7.5-325 MG PO TABS: 1.0000 | ORAL_TABLET | Freq: Three times a day (TID) | ORAL | 0 refills | Status: DC | PRN

## 2019-04-14 MED ORDER — SODIUM CHLORIDE 0.9 % IV SOLN
Freq: Once | INTRAVENOUS | Status: AC
  Filled 2019-04-14: qty 250

## 2019-04-14 MED ORDER — HEPARIN SOD (PORK) LOCK FLUSH 100 UNIT/ML IV SOLN
500.0000 [IU] | Freq: Once | INTRAVENOUS | Status: AC | PRN
  Administered 2019-04-14: 500 [IU]
  Filled 2019-04-14: qty 5

## 2019-04-14 MED FILL — HYDROCODON-APAP 7.5-325: 7.5-325 | 20 days supply | Qty: 60 | Fill #0

## 2019-04-14 NOTE — Progress Notes (Signed)
  Oncology Nurse Navigator Documentation  Navigator Location: [facility] (04/14/19 0981)   )Navigator Encounter Type: Treatment (04/14/19 0919)                     Patient Visit Type: MedOnc (04/14/19 0919) Treatment Phase: Active Tx (04/14/19 0919) Barriers/Navigation Needs: No Barriers At This Time (04/14/19 0919)   Interventions: Psycho-Social Support (04/14/19 0919)                      Time Spent with Patient: 15 (04/14/19 0919)

## 2019-04-14 NOTE — Patient Instructions (Signed)
Implanted Port Insertion, Care After °This sheet gives you information about how to care for yourself after your procedure. Your health care provider may also give you more specific instructions. If you have problems or questions, contact your health care provider. °What can I expect after the procedure? °After the procedure, it is common to have: °· Discomfort at the port insertion site. °· Bruising on the skin over the port. This should improve over 3-4 days. °Follow these instructions at home: °Port care °· After your port is placed, you will get a manufacturer's information card. The card has information about your port. Keep this card with you at all times. °· Take care of the port as told by your health care provider. Ask your health care provider if you or a family member can get training for taking care of the port at home. A home health care nurse may also take care of the port. °· Make sure to remember what type of port you have. °Incision care ° °  ° °· Follow instructions from your health care provider about how to take care of your port insertion site. Make sure you: °? Wash your hands with soap and water before and after you change your bandage (dressing). If soap and water are not available, use hand sanitizer. °? Change your dressing as told by your health care provider. °? Leave stitches (sutures), skin glue, or adhesive strips in place. These skin closures may need to stay in place for 2 weeks or longer. If adhesive strip edges start to loosen and curl up, you may trim the loose edges. Do not remove adhesive strips completely unless your health care provider tells you to do that. °· Check your port insertion site every day for signs of infection. Check for: °? Redness, swelling, or pain. °? Fluid or blood. °? Warmth. °? Pus or a bad smell. °Activity °· Return to your normal activities as told by your health care provider. Ask your health care provider what activities are safe for you. °· Do not  lift anything that is heavier than 10 lb (4.5 kg), or the limit that you are told, until your health care provider says that it is safe. °General instructions °· Take over-the-counter and prescription medicines only as told by your health care provider. °· Do not take baths, swim, or use a hot tub until your health care provider approves. Ask your health care provider if you may take showers. You may only be allowed to take sponge baths. °· Do not drive for 24 hours if you were given a sedative during your procedure. °· Wear a medical alert bracelet in case of an emergency. This will tell any health care providers that you have a port. °· Keep all follow-up visits as told by your health care provider. This is important. °Contact a health care provider if: °· You cannot flush your port with saline as directed, or you cannot draw blood from the port. °· You have a fever or chills. °· You have redness, swelling, or pain around your port insertion site. °· You have fluid or blood coming from your port insertion site. °· Your port insertion site feels warm to the touch. °· You have pus or a bad smell coming from the port insertion site. °Get help right away if: °· You have chest pain or shortness of breath. °· You have bleeding from your port that you cannot control. °Summary °· Take care of the port as told by your health   care provider. Keep the manufacturer's information card with you at all times. °· Change your dressing as told by your health care provider. °· Contact a health care provider if you have a fever or chills or if you have redness, swelling, or pain around your port insertion site. °· Keep all follow-up visits as told by your health care provider. °This information is not intended to replace advice given to you by your health care provider. Make sure you discuss any questions you have with your health care provider. °Document Revised: 10/22/2017 Document Reviewed: 10/22/2017 °Elsevier Patient Education ©  2020 [employer] ° °

## 2019-04-14 NOTE — Patient Instructions (Signed)
[facility] Discharge Instructions for Patients Receiving Chemotherapy  Today you received the following chemotherapy agents:  Cisplatin and Etoposide  To help prevent nausea and vomiting after your treatment, we encourage you to take your nausea medication as ordered per MD.    If you develop nausea and vomiting that is not controlled by your nausea medication, call the clinic.   BELOW ARE SYMPTOMS THAT SHOULD BE REPORTED IMMEDIATELY:  *FEVER GREATER THAN 100.5 F  *CHILLS WITH OR WITHOUT FEVER  NAUSEA AND VOMITING THAT IS NOT CONTROLLED WITH YOUR NAUSEA MEDICATION  *UNUSUAL SHORTNESS OF BREATH  *UNUSUAL BRUISING OR BLEEDING  TENDERNESS IN MOUTH AND THROAT WITH OR WITHOUT PRESENCE OF ULCERS  *URINARY PROBLEMS  *BOWEL PROBLEMS  UNUSUAL RASH Items with * indicate a potential emergency and should be followed up as soon as possible.  Feel free to call the clinic should you have any questions or concerns. The clinic phone number is [phone].  Please show the [facility] at check-in to the Emergency Department and triage nurse.

## 2019-04-14 NOTE — Progress Notes (Signed)
[place] OFFICE PROGRESS NOTE  Marcia King Care Team: Marcia King, No Pcp Per as PCP - General (General Practice) [name], MD as Medical Oncologist (Oncology) Cordelia Poche, RN as Oncology Nurse Navigator  HEME/ONC OVERVIEW: 1. Limited stage small cell lung cancer, Stage IIIA (TD1V6H6) -01/2019:   Large RUL mass w/ thoracic adenopathy and a second mass/consolidation in the RUL periphery on CTA chest  FDG-avid RUL mass with suspected mediastinal invasion, R paratracheal adenopathy; indeterminate consolidation in R apex (post-obstructive pneumonitis vs. satellite lesion in the same lobe)  MRI brain negative for malignancy -02/2019: bronchoscopy w/ biopsy of the RUL mass and 7R LN, path showed small cell lung cancer  -02/2019 - present: concurrent chemoRT with cisplatin/etoposide   2. 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 Port placement in 02/2019   TREATMENT REGIMEN:  02/24/2019 - present: concurrent chemoradiation with cisplatin/etoposide, plan for 4 cycles; RT started on 03/17/2019  03/2019 - present: Eliquis '5mg'$  BID   ASSESSMENT & PLAN:   Limited stage small cell lung cancer, Stage IIIA ([id number]) -S/p 2 cyclse of cisplatin/etoposide; treatment complicated by severe pancytopenia and neutropenic fever; G-CSF added with Cycle 2 of chemotherapy -Labs notable for persistent, albeit improving, thrombocytopenia -I will delay treatment by one week, and reduce etoposide from 100 to '80mg'$ /m2; as small cell lung cancer is platinum-sensitive, I will avoid adjusting the dose of cisplatin for now -See the management of chemotherapy toxicities below -Plan for 4 cycles of chemotherapy, concurrent with radiation  -PRN anti-emetics: Zofran, Compazine, dexamethasone and Ativan   Chemotherapy-associated leukopenia -Secondary to chemotherapy -WBC 2.7k with ANC ~1800, stable   -Marcia King denies any symptoms of infection -We will monitor for  now  Chemotherapy-associated anemia -Secondary to chemotherapy -Hgb 10.0 today, slightly improving   -Marcia King denies any symptom of bleeding -We will monitor for now  Chemotherapy-associated thrombocytopenia -Secondary to chemotherapy -Plts 54k, improving  -Marcia King denies any symptoms of bleeding or excess bruising, such as epistaxis, hematochezia, melena, or hematuria -Dose reduction of etoposide as outlined above  -We will monitor for now  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 -On Eliquis since early 03/2019 -Currently tolerating Eliquis well without abnormal bleeding or bruising -Continue Eliquis '5mg'$  BID; copay assistance pending   Cancer-related pain -Likely due to pleural invasion by one of the RUL lung masses -Currently on MS-Contin '15mg'$  BID and Norco 7.5/325 q8hrs PRN; excess sedation with IR morphine -Pain reasonably controlled -Continue the current regimen for now   No orders of the defined types were placed in this encounter.  All questions were answered. The Marcia King knows to call the clinic with any problems, questions or concerns. No barriers to learning was detected.  Return in 1 week for Cycle 3 of chemotherapy.   [name], MD 04/14/2019 9:48 AM  CHIEF COMPLAINT: "I am doing okay"  INTERVAL HISTORY: Ms. Furney returns clinic for follow-up of limited stage small cell lung cancer on concurrent chemoradiation.  Marcia King reports that she has been doing relatively well since last visit, and he is eating and drinking well without any limitation.  Her right-sided chest pain is relatively well controlled with MS-Contin and PRN Norco.  She denies any abdominal pain, nausea, vomiting, diarrhea.  She is compliant Eliquis and denies any abnormal bleeding/bruising.  She denies any other complaint today.  REVIEW OF SYSTEMS:   Constitutional: ( - ) fevers, ( - )  chills , ( - ) night sweats Eyes: ( - )  blurriness of vision, ( - ) double vision, ( - ) watery eyes Ears, nose,  mouth, throat, and face: ( - ) mucositis, ( - ) sore throat Respiratory: ( - ) cough, ( - ) dyspnea, ( - ) wheezes Cardiovascular: ( - ) palpitation, ( - ) chest discomfort, ( - ) lower extremity swelling Gastrointestinal:  ( - ) nausea, ( - ) heartburn, ( - ) change in bowel habits Skin: ( - ) abnormal skin rashes Lymphatics: ( - ) new lymphadenopathy, ( - ) easy bruising Neurological: ( - ) numbness, ( - ) tingling, ( - ) new weaknesses Behavioral/Psych: ( - ) mood change, ( - ) new changes  All other systems were reviewed with the Marcia King and are negative.  SUMMARY OF ONCOLOGIC HISTORY: Oncology History  Small cell lung cancer ([place])  01/28/2019 Initial Diagnosis   Lung cancer ([place])   01/28/2019 Imaging   CTA chest: IMPRESSION: 1. No demonstrable pulmonary embolus. No thoracic aortic aneurysm or dissection.   2. Mass arising in the right upper lobe extending into the right hilum measuring 3.1 x 2.5 cm. Questionable second mass in the right upper lobe more peripherally and anteriorly measuring 2.4 x 1.7 cm versus consolidation in this area. There is adenopathy in the right hilum and right paratracheal regions. Neoplastic involvement is felt to be present. It may be prudent to correlate with nuclear medicine PET study to further characterize.   3. Underlying emphysematous change with areas of fibrosis throughout the periphery of each lung. Fibrosis is somewhat evenly distributed between upper and lower lobe regions. There is atelectatic change in the lung bases, more on the right than on the left.   Emphysema (ICD10-J43.9).   02/06/2019 Imaging   PET:   IMPRESSION: Right upper lobe perihilar lung mass is intensely hypermetabolic compatible with primary bronchogenic carcinoma. Suspect mediastinal invasion with hypermetabolic right paratracheal adenopathy. Assuming a non-small cell neoplasm this is favored to represent a T4N1M0 lesion or stage IIIa disease.    Indeterminate nodular area of subpleural consolidation in with thin the lateral right apex has an SUV max of 4.96. This may represent an area of postobstructive pneumonitis versus satellite lesion in same lobe.   Aortic Atherosclerosis (ICD10-I70.0) and Emphysema (ICD10-J43.9).   02/07/2019 Imaging   MRI brain: IMPRESSION: 1. Normal MRI appearance the brain. No evidence for metastatic disease to the brain. 2. Left mastoid effusion without obstructing nasopharyngeal lesion.   02/12/2019 Pathology Results   Specimen Submitted:  A. LUNG, RIGHT UPPER LOBE, LAVAGE:    FINAL MICROSCOPIC DIAGNOSIS:  - Atypical cells present  - See comment   SPECIMEN ADEQUACY:  Satisfactory for evaluation   DIAGNOSTIC COMMENTS:  Mixed acute and chronic inflammation is present.  There are rare atypical cells present on the heme slides only.  These  are not seen on the cytology smears.    02/12/2019 Pathology Results   FINAL MICROSCOPIC DIAGNOSIS:   A.   MASS, RIGHT HILAR, FINE NEEDLE ASPIRATION:  -  Malignant cells consistent with small cell carcinoma  -  See comment   B. LYMPH NODE, 7 NODE, FINE NEEDLE ASPIRATION:  -  No malignant cells identified   COMMENT:   A.  Immunohistochemistry performed on the cell block shows the  neoplastic cells are positive for TTF-1, CD56, and synaptophysin but  negative for chromogranin, cytokeratin 5 6 and p40.  Overall, the  combined morphology and immunophenotype are consistent with small cell  carcinoma.    02/18/2019 Cancer Staging  Staging form: Lung, AJCC 8th Edition - Clinical: Stage IIIA (cT4, cN1, cM0) - Signed by [name], MD on 02/18/2019   02/24/2019 -  Chemotherapy   The Marcia King had palonosetron (ALOXI) injection 0.25 mg, 0.25 mg, Intravenous,  Once, 2 of 4 cycles Administration: 0.25 mg (02/24/2019), 0.25 mg (03/24/2019) pegfilgrastim-cbqv (UDENYCA) injection 6 mg, 6 mg, Subcutaneous, Once, 1 of 3 cycles CISplatin (PLATINOL) 125 mg in  sodium chloride 0.9 % 500 mL chemo infusion, 75 mg/m2 = 125 mg (100 % of original dose 75 mg/m2), Intravenous,  Once, 2 of 4 cycles Dose modification: 75 mg/m2 (original dose 75 mg/m2, Cycle 1, Reason: Provider Judgment) Administration: 125 mg (02/24/2019), 125 mg (03/24/2019) etoposide (VEPESID) 170 mg in sodium chloride 0.9 % 500 mL chemo infusion, 100 mg/m2 = 170 mg, Intravenous,  Once, 2 of 4 cycles Dose modification: 80 mg/m2 (original dose 100 mg/m2, Cycle 2, Reason: Dose not tolerated), 100 mg/m2 (original dose 100 mg/m2, Cycle 2, Reason: Provider Judgment), 80 mg/m2 (original dose 100 mg/m2, Cycle 3, Reason: Treatment Parameters Not Met) Administration: 170 mg (02/24/2019), 170 mg (02/25/2019), 170 mg (02/26/2019), 170 mg (03/24/2019), 170 mg (03/25/2019), 170 mg (03/26/2019) fosaprepitant (EMEND) 150 mg, dexamethasone (DECADRON) 12 mg in sodium chloride 0.9 % 145 mL IVPB, , Intravenous,  Once, 2 of 4 cycles Administration:  (02/24/2019),  (03/24/2019)  for chemotherapy treatment.      I have reviewed the past medical history, past surgical history, social history and family history with the Marcia King and they are unchanged from previous note.  ALLERGIES:  has No Known Allergies.  MEDICATIONS:  Current Outpatient Medications  Medication Sig Dispense Refill  . albuterol (PROVENTIL HFA;VENTOLIN HFA) 108 (90 Base) MCG/ACT inhaler Inhale 1-2 puffs into the lungs every 4 (four) hours as needed for wheezing or shortness of breath.     Marland Kitchen apixaban (ELIQUIS) 5 MG TABS tablet Take 1 tablet (5 mg total) by mouth 2 (two) times daily. 60 tablet 11  . aspirin EC 325 MG tablet Take by mouth.    . calcium-vitamin D (OSCAL WITH D) 500-200 MG-UNIT TABS tablet Take 1 tablet by mouth every morning.    Marland Kitchen dexamethasone (DECADRON) 4 MG tablet Take 2 tablets two times a day for 1 day on day 4 after cisplatin chemotherapy. Take with food. 30 tablet 1  . diphenoxylate-atropine (LOMOTIL) 2.5-0.025 MG tablet Take  2 tablets by mouth 4 (four) times daily as needed for diarrhea or loose stools. 30 tablet 0  . fluconazole (DIFLUCAN) 150 MG tablet Take 1 tablet (150 mg total) by mouth daily for 14 days. 14 tablet 0  . HYDROcodone-acetaminophen (NORCO) 7.5-325 MG tablet Take 1 tablet by mouth every 8 (eight) hours as needed for moderate pain. 60 tablet 0  . ibuprofen (ADVIL) 800 MG tablet Take 1 tablet (800 mg total) by mouth 3 (three) times daily. (Marcia King taking differently: Take 800 mg by mouth every 8 (eight) hours as needed (pain). ) 21 tablet 0  . lidocaine-prilocaine (EMLA) cream Apply to affected area once 30 g 3  . LORazepam (ATIVAN) 0.5 MG tablet Take 1 tablet (0.5 mg total) by mouth every 6 (six) hours as needed (Nausea or vomiting). 30 tablet 0  . morphine (MS CONTIN) 15 MG 12 hr tablet Take 1 tablet (15 mg total) by mouth every 12 (twelve) hours. 60 tablet 0  . nystatin (MYCOSTATIN) 100000 UNIT/ML suspension Take 5 mLs (500,000 Units total) by mouth 4 (four) times daily. Swish and swallow 60 mL 2  .  ondansetron (ZOFRAN) 8 MG tablet Take 1 tablet (8 mg total) by mouth 2 (two) times daily as needed. Start on the third day after cisplatin chemotherapy. 30 tablet 1  . pantoprazole (PROTONIX) 40 MG tablet Take 1 tablet (40 mg total) by mouth daily. 30 tablet 3  . prochlorperazine (COMPAZINE) 10 MG tablet Take 1 tablet (10 mg total) by mouth every 6 (six) hours as needed (Nausea or vomiting). 30 tablet 1  . sucralfate (CARAFATE) 1 GM/10ML suspension Take 10 mLs (1 g total) by mouth 4 (four) times daily -  with meals and at bedtime. 420 mL 0   No current facility-administered medications for this visit.   Facility-Administered Medications Ordered in Other Visits  Medication Dose Route Frequency Provider Last Rate Last Admin  . heparin lock flush 100 unit/mL  500 Units Intracatheter Once PRN [place], Holli Humbles, NP      . pegfilgrastim-cbqv [facility]) injection 6 mg  6 mg Subcutaneous Once [name], MD       . sodium chloride flush (NS) 0.9 % injection 10 mL  10 mL Intracatheter Once PRN [place], Holli Humbles, NP        PHYSICAL EXAMINATION: ECOG PERFORMANCE STATUS: 1 - Symptomatic but completely ambulatory  There were no vitals filed for this visit. There is no height or weight on file to calculate BMI.  There were no vitals filed for this visit.  GENERAL: alert, no distress and comfortable SKIN: skin color, texture, turgor are normal, no rashes or significant lesions EYES: conjunctiva are pink and non-injected, sclera clear OROPHARYNX: no exudate, no erythema; lips, buccal mucosa, and tongue normal  NECK: supple, non-tender LUNGS: clear to auscultation with normal breathing effort HEART: regular rate & rhythm and no murmurs and no lower extremity edema ABDOMEN: soft, non-tender, non-distended, normal bowel sounds Musculoskeletal: no cyanosis of digits and no clubbing  PSYCH: alert & oriented x 3, fluent speech  LABORATORY DATA:  I have reviewed the data as listed    Component Value Date/Time   NA 137 04/14/2019 0810   K 3.9 04/14/2019 0810   CL 104 04/14/2019 0810   CO2 23 04/14/2019 0810   GLUCOSE 163 (H) 04/14/2019 0810   BUN 18 04/14/2019 0810   CREATININE 0.94 04/14/2019 0810   CALCIUM 9.4 04/14/2019 0810   PROT 6.9 04/14/2019 0810   ALBUMIN 3.8 04/14/2019 0810   AST 19 04/14/2019 0810   ALT 15 04/14/2019 0810   ALKPHOS 90 04/14/2019 0810   BILITOT 0.2 (L) 04/14/2019 0810   GFRNONAA >60 04/14/2019 0810   GFRAA >60 04/14/2019 0810    No results found for: SPEP, UPEP  Lab Results  Component Value Date   WBC 2.7 (L) 04/14/2019   NEUTROABS 1.8 04/14/2019   HGB 10.0 (L) 04/14/2019   HCT 29.9 (L) 04/14/2019   MCV 88.7 04/14/2019   PLT 54 (L) 04/14/2019      Chemistry      Component Value Date/Time   NA 137 04/14/2019 0810   K 3.9 04/14/2019 0810   CL 104 04/14/2019 0810   CO2 23 04/14/2019 0810   BUN 18 04/14/2019 0810   CREATININE 0.94 04/14/2019 0810       Component Value Date/Time   CALCIUM 9.4 04/14/2019 0810   ALKPHOS 90 04/14/2019 0810   AST 19 04/14/2019 0810   ALT 15 04/14/2019 0810   BILITOT 0.2 (L) 04/14/2019 0810       RADIOGRAPHIC STUDIES: I have personally reviewed the radiological images as listed  below and agreed with the findings in the report. No results found.

## 2019-04-14 NOTE — Progress Notes (Signed)
No treatment due to low plt count

## 2019-04-14 NOTE — Patient Instructions (Signed)

## 2019-04-15 ENCOUNTER — Inpatient Hospital Stay: Payer: Medicaid Other

## 2019-04-16 ENCOUNTER — Inpatient Hospital Stay: Payer: Medicaid Other

## 2019-04-16 ENCOUNTER — Inpatient Hospital Stay: Payer: Medicaid Other | Admitting: Hematology

## 2019-04-17 ENCOUNTER — Inpatient Hospital Stay: Payer: Medicaid Other

## 2019-04-21 ENCOUNTER — Inpatient Hospital Stay: Payer: Medicaid Other

## 2019-04-21 ENCOUNTER — Other Ambulatory Visit: Payer: Self-pay | Admitting: *Deleted

## 2019-04-21 ENCOUNTER — Other Ambulatory Visit: Payer: Self-pay | Admitting: Hematology

## 2019-04-21 ENCOUNTER — Other Ambulatory Visit: Payer: Self-pay

## 2019-04-21 ENCOUNTER — Encounter: Payer: Self-pay | Admitting: *Deleted

## 2019-04-21 DIAGNOSIS — R918 Other nonspecific abnormal finding of lung field: Secondary | ICD-10-CM

## 2019-04-21 DIAGNOSIS — C3490 Malignant neoplasm of unspecified part of unspecified bronchus or lung: Secondary | ICD-10-CM

## 2019-04-21 DIAGNOSIS — Z5189 Encounter for other specified aftercare: Secondary | ICD-10-CM | POA: Diagnosis not present

## 2019-04-21 DIAGNOSIS — G893 Neoplasm related pain (acute) (chronic): Secondary | ICD-10-CM

## 2019-04-21 LAB — CBC WITH DIFFERENTIAL (CANCER CENTER ONLY)
Abs Immature Granulocytes: 0.03 K/uL (ref 0.00–0.07)
Basophils Absolute: 0 K/uL (ref 0.0–0.1)
Basophils Relative: 1 %
Eosinophils Absolute: 0 K/uL (ref 0.0–0.5)
Eosinophils Relative: 2 %
HCT: 29.8 % — ABNORMAL LOW (ref 36.0–46.0)
Hemoglobin: 10.1 g/dL — ABNORMAL LOW (ref 12.0–15.0)
Immature Granulocytes: 1 %
Lymphocytes Relative: 20 %
Lymphs Abs: 0.5 K/uL — ABNORMAL LOW (ref 0.7–4.0)
MCH: 30.2 pg (ref 26.0–34.0)
MCHC: 33.9 g/dL (ref 30.0–36.0)
MCV: 89.2 fL (ref 80.0–100.0)
Monocytes Absolute: 0.5 K/uL (ref 0.1–1.0)
Monocytes Relative: 19 %
Neutro Abs: 1.4 K/uL — ABNORMAL LOW (ref 1.7–7.7)
Neutrophils Relative %: 57 %
Platelet Count: 100 K/uL — ABNORMAL LOW (ref 150–400)
RBC: 3.34 MIL/uL — ABNORMAL LOW (ref 3.87–5.11)
RDW: 19.4 % — ABNORMAL HIGH (ref 11.5–15.5)
WBC Count: 2.4 K/uL — ABNORMAL LOW (ref 4.0–10.5)
nRBC: 0 % (ref 0.0–0.2)

## 2019-04-21 LAB — CMP (CANCER CENTER ONLY)
ALT: 16 U/L (ref 0–44)
AST: 17 U/L (ref 15–41)
Albumin: 3.9 g/dL (ref 3.5–5.0)
Alkaline Phosphatase: 100 U/L (ref 38–126)
Anion gap: 10 (ref 5–15)
BUN: 17 mg/dL (ref 6–20)
CO2: 25 mmol/L (ref 22–32)
Calcium: 9.7 mg/dL (ref 8.9–10.3)
Chloride: 103 mmol/L (ref 98–111)
Creatinine: 1 mg/dL (ref 0.44–1.00)
GFR, Est AFR Am: >60 mL/min
GFR, Estimated: >60 mL/min
Glucose, Bld: 117 mg/dL — ABNORMAL HIGH (ref 70–99)
Potassium: 3.6 mmol/L (ref 3.5–5.1)
Sodium: 138 mmol/L (ref 135–145)
Total Bilirubin: 0.2 mg/dL — ABNORMAL LOW (ref 0.3–1.2)
Total Protein: 7.1 g/dL (ref 6.5–8.1)

## 2019-04-21 LAB — MAGNESIUM: Magnesium: 1.6 mg/dL — ABNORMAL LOW (ref 1.7–2.4)

## 2019-04-21 MED ORDER — SODIUM CHLORIDE 0.9 % IV SOLN
Freq: Once | INTRAVENOUS | Status: AC
  Filled 2019-04-21: qty 250

## 2019-04-21 MED ORDER — POTASSIUM CHLORIDE 2 MEQ/ML IV SOLN
Freq: Once | INTRAVENOUS | Status: AC
  Filled 2019-04-21: qty 10

## 2019-04-21 MED ORDER — PALONOSETRON HCL INJECTION 0.25 MG/5ML
INTRAVENOUS | Status: AC
  Filled 2019-04-21: qty 5

## 2019-04-21 MED ORDER — HEPARIN SOD (PORK) LOCK FLUSH 100 UNIT/ML IV SOLN
500.0000 [IU] | Freq: Once | INTRAVENOUS | Status: DC | PRN
  Filled 2019-04-21: qty 5

## 2019-04-21 MED ORDER — LORAZEPAM 0.5 MG PO TABS: 0.5000 mg | ORAL_TABLET | Freq: Four times a day (QID) | ORAL | 0 refills | Status: DC | PRN

## 2019-04-21 MED ORDER — SODIUM CHLORIDE 0.9 % IV SOLN
Freq: Once | INTRAVENOUS | Status: AC
  Filled 2019-04-21: qty 5

## 2019-04-21 MED ORDER — SODIUM CHLORIDE 0.9 % IV SOLN
80.0000 mg/m2 | Freq: Once | INTRAVENOUS | Status: AC
  Administered 2019-04-21: 14:00:00 130 mg via INTRAVENOUS
  Filled 2019-04-21: qty 6.5

## 2019-04-21 MED ORDER — PALONOSETRON HCL INJECTION 0.25 MG/5ML
0.2500 mg | Freq: Once | INTRAVENOUS | Status: AC
  Administered 2019-04-21: 0.25 mg via INTRAVENOUS

## 2019-04-21 MED ORDER — SODIUM CHLORIDE 0.9 % IV SOLN
75.0000 mg/m2 | Freq: Once | INTRAVENOUS | Status: AC
  Administered 2019-04-21: 125 mg via INTRAVENOUS
  Filled 2019-04-21: qty 100

## 2019-04-21 MED ORDER — PROCHLORPERAZINE MALEATE 10 MG PO TABS: 10.0000 mg | ORAL_TABLET | Freq: Four times a day (QID) | ORAL | 1 refills | Status: AC | PRN

## 2019-04-21 MED ORDER — MORPHINE SULFATE ER 15 MG PO TBCR: 15.0000 mg | EXTENDED_RELEASE_TABLET | Freq: Two times a day (BID) | ORAL | 0 refills | Status: DC

## 2019-04-21 MED ORDER — SODIUM CHLORIDE 0.9% FLUSH
10.0000 mL | INTRAVENOUS | Status: DC | PRN
  Filled 2019-04-21: qty 10

## 2019-04-21 MED FILL — LORazepam 0.5 MG TABS: 0.5 | 7 days supply | Qty: 30 | Fill #0

## 2019-04-21 MED FILL — MORPHINE SULF ER 15 MG TAB: 15 | 30 days supply | Qty: 60 | Fill #0

## 2019-04-21 MED FILL — PROCHLORPERAZINE 10 MG TAB: 10 | 7 days supply | Qty: 30 | Fill #0

## 2019-04-21 NOTE — Patient Instructions (Signed)

## 2019-04-21 NOTE — Progress Notes (Signed)
MD aware of VS and elevated HR. Ok to proceed.with elevated vs

## 2019-04-21 NOTE — Progress Notes (Signed)
Magnesium level 1.6 today. Dr. Maylon Peppers would like patient to receive magnesium sulfate 2gm in NS 50 mL over 1 hour on 1/13 and 1/14. Orders entered in the treatment plan as discussed.  Hardie Pulley, PharmD, BCPS, BCOP

## 2019-04-21 NOTE — Patient Instructions (Signed)
[place] Discharge Instructions for Patients Receiving Chemotherapy  Today you received the following chemotherapy agents Cisplatin, VP16  To help prevent nausea and vomiting after your treatment, we encourage you to take your nausea medication    If you develop nausea and vomiting that is not controlled by your nausea medication, call the clinic.   BELOW ARE SYMPTOMS THAT SHOULD BE REPORTED IMMEDIATELY:  *FEVER GREATER THAN 100.5 F  *CHILLS WITH OR WITHOUT FEVER  NAUSEA AND VOMITING THAT IS NOT CONTROLLED WITH YOUR NAUSEA MEDICATION  *UNUSUAL SHORTNESS OF BREATH  *UNUSUAL BRUISING OR BLEEDING  TENDERNESS IN MOUTH AND THROAT WITH OR WITHOUT PRESENCE OF ULCERS  *URINARY PROBLEMS  *BOWEL PROBLEMS  UNUSUAL RASH Items with * indicate a potential emergency and should be followed up as soon as possible.  Feel free to call the clinic should you have any questions or concerns. The clinic phone number is [phone].  Please show the [place] at check-in to the Emergency Department and triage nurse.

## 2019-04-21 NOTE — Progress Notes (Signed)
CBC and CMET reviewed by MD, ok to treat despite labs

## 2019-04-21 NOTE — Progress Notes (Signed)
Reviewed all labwork with Dr. Maylon Peppers.  Ok to treat today per Dr. Maylon Peppers  400 cc urine output so far this morning. CDDP released.

## 2019-04-22 ENCOUNTER — Inpatient Hospital Stay: Payer: Medicaid Other

## 2019-04-22 ENCOUNTER — Other Ambulatory Visit: Payer: Self-pay

## 2019-04-22 VITALS — BP 158/97 | HR 105 | Temp 98.6°F | Resp 17

## 2019-04-22 DIAGNOSIS — C3490 Malignant neoplasm of unspecified part of unspecified bronchus or lung: Secondary | ICD-10-CM

## 2019-04-22 DIAGNOSIS — Z5189 Encounter for other specified aftercare: Secondary | ICD-10-CM | POA: Diagnosis not present

## 2019-04-22 MED ORDER — DEXAMETHASONE SODIUM PHOSPHATE 10 MG/ML IJ SOLN
10.0000 mg | Freq: Once | INTRAMUSCULAR | Status: AC
  Administered 2019-04-22: 10 mg via INTRAVENOUS

## 2019-04-22 MED ORDER — SODIUM CHLORIDE 0.9 % IV SOLN
Freq: Once | INTRAVENOUS | Status: AC
  Filled 2019-04-22: qty 250

## 2019-04-22 MED ORDER — SODIUM CHLORIDE 0.9 % IV SOLN
80.0000 mg/m2 | Freq: Once | INTRAVENOUS | Status: AC
  Administered 2019-04-22: 130 mg via INTRAVENOUS
  Filled 2019-04-22: qty 6.5

## 2019-04-22 MED ORDER — SODIUM CHLORIDE 0.9% FLUSH
10.0000 mL | INTRAVENOUS | Status: DC | PRN
  Administered 2019-04-22: 16:00:00 10 mL
  Filled 2019-04-22: qty 10

## 2019-04-22 MED ORDER — DEXAMETHASONE SODIUM PHOSPHATE 10 MG/ML IJ SOLN
INTRAMUSCULAR | Status: AC
  Filled 2019-04-22: qty 1

## 2019-04-22 MED ORDER — SODIUM CHLORIDE 0.9 % IV SOLN
Freq: Once | INTRAVENOUS | Status: AC
  Filled 2019-04-22: qty 50

## 2019-04-22 MED ORDER — HEPARIN SOD (PORK) LOCK FLUSH 100 UNIT/ML IV SOLN
500.0000 [IU] | Freq: Once | INTRAVENOUS | Status: AC | PRN
  Administered 2019-04-22: 500 [IU]
  Filled 2019-04-22: qty 5

## 2019-04-23 ENCOUNTER — Inpatient Hospital Stay: Payer: Medicaid Other

## 2019-04-23 VITALS — BP 171/93 | HR 88 | Temp 98.8°F | Resp 20

## 2019-04-23 DIAGNOSIS — C3490 Malignant neoplasm of unspecified part of unspecified bronchus or lung: Secondary | ICD-10-CM

## 2019-04-23 DIAGNOSIS — Z5189 Encounter for other specified aftercare: Secondary | ICD-10-CM | POA: Diagnosis not present

## 2019-04-23 MED ORDER — SODIUM CHLORIDE 0.9 % IV SOLN
80.0000 mg/m2 | Freq: Once | INTRAVENOUS | Status: AC
  Administered 2019-04-23: 130 mg via INTRAVENOUS
  Filled 2019-04-23: qty 6.5

## 2019-04-23 MED ORDER — SODIUM CHLORIDE 0.9 % IV SOLN
Freq: Once | INTRAVENOUS | Status: AC
  Filled 2019-04-23: qty 250

## 2019-04-23 MED ORDER — SODIUM CHLORIDE 0.9% FLUSH
10.0000 mL | INTRAVENOUS | Status: DC | PRN
  Administered 2019-04-23: 10 mL
  Filled 2019-04-23: qty 10

## 2019-04-23 MED ORDER — SODIUM CHLORIDE 0.9 % IV SOLN
Freq: Once | INTRAVENOUS | Status: AC
  Filled 2019-04-23: qty 50

## 2019-04-23 MED ORDER — DEXAMETHASONE SODIUM PHOSPHATE 10 MG/ML IJ SOLN
10.0000 mg | Freq: Once | INTRAMUSCULAR | Status: AC
  Administered 2019-04-23: 10 mg via INTRAVENOUS

## 2019-04-23 MED ORDER — DEXAMETHASONE SODIUM PHOSPHATE 10 MG/ML IJ SOLN
INTRAMUSCULAR | Status: AC
  Filled 2019-04-23: qty 1

## 2019-04-23 MED ORDER — HEPARIN SOD (PORK) LOCK FLUSH 100 UNIT/ML IV SOLN
500.0000 [IU] | Freq: Once | INTRAVENOUS | Status: AC | PRN
  Administered 2019-04-23: 500 [IU]
  Filled 2019-04-23: qty 5

## 2019-04-23 NOTE — Patient Instructions (Signed)
Magnesium Sulfate injection What is this medicine? MAGNESIUM SULFATE (mag NEE zee um SUL fate) is an electrolyte injection commonly used to treat low magnesium levels in your blood. It is also used to prevent or control seizures in women with preeclampsia or eclampsia. This medicine may be used for other purposes; ask your health care provider or pharmacist if you have questions. What should I tell my health care provider before I take this medicine? They need to know if you have any of these conditions:  heart disease  history of irregular heart beat  kidney disease  an unusual or allergic reaction to magnesium sulfate, medicines, foods, dyes, or preservatives  pregnant or trying to get pregnant  breast-feeding How should I use this medicine? This medicine is for infusion into a vein. It is given by a health care professional in a hospital or clinic setting. Talk to your pediatrician regarding the use of this medicine in children. While this drug may be prescribed for selected conditions, precautions do apply. Overdosage: If you think you have taken too much of this medicine contact a poison control center or emergency room at once. NOTE: This medicine is only for you. Do not share this medicine with others. What if I miss a dose? This does not apply. What may interact with this medicine? This medicine may interact with the following medications:  certain medicines for anxiety or sleep  certain medicines for seizures like phenobarbital  digoxin  medicines that relax muscles for surgery  narcotic medicines for pain This list may not describe all possible interactions. Give your health care provider a list of all the medicines, herbs, non-prescription drugs, or dietary supplements you use. Also tell them if you smoke, drink alcohol, or use illegal drugs. Some items may interact with your medicine. What should I watch for while using this medicine? Your condition will be  monitored carefully while you are receiving this medicine. You may need blood work done while you are receiving this medicine. What side effects may I notice from receiving this medicine? Side effects that you should report to your doctor or health care professional as soon as possible:  allergic reactions like skin rash, itching or hives, swelling of the face, lips, or tongue  facial flushing  muscle weakness  signs and symptoms of low blood pressure like dizziness; feeling faint or lightheaded, falls; unusually weak or tired  signs and symptoms of a dangerous change in heartbeat or heart rhythm like chest pain; dizziness; fast or irregular heartbeat; palpitations; breathing problems  sweating This list may not describe all possible side effects. Call your doctor for medical advice about side effects. You may report side effects to FDA at 1-800-FDA-1088. Where should I keep my medicine? This drug is given in a hospital or clinic and will not be stored at home. NOTE: This sheet is a summary. It may not cover all possible information. If you have questions about this medicine, talk to your doctor, pharmacist, or health care provider.  2020 Elsevier/Gold Standard (2015-10-12 12:31:42) Etoposide, VP-16 injection What is this medicine? ETOPOSIDE, VP-16 (e toe POE side) is a chemotherapy drug. It is used to treat testicular cancer, lung cancer, and other cancers. This medicine may be used for other purposes; ask your health care provider or pharmacist if you have questions. COMMON BRAND NAME(S): Etopophos, Toposar, VePesid What should I tell my health care provider before I take this medicine? They need to know if you have any of these conditions:  infection  kidney disease  liver disease  low blood counts, like low white cell, platelet, or red cell counts  an unusual or allergic reaction to etoposide, other medicines, foods, dyes, or preservatives  pregnant or trying to get  pregnant  breast-feeding How should I use this medicine? This medicine is for infusion into a vein. It is administered in a hospital or clinic by a specially trained health care professional. Talk to your pediatrician regarding the use of this medicine in children. Special care may be needed. Overdosage: If you think you have taken too much of this medicine contact a poison control center or emergency room at once. NOTE: This medicine is only for you. Do not share this medicine with others. What if I miss a dose? It is important not to miss your dose. Call your doctor or health care professional if you are unable to keep an appointment. What may interact with this medicine? This medicine may interact with the following medications:  warfarin This list may not describe all possible interactions. Give your health care provider a list of all the medicines, herbs, non-prescription drugs, or dietary supplements you use. Also tell them if you smoke, drink alcohol, or use illegal drugs. Some items may interact with your medicine. What should I watch for while using this medicine? Visit your doctor for checks on your progress. This drug may make you feel generally unwell. This is not uncommon, as chemotherapy can affect healthy cells as well as cancer cells. Report any side effects. Continue your course of treatment even though you feel ill unless your doctor tells you to stop. In some cases, you may be given additional medicines to help with side effects. Follow all directions for their use. Call your doctor or health care professional for advice if you get a fever, chills or sore throat, or other symptoms of a cold or flu. Do not treat yourself. This drug decreases your body's ability to fight infections. Try to avoid being around people who are sick. This medicine may increase your risk to bruise or bleed. Call your doctor or health care professional if you notice any unusual bleeding. Talk to your  doctor about your risk of cancer. You may be more at risk for certain types of cancers if you take this medicine. Do not become pregnant while taking this medicine or for at least 6 months after stopping it. Women should inform their doctor if they wish to become pregnant or think they might be pregnant. Women of child-bearing potential will need to have a negative pregnancy test before starting this medicine. There is a potential for serious side effects to an unborn child. Talk to your health care professional or pharmacist for more information. Do not breast-feed an infant while taking this medicine. Men must use a latex condom during sexual contact with a woman while taking this medicine and for at least 4 months after stopping it. A latex condom is needed even if you have had a vasectomy. Contact your doctor right away if your partner becomes pregnant. Do not donate sperm while taking this medicine and for at least 4 months after you stop taking this medicine. Men should inform their doctors if they wish to father a child. This medicine may lower sperm counts. What side effects may I notice from receiving this medicine? Side effects that you should report to your doctor or health care professional as soon as possible:  allergic reactions like skin rash, itching or hives, swelling of the face,  lips, or tongue  low blood counts - this medicine may decrease the number of white blood cells, red blood cells, and platelets. You may be at increased risk for infections and bleeding  nausea, vomiting  redness, blistering, peeling or loosening of the skin, including inside the mouth  signs and symptoms of infection like fever; chills; cough; sore throat; pain or trouble passing urine  signs and symptoms of low red blood cells or anemia such as unusually weak or tired; feeling faint or lightheaded; falls; breathing problems  unusual bruising or bleeding Side effects that usually do not require medical  attention (report to your doctor or health care professional if they continue or are bothersome):  changes in taste  diarrhea  hair loss  loss of appetite  mouth sores This list may not describe all possible side effects. Call your doctor for medical advice about side effects. You may report side effects to FDA at 1-800-FDA-1088. Where should I keep my medicine? This drug is given in a hospital or clinic and will not be stored at home. NOTE: This sheet is a summary. It may not cover all possible information. If you have questions about this medicine, talk to your doctor, pharmacist, or health care provider.  2020 Elsevier/Gold Standard (2018-05-21 16:57:15)

## 2019-04-24 ENCOUNTER — Other Ambulatory Visit: Payer: Self-pay

## 2019-04-24 ENCOUNTER — Encounter: Payer: Self-pay | Admitting: Pharmacy Technician

## 2019-04-24 ENCOUNTER — Inpatient Hospital Stay: Payer: Medicaid Other

## 2019-04-24 DIAGNOSIS — C3490 Malignant neoplasm of unspecified part of unspecified bronchus or lung: Secondary | ICD-10-CM

## 2019-04-24 DIAGNOSIS — Z5189 Encounter for other specified aftercare: Secondary | ICD-10-CM | POA: Diagnosis not present

## 2019-04-24 MED ORDER — PEGFILGRASTIM-CBQV 6 MG/0.6ML ~~LOC~~ SOSY
6.0000 mg | PREFILLED_SYRINGE | Freq: Once | SUBCUTANEOUS | Status: AC
  Administered 2019-04-24: 6 mg via SUBCUTANEOUS

## 2019-04-24 MED ORDER — PEGFILGRASTIM-CBQV 6 MG/0.6ML ~~LOC~~ SOSY
PREFILLED_SYRINGE | SUBCUTANEOUS | Status: AC
  Filled 2019-04-24: qty 0.6

## 2019-04-24 NOTE — Progress Notes (Signed)
Patient has been approved for drug assistance by [employer] for [employer]. The enrollment period is from 03/27/19-04/08/20 based on self pay. First DOS covered is 03/27/19.

## 2019-04-28 ENCOUNTER — Encounter: Payer: Self-pay | Admitting: *Deleted

## 2019-05-01 ENCOUNTER — Encounter: Payer: Self-pay | Admitting: *Deleted

## 2019-05-01 MED ORDER — GENERIC EXTERNAL MEDICATION
1.00 | Status: DC
Start: 2019-05-01 — End: 2019-05-01

## 2019-05-01 MED ORDER — MAGNESIUM SULFATE 2 GM/50ML IV SOLN
2.00 | INTRAVENOUS | Status: DC
Start: 2019-05-01 — End: 2019-05-01

## 2019-05-01 MED ORDER — OYSTER SHELL CALCIUM/D 500-200 MG-UNIT PO TABS
1.00 | ORAL_TABLET | ORAL | Status: DC
Start: 2019-05-09 — End: 2019-05-01

## 2019-05-01 MED ORDER — ONDANSETRON HCL 4 MG/2ML IJ SOLN
4.00 | INTRAMUSCULAR | Status: DC
Start: ? — End: 2019-05-01

## 2019-05-01 MED ORDER — ACETAMINOPHEN 325 MG PO TABS
650.00 | ORAL_TABLET | ORAL | Status: DC
Start: ? — End: 2019-05-01

## 2019-05-01 MED ORDER — ALBUTEROL SULFATE HFA 108 (90 BASE) MCG/ACT IN AERS
2.00 | INHALATION_SPRAY | RESPIRATORY_TRACT | Status: DC
Start: 2019-05-08 — End: 2019-05-01

## 2019-05-01 MED ORDER — APIXABAN 5 MG PO TABS
5.00 | ORAL_TABLET | ORAL | Status: DC
Start: 2019-05-08 — End: 2019-05-01

## 2019-05-01 MED ORDER — MORPHINE SULFATE ER 15 MG PO TBCR
15.00 | EXTENDED_RELEASE_TABLET | ORAL | Status: DC
Start: 2019-05-08 — End: 2019-05-01

## 2019-05-01 MED ORDER — MAGNESIUM OXIDE 400 MG PO TABS
400.00 | ORAL_TABLET | ORAL | Status: DC
Start: 2019-05-09 — End: 2019-05-01

## 2019-05-01 MED ORDER — GENERIC EXTERNAL MEDICATION
2.00 | Status: DC
Start: 2019-05-01 — End: 2019-05-01

## 2019-05-01 MED ORDER — HYDROCODONE-ACETAMINOPHEN 5-325 MG PO TABS
1.00 | ORAL_TABLET | ORAL | Status: DC
Start: ? — End: 2019-05-01

## 2019-05-01 MED ORDER — DIPHENOXYLATE-ATROPINE 2.5-0.025 MG PO TABS
2.00 | ORAL_TABLET | ORAL | Status: DC
Start: ? — End: 2019-05-01

## 2019-05-01 MED ORDER — LORAZEPAM 0.5 MG PO TABS
0.50 | ORAL_TABLET | ORAL | Status: DC
Start: ? — End: 2019-05-01

## 2019-05-06 MED ORDER — LEVOFLOXACIN IN D5W 750 MG/150ML IV SOLN
750.00 | INTRAVENOUS | Status: DC
Start: 2019-05-08 — End: 2019-05-06

## 2019-05-06 MED ORDER — DERMACERIN EX CREA
TOPICAL_CREAM | CUTANEOUS | Status: DC
Start: ? — End: 2019-05-06

## 2019-05-06 MED ORDER — BUTALBITAL-APAP-CAFFEINE 50-325-40 MG PO TABS
1.00 | ORAL_TABLET | ORAL | Status: DC
Start: ? — End: 2019-05-06

## 2019-05-08 MED ORDER — SODIUM CHLORIDE 0.9 % IV SOLN
250.00 | INTRAVENOUS | Status: DC
Start: ? — End: 2019-05-08

## 2019-05-11 ENCOUNTER — Inpatient Hospital Stay: Payer: Medicaid Other

## 2019-05-11 ENCOUNTER — Encounter: Payer: Self-pay | Admitting: *Deleted

## 2019-05-11 ENCOUNTER — Encounter: Payer: Self-pay | Admitting: Hematology

## 2019-05-11 ENCOUNTER — Other Ambulatory Visit: Payer: Self-pay | Admitting: *Deleted

## 2019-05-11 ENCOUNTER — Inpatient Hospital Stay: Payer: Medicaid Other | Attending: Hematology

## 2019-05-11 ENCOUNTER — Inpatient Hospital Stay (HOSPITAL_BASED_OUTPATIENT_CLINIC_OR_DEPARTMENT_OTHER): Payer: Medicaid Other | Admitting: Hematology

## 2019-05-11 ENCOUNTER — Other Ambulatory Visit: Payer: Self-pay

## 2019-05-11 VITALS — BP 146/99 | HR 101 | Temp 97.8°F | Resp 18 | Ht 60.0 in | Wt 142.0 lb

## 2019-05-11 DIAGNOSIS — Z5111 Encounter for antineoplastic chemotherapy: Secondary | ICD-10-CM | POA: Insufficient documentation

## 2019-05-11 DIAGNOSIS — C3490 Malignant neoplasm of unspecified part of unspecified bronchus or lung: Secondary | ICD-10-CM

## 2019-05-11 DIAGNOSIS — D6481 Anemia due to antineoplastic chemotherapy: Secondary | ICD-10-CM

## 2019-05-11 DIAGNOSIS — D61818 Other pancytopenia: Secondary | ICD-10-CM | POA: Diagnosis not present

## 2019-05-11 DIAGNOSIS — Z7901 Long term (current) use of anticoagulants: Secondary | ICD-10-CM | POA: Diagnosis not present

## 2019-05-11 DIAGNOSIS — D6959 Other secondary thrombocytopenia: Secondary | ICD-10-CM

## 2019-05-11 DIAGNOSIS — Z86718 Personal history of other venous thrombosis and embolism: Secondary | ICD-10-CM | POA: Diagnosis not present

## 2019-05-11 DIAGNOSIS — Z79899 Other long term (current) drug therapy: Secondary | ICD-10-CM | POA: Diagnosis not present

## 2019-05-11 DIAGNOSIS — R110 Nausea: Secondary | ICD-10-CM | POA: Diagnosis not present

## 2019-05-11 DIAGNOSIS — Z7952 Long term (current) use of systemic steroids: Secondary | ICD-10-CM

## 2019-05-11 DIAGNOSIS — J439 Emphysema, unspecified: Secondary | ICD-10-CM | POA: Insufficient documentation

## 2019-05-11 DIAGNOSIS — Z5189 Encounter for other specified aftercare: Secondary | ICD-10-CM | POA: Diagnosis present

## 2019-05-11 DIAGNOSIS — C3411 Malignant neoplasm of upper lobe, right bronchus or lung: Secondary | ICD-10-CM | POA: Diagnosis not present

## 2019-05-11 DIAGNOSIS — Z791 Long term (current) use of non-steroidal anti-inflammatories (NSAID): Secondary | ICD-10-CM | POA: Diagnosis not present

## 2019-05-11 DIAGNOSIS — E860 Dehydration: Secondary | ICD-10-CM

## 2019-05-11 DIAGNOSIS — T451X5A Adverse effect of antineoplastic and immunosuppressive drugs, initial encounter: Secondary | ICD-10-CM | POA: Insufficient documentation

## 2019-05-11 DIAGNOSIS — G893 Neoplasm related pain (acute) (chronic): Secondary | ICD-10-CM

## 2019-05-11 DIAGNOSIS — D701 Agranulocytosis secondary to cancer chemotherapy: Secondary | ICD-10-CM | POA: Diagnosis not present

## 2019-05-11 DIAGNOSIS — K209 Esophagitis, unspecified without bleeding: Secondary | ICD-10-CM | POA: Insufficient documentation

## 2019-05-11 DIAGNOSIS — Z7982 Long term (current) use of aspirin: Secondary | ICD-10-CM

## 2019-05-11 LAB — CMP (CANCER CENTER ONLY)
ALT: 14 U/L (ref 0–44)
AST: 15 U/L (ref 15–41)
Albumin: 4 g/dL (ref 3.5–5.0)
Alkaline Phosphatase: 114 U/L (ref 38–126)
Anion gap: 10 (ref 5–15)
BUN: 15 mg/dL (ref 6–20)
CO2: 22 mmol/L (ref 22–32)
Calcium: 9.4 mg/dL (ref 8.9–10.3)
Chloride: 104 mmol/L (ref 98–111)
Creatinine: 0.79 mg/dL (ref 0.44–1.00)
GFR, Est AFR Am: >60 mL/min (ref >60)
GFR, Estimated: >60 mL/min (ref >60)
Glucose, Bld: 88 mg/dL (ref 70–99)
Potassium: 4.5 mmol/L (ref 3.5–5.1)
Sodium: 136 mmol/L (ref 135–145)
Total Bilirubin: 0.3 mg/dL (ref 0.3–1.2)
Total Protein: 7.2 g/dL (ref 6.5–8.1)

## 2019-05-11 LAB — CBC WITH DIFFERENTIAL (CANCER CENTER ONLY)
Abs Immature Granulocytes: 0.18 10*3/uL — ABNORMAL HIGH (ref 0.00–0.07)
Basophils Absolute: 0 10*3/uL (ref 0.0–0.1)
Basophils Relative: 0 %
Eosinophils Absolute: 0.1 10*3/uL (ref 0.0–0.5)
Eosinophils Relative: 3 %
HCT: 23.4 % — ABNORMAL LOW (ref 36.0–46.0)
Hemoglobin: 8 g/dL — ABNORMAL LOW (ref 12.0–15.0)
Immature Granulocytes: 4 %
Lymphocytes Relative: 14 %
Lymphs Abs: 0.6 10*3/uL — ABNORMAL LOW (ref 0.7–4.0)
MCH: 30.8 pg (ref 26.0–34.0)
MCHC: 34.2 g/dL (ref 30.0–36.0)
MCV: 90 fL (ref 80.0–100.0)
Monocytes Absolute: 0.8 10*3/uL (ref 0.1–1.0)
Monocytes Relative: 20 %
Neutro Abs: 2.4 10*3/uL (ref 1.7–7.7)
Neutrophils Relative %: 59 %
Platelet Count: 33 10*3/uL — ABNORMAL LOW (ref 150–400)
RBC: 2.6 MIL/uL — ABNORMAL LOW (ref 3.87–5.11)
RDW: 19.9 % — ABNORMAL HIGH (ref 11.5–15.5)
WBC Count: 4.1 10*3/uL (ref 4.0–10.5)
nRBC: 0.5 % — ABNORMAL HIGH (ref 0.0–0.2)

## 2019-05-11 MED ORDER — SODIUM CHLORIDE 0.9% FLUSH
10.0000 mL | Freq: Once | INTRAVENOUS | Status: AC | PRN
  Administered 2019-05-11: 10 mL
  Filled 2019-05-11: qty 10

## 2019-05-11 MED ORDER — SODIUM CHLORIDE 0.9 % IV SOLN
Freq: Once | INTRAVENOUS | Status: AC
  Filled 2019-05-11: qty 250

## 2019-05-11 MED ORDER — ONDANSETRON HCL 8 MG PO TABS: 8.0000 mg | ORAL_TABLET | Freq: Two times a day (BID) | ORAL | 2 refills | Status: AC | PRN

## 2019-05-11 MED ORDER — HEPARIN SOD (PORK) LOCK FLUSH 100 UNIT/ML IV SOLN
500.0000 [IU] | Freq: Once | INTRAVENOUS | Status: AC | PRN
  Administered 2019-05-11: 500 [IU]
  Filled 2019-05-11: qty 5

## 2019-05-11 MED FILL — ONDANSETRON HCL 8 MG TABLET: 8 | 30 days supply | Qty: 30 | Fill #0

## 2019-05-11 NOTE — Patient Instructions (Signed)

## 2019-05-11 NOTE — Progress Notes (Signed)
[place] OFFICE PROGRESS NOTE  Patient Care Team: Patient, No Pcp Per as PCP - General (General Practice) [name], MD as Medical Oncologist (Oncology) Cordelia Poche, RN as Oncology Nurse Navigator  HEME/ONC OVERVIEW: 1. Limited stage small cell lung cancer, Stage IIIA ([id number]) -01/2019:   Large RUL mass w/ thoracic adenopathy and a second mass/consolidation in the RUL periphery on CTA chest  FDG-avid RUL mass with suspected mediastinal invasion, R paratracheal adenopathy; indeterminate consolidation in R apex (post-obstructive pneumonitis vs. satellite lesion in the same lobe)  MRI brain negative for malignancy -02/2019: bronchoscopy w/ biopsy of the RUL mass and 7R LN, path showed small cell lung cancer  -02/2019 - present: concurrent chemoRT with cisplatin/etoposide   2. 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 Port placement in 02/2019   TREATMENT REGIMEN:  02/24/2019 - present: concurrent chemoradiation with cisplatin/etoposide, plan for 4 cycles; RT started on 03/17/2019  03/2019 - present: Eliquis '5mg'$  BID   ASSESSMENT & PLAN:   Limited stage small cell lung cancer, Stage IIIA ([id number]) -S/p 3 cyclse of cisplatin/etoposide; treatment complicated by severe pancytopenia and neutropenic fever x 2   G-CSF added with Cycle 2 of chemotherapy  Etoposide dose reduced to '80mg'$ /m2 with Cycle 3 of chemotherapy -Labs reviewed and notable for persistent thrombocytopenia -Treatment parameters not met today; I will delay chemotherapy by one week  -Due to the persistent, severe cytopenias, I will reduce cisplatin dose from 75 to '60mg'$ /m2 with Cycle 4 of the treatment -In addition, I will add levofloxacin '500mg'$  daily for anti-bacterial prophylaxis during the last cycle of chemotherapy due to recurrent neutropenic fever  -See the management of chemotherapy toxicities below -Plan for 4 cycles of chemotherapy, concurrent with  radiation  -Once the patient completes the treatment, we will plan to repeat scans in 3-4 weeks to assess treatment response  -PRN anti-emetics: Zofran, Compazine, dexamethasone and Ativan   Chemotherapy-associated anemia -Secondary to chemotherapy -Hgb 8.0 today, stable since the recent discharge  -Patient denies any symptom of bleeding -Cisplatin dose reduction as above   Chemotherapy-associated thrombocytopenia -Secondary to chemotherapy -Plts 33k, slightly improving since the recent discharge  -Patient denies any symptoms of bleeding or excess bruising, such as epistaxis, hematochezia, melena, or hematuria -Dose reduction of etoposide and cisplatin as outlined above   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 -On Eliquis since early 03/2019; on hold since recent hospitalization due to severe thrombocytopenia -As platelet count is < 50k, we will continue to hold Eliquis for now  -When plts > 50k, we can resume anticoagulation   Cancer-related pain -Likely due to pleural invasion by one of the RUL lung masses -Currently on MS-Contin '15mg'$  BID and Norco 7.5/325 q8hrs PRN; excess sedation with IR morphine -Pain reasonably controlled -Continue the current regimen for now   Chemotherapy-associated nausea  -Secondary to chemotherapy -Symptoms relatively well controlled  -Continue PRN-anti-emetics   No orders of the defined types were placed in this encounter.  The total time spent in the appointment was 45 minutes encounter with patients including review of chart and various tests results, discussions about plan of care and coordination of care plan.  All questions were answered. The patient knows to call the clinic with any problems, questions or concerns. No barriers to learning was detected.  Return in 1 week for labs, port flush and clinic appt.   [name], MD 2/1/202110:26 AM  CHIEF COMPLAINT: "I am doing okay"  INTERVAL  HISTORY: Marcia King returns clinic for follow-up of  limited stage small cell lung cancer on definitive chemoradiation.  Patient was recently discharged from [facility] after being admitted for recurrent neutropenic fever.  Infectious work-up was unremarkable, and patient received several units of platelets due to severe thrombocytopenia.  Patient reports that since discharge, she has not had any recurrent fever.  She does have periodic nausea, for which she takes anti-emetics with improvement.  She is eating and drinking without any difficulty.  She denies any symptoms of infection.  REVIEW OF SYSTEMS:   Constitutional: ( - ) fevers, ( - )  chills , ( - ) night sweats Eyes: ( - ) blurriness of vision, ( - ) double vision, ( - ) watery eyes Ears, nose, mouth, throat, and face: ( - ) mucositis, ( - ) sore throat Respiratory: ( - ) cough, ( - ) dyspnea, ( - ) wheezes Cardiovascular: ( - ) palpitation, ( - ) chest discomfort, ( - ) lower extremity swelling Gastrointestinal:  ( + ) nausea, ( - ) heartburn, ( - ) change in bowel habits Skin: ( - ) abnormal skin rashes Lymphatics: ( - ) new lymphadenopathy, ( - ) easy bruising Neurological: ( - ) numbness, ( - ) tingling, ( - ) new weaknesses Behavioral/Psych: ( - ) mood change, ( - ) new changes  All other systems were reviewed with the patient and are negative.  SUMMARY OF ONCOLOGIC HISTORY: Oncology History  Small cell lung cancer ([place])  01/28/2019 Initial Diagnosis   Lung cancer ([place])   01/28/2019 Imaging   CTA chest: IMPRESSION: 1. No demonstrable pulmonary embolus. No thoracic aortic aneurysm or dissection.   2. Mass arising in the right upper lobe extending into the right hilum measuring 3.1 x 2.5 cm. Questionable second mass in the right upper lobe more peripherally and anteriorly measuring 2.4 x 1.7 cm versus consolidation in this area. There is adenopathy in the right hilum and right paratracheal regions. Neoplastic involvement is felt to be present. It may be  prudent to correlate with nuclear medicine PET study to further characterize.   3. Underlying emphysematous change with areas of fibrosis throughout the periphery of each lung. Fibrosis is somewhat evenly distributed between upper and lower lobe regions. There is atelectatic change in the lung bases, more on the right than on the left.   Emphysema (ICD10-J43.9).   02/06/2019 Imaging   PET:   IMPRESSION: Right upper lobe perihilar lung mass is intensely hypermetabolic compatible with primary bronchogenic carcinoma. Suspect mediastinal invasion with hypermetabolic right paratracheal adenopathy. Assuming a non-small cell neoplasm this is favored to represent a T4N1M0 lesion or stage IIIa disease.   Indeterminate nodular area of subpleural consolidation in with thin the lateral right apex has an SUV max of 4.96. This may represent an area of postobstructive pneumonitis versus satellite lesion in same lobe.   Aortic Atherosclerosis (ICD10-I70.0) and Emphysema (ICD10-J43.9).   02/07/2019 Imaging   MRI brain: IMPRESSION: 1. Normal MRI appearance the brain. No evidence for metastatic disease to the brain. 2. Left mastoid effusion without obstructing nasopharyngeal lesion.   02/12/2019 Pathology Results   Specimen Submitted:  A. LUNG, RIGHT UPPER LOBE, LAVAGE:    FINAL MICROSCOPIC DIAGNOSIS:  - Atypical cells present  - See comment   SPECIMEN ADEQUACY:  Satisfactory for evaluation   DIAGNOSTIC COMMENTS:  Mixed acute and chronic inflammation is present.  There are rare atypical cells present on the heme slides only.  These  are not seen on the cytology smears.    02/12/2019 Pathology Results   FINAL MICROSCOPIC DIAGNOSIS:   A.   MASS, RIGHT HILAR, FINE NEEDLE ASPIRATION:  -  Malignant cells consistent with small cell carcinoma  -  See comment   B. LYMPH NODE, 7 NODE, FINE NEEDLE ASPIRATION:  -  No malignant cells identified   COMMENT:   A.  Immunohistochemistry  performed on the cell block shows the  neoplastic cells are positive for TTF-1, CD56, and synaptophysin but  negative for chromogranin, cytokeratin 5 6 and p40.  Overall, the  combined morphology and immunophenotype are consistent with small cell  carcinoma.    02/18/2019 Cancer Staging   Staging form: Lung, AJCC 8th Edition - Clinical: Stage IIIA (cT4, cN1, cM0) - Signed by [name], MD on 02/18/2019   02/24/2019 -  Chemotherapy   The patient had palonosetron (ALOXI) injection 0.25 mg, 0.25 mg, Intravenous,  Once, 3 of 4 cycles Administration: 0.25 mg (02/24/2019), 0.25 mg (03/24/2019), 0.25 mg (04/21/2019) pegfilgrastim-cbqv (UDENYCA) injection 6 mg, 6 mg, Subcutaneous, Once, 2 of 3 cycles Administration: 6 mg (03/27/2019), 6 mg (04/24/2019) CISplatin (PLATINOL) 125 mg in sodium chloride 0.9 % 500 mL chemo infusion, 75 mg/m2 = 125 mg (100 % of original dose 75 mg/m2), Intravenous,  Once, 3 of 4 cycles Dose modification: 75 mg/m2 (original dose 75 mg/m2, Cycle 1, Reason: Provider Judgment), 60 mg/m2 (original dose 75 mg/m2, Cycle 4, Reason: Dose not tolerated) Administration: 125 mg (02/24/2019), 125 mg (03/24/2019), 125 mg (04/21/2019) etoposide (VEPESID) 170 mg in sodium chloride 0.9 % 500 mL chemo infusion, 100 mg/m2 = 170 mg, Intravenous,  Once, 3 of 4 cycles Dose modification: 80 mg/m2 (original dose 100 mg/m2, Cycle 2, Reason: Dose not tolerated), 100 mg/m2 (original dose 100 mg/m2, Cycle 2, Reason: Provider Judgment), 80 mg/m2 (original dose 100 mg/m2, Cycle 3, Reason: Treatment Parameters Not Met) Administration: 170 mg (02/24/2019), 170 mg (02/25/2019), 170 mg (02/26/2019), 170 mg (03/24/2019), 170 mg (03/25/2019), 170 mg (03/26/2019), 130 mg (04/21/2019), 130 mg (04/22/2019), 130 mg (04/23/2019) fosaprepitant (EMEND) 150 mg, dexamethasone (DECADRON) 12 mg in sodium chloride 0.9 % 145 mL IVPB, , Intravenous,  Once, 3 of 4 cycles Administration:  (02/24/2019),  (03/24/2019),  (04/21/2019)   for chemotherapy treatment.      I have reviewed the past medical history, past surgical history, social history and family history with the patient and they are unchanged from previous note.  ALLERGIES:  has No Known Allergies.  MEDICATIONS:  Current Outpatient Medications  Medication Sig Dispense Refill  . albuterol (PROVENTIL HFA;VENTOLIN HFA) 108 (90 Base) MCG/ACT inhaler Inhale 1-2 puffs into the lungs every 4 (four) hours as needed for wheezing or shortness of breath.     Marland Kitchen apixaban (ELIQUIS) 5 MG TABS tablet Take 1 tablet (5 mg total) by mouth 2 (two) times daily. 60 tablet 11  . aspirin EC 325 MG tablet Take by mouth.    . calcium-vitamin D (OSCAL WITH D) 500-200 MG-UNIT TABS tablet Take 1 tablet by mouth every morning.    Marland Kitchen dexamethasone (DECADRON) 4 MG tablet Take 2 tablets two times a day for 1 day on day 4 after cisplatin chemotherapy. Take with food. 30 tablet 1  . diphenoxylate-atropine (LOMOTIL) 2.5-0.025 MG tablet Take 2 tablets by mouth 4 (four) times daily as needed for diarrhea or loose stools. 30 tablet 0  . HYDROcodone-acetaminophen (NORCO) 7.5-325 MG tablet Take 1 tablet by mouth every 8 (eight) hours as needed  for moderate pain. 60 tablet 0  . ibuprofen (ADVIL) 800 MG tablet Take 1 tablet (800 mg total) by mouth 3 (three) times daily. (Patient taking differently: Take 800 mg by mouth every 8 (eight) hours as needed (pain). ) 21 tablet 0  . lidocaine-prilocaine (EMLA) cream Apply to affected area once 30 g 3  . LORazepam (ATIVAN) 0.5 MG tablet Take 1 tablet (0.5 mg total) by mouth every 6 (six) hours as needed (Nausea or vomiting). 30 tablet 0  . magnesium oxide (MAG-OX) 400 MG tablet Take 1 tablet by mouth daily.    Marland Kitchen morphine (MS CONTIN) 15 MG 12 hr tablet Take 1 tablet (15 mg total) by mouth every 12 (twelve) hours. 60 tablet 0  . nystatin (MYCOSTATIN) 100000 UNIT/ML suspension Take 5 mLs (500,000 Units total) by mouth 4 (four) times daily. Swish and swallow 60 mL 2   . pantoprazole (PROTONIX) 40 MG tablet Take 1 tablet (40 mg total) by mouth daily. 30 tablet 3  . prochlorperazine (COMPAZINE) 10 MG tablet Take 1 tablet (10 mg total) by mouth every 6 (six) hours as needed (Nausea or vomiting). 30 tablet 1  . sucralfate (CARAFATE) 1 GM/10ML suspension Take 10 mLs (1 g total) by mouth 4 (four) times daily -  with meals and at bedtime. 420 mL 0  . ondansetron (ZOFRAN) 8 MG tablet Take 1 tablet (8 mg total) by mouth 2 (two) times daily as needed. Start on the third day after cisplatin chemotherapy. 30 tablet 2   No current facility-administered medications for this visit.   Facility-Administered Medications Ordered in Other Visits  Medication Dose Route Frequency Provider Last Rate Last Admin  . heparin lock flush 100 unit/mL  500 Units Intracatheter Once PRN [place], Holli Humbles, NP      . sodium chloride flush (NS) 0.9 % injection 10 mL  10 mL Intracatheter Once PRN [place], Holli Humbles, NP        PHYSICAL EXAMINATION: ECOG PERFORMANCE STATUS: 1 - Symptomatic but completely ambulatory  Today's Vitals   05/11/19 1000  BP: (!) 146/99  Pulse: (!) 101  Resp: 18  Temp: 97.8 F (36.6 C)  TempSrc: Temporal  SpO2: 99%  Weight: 142 lb (64.4 kg)  Height: 5' (1.524 m)  PainSc: 0-No pain   Body mass index is 27.73 kg/m.  Filed Weights   05/11/19 1000  Weight: 142 lb (64.4 kg)    GENERAL: alert, no distress and comfortable SKIN: skin color, texture, turgor are normal, no rashes or significant lesions EYES: conjunctiva are pink and non-injected, sclera clear OROPHARYNX: no exudate, no erythema; lips, buccal mucosa, and tongue normal  NECK: supple, non-tender LUNGS: clear to auscultation with normal breathing effort HEART: regular rate & rhythm and no murmurs and no lower extremity edema ABDOMEN: soft, non-tender, non-distended, normal bowel sounds Musculoskeletal: no cyanosis of digits and no clubbing  PSYCH: alert & oriented x 3, fluent  speech  LABORATORY DATA:  I have reviewed the data as listed    Component Value Date/Time   NA 136 05/11/2019 0912   K 4.5 05/11/2019 0912   CL 104 05/11/2019 0912   CO2 22 05/11/2019 0912   GLUCOSE 88 05/11/2019 0912   BUN 15 05/11/2019 0912   CREATININE 0.79 05/11/2019 0912   CALCIUM 9.4 05/11/2019 0912   PROT 7.2 05/11/2019 0912   ALBUMIN 4.0 05/11/2019 0912   AST 15 05/11/2019 0912   ALT 14 05/11/2019 0912   ALKPHOS 114 05/11/2019 0912   BILITOT 0.3  05/11/2019 0912   GFRNONAA >60 05/11/2019 0912   GFRAA >60 05/11/2019 0912    No results found for: SPEP, UPEP  Lab Results  Component Value Date   WBC 4.1 05/11/2019   NEUTROABS 2.4 05/11/2019   HGB 8.0 (L) 05/11/2019   HCT 23.4 (L) 05/11/2019   MCV 90.0 05/11/2019   PLT 33 (L) 05/11/2019      Chemistry      Component Value Date/Time   NA 136 05/11/2019 0912   K 4.5 05/11/2019 0912   CL 104 05/11/2019 0912   CO2 22 05/11/2019 0912   BUN 15 05/11/2019 0912   CREATININE 0.79 05/11/2019 0912      Component Value Date/Time   CALCIUM 9.4 05/11/2019 0912   ALKPHOS 114 05/11/2019 0912   AST 15 05/11/2019 0912   ALT 14 05/11/2019 0912   BILITOT 0.3 05/11/2019 0912       RADIOGRAPHIC STUDIES: I have personally reviewed the radiological images as listed below and agreed with the findings in the report. No results found.

## 2019-05-11 NOTE — Patient Instructions (Signed)

## 2019-05-12 ENCOUNTER — Inpatient Hospital Stay: Payer: Medicaid Other

## 2019-05-12 ENCOUNTER — Inpatient Hospital Stay: Payer: Medicaid Other | Admitting: Hematology

## 2019-05-13 ENCOUNTER — Inpatient Hospital Stay: Payer: Medicaid Other

## 2019-05-14 ENCOUNTER — Inpatient Hospital Stay: Payer: Medicaid Other

## 2019-05-15 ENCOUNTER — Ambulatory Visit: Payer: Self-pay

## 2019-05-18 ENCOUNTER — Encounter: Payer: Self-pay | Admitting: *Deleted

## 2019-05-18 ENCOUNTER — Other Ambulatory Visit: Payer: Self-pay | Admitting: *Deleted

## 2019-05-18 DIAGNOSIS — C3481 Malignant neoplasm of overlapping sites of right bronchus and lung: Secondary | ICD-10-CM

## 2019-05-18 DIAGNOSIS — K521 Toxic gastroenteritis and colitis: Secondary | ICD-10-CM

## 2019-05-18 DIAGNOSIS — R918 Other nonspecific abnormal finding of lung field: Secondary | ICD-10-CM

## 2019-05-18 DIAGNOSIS — C3490 Malignant neoplasm of unspecified part of unspecified bronchus or lung: Secondary | ICD-10-CM

## 2019-05-18 DIAGNOSIS — G893 Neoplasm related pain (acute) (chronic): Secondary | ICD-10-CM

## 2019-05-18 DIAGNOSIS — T451X5A Adverse effect of antineoplastic and immunosuppressive drugs, initial encounter: Secondary | ICD-10-CM

## 2019-05-18 MED ORDER — HYDROCODONE-ACETAMINOPHEN 7.5-325 MG PO TABS: 1.0000 | ORAL_TABLET | Freq: Three times a day (TID) | ORAL | 0 refills | Status: DC | PRN

## 2019-05-18 MED ORDER — LORAZEPAM 0.5 MG PO TABS: 0.5000 mg | ORAL_TABLET | Freq: Four times a day (QID) | ORAL | 0 refills | Status: DC | PRN

## 2019-05-18 MED ORDER — DIPHENOXYLATE-ATROPINE 2.5-0.025 MG PO TABS: 2.0000 | ORAL_TABLET | Freq: Four times a day (QID) | ORAL | 0 refills | Status: AC | PRN

## 2019-05-18 MED ORDER — MORPHINE SULFATE ER 15 MG PO TBCR: 15.0000 mg | EXTENDED_RELEASE_TABLET | Freq: Two times a day (BID) | ORAL | 0 refills | Status: DC

## 2019-05-18 MED FILL — HYDROCODON-APAP 7.5-325: 7.5-325 | 20 days supply | Qty: 60 | Fill #0

## 2019-05-18 MED FILL — DIPHENOXYLATE-ATROPINE 2.5-: 2.5-0.025 | 4 days supply | Qty: 30 | Fill #0

## 2019-05-19 ENCOUNTER — Encounter: Payer: Self-pay | Admitting: Hematology

## 2019-05-19 ENCOUNTER — Other Ambulatory Visit: Payer: Self-pay

## 2019-05-19 ENCOUNTER — Inpatient Hospital Stay (HOSPITAL_BASED_OUTPATIENT_CLINIC_OR_DEPARTMENT_OTHER): Payer: Medicaid Other | Admitting: Hematology

## 2019-05-19 ENCOUNTER — Inpatient Hospital Stay: Payer: Medicaid Other

## 2019-05-19 ENCOUNTER — Ambulatory Visit: Payer: Self-pay | Admitting: Hematology

## 2019-05-19 VITALS — BP 144/91 | HR 102 | Temp 98.0°F | Resp 18

## 2019-05-19 VITALS — BP 144/91 | HR 102 | Temp 98.0°F | Resp 18 | Ht 60.0 in | Wt 142.1 lb

## 2019-05-19 DIAGNOSIS — D701 Agranulocytosis secondary to cancer chemotherapy: Secondary | ICD-10-CM

## 2019-05-19 DIAGNOSIS — D6959 Other secondary thrombocytopenia: Secondary | ICD-10-CM

## 2019-05-19 DIAGNOSIS — C3490 Malignant neoplasm of unspecified part of unspecified bronchus or lung: Secondary | ICD-10-CM

## 2019-05-19 DIAGNOSIS — Z95828 Presence of other vascular implants and grafts: Secondary | ICD-10-CM

## 2019-05-19 DIAGNOSIS — C3411 Malignant neoplasm of upper lobe, right bronchus or lung: Secondary | ICD-10-CM | POA: Diagnosis not present

## 2019-05-19 DIAGNOSIS — T451X5A Adverse effect of antineoplastic and immunosuppressive drugs, initial encounter: Secondary | ICD-10-CM

## 2019-05-19 DIAGNOSIS — G893 Neoplasm related pain (acute) (chronic): Secondary | ICD-10-CM

## 2019-05-19 DIAGNOSIS — Z79899 Other long term (current) drug therapy: Secondary | ICD-10-CM

## 2019-05-19 DIAGNOSIS — D6481 Anemia due to antineoplastic chemotherapy: Secondary | ICD-10-CM

## 2019-05-19 LAB — CBC WITH DIFFERENTIAL (CANCER CENTER ONLY)
Abs Immature Granulocytes: 0.05 10*3/uL (ref 0.00–0.07)
Basophils Absolute: 0 10*3/uL (ref 0.0–0.1)
Basophils Relative: 1 %
Eosinophils Absolute: 0 10*3/uL (ref 0.0–0.5)
Eosinophils Relative: 1 %
HCT: 24 % — ABNORMAL LOW (ref 36.0–46.0)
Hemoglobin: 7.9 g/dL — ABNORMAL LOW (ref 12.0–15.0)
Immature Granulocytes: 2 %
Lymphocytes Relative: 28 %
Lymphs Abs: 0.6 10*3/uL — ABNORMAL LOW (ref 0.7–4.0)
MCH: 31.3 pg (ref 26.0–34.0)
MCHC: 32.9 g/dL (ref 30.0–36.0)
MCV: 95.2 fL (ref 80.0–100.0)
Monocytes Absolute: 0.5 10*3/uL (ref 0.1–1.0)
Monocytes Relative: 20 %
Neutro Abs: 1.1 10*3/uL — ABNORMAL LOW (ref 1.7–7.7)
Neutrophils Relative %: 48 %
Platelet Count: 63 10*3/uL — ABNORMAL LOW (ref 150–400)
RBC: 2.52 MIL/uL — ABNORMAL LOW (ref 3.87–5.11)
RDW: 23.1 % — ABNORMAL HIGH (ref 11.5–15.5)
WBC Count: 2.2 10*3/uL — ABNORMAL LOW (ref 4.0–10.5)
nRBC: 0 % (ref 0.0–0.2)

## 2019-05-19 LAB — CMP (CANCER CENTER ONLY)
ALT: 14 U/L (ref 0–44)
AST: 19 U/L (ref 15–41)
Albumin: 3.7 g/dL (ref 3.5–5.0)
Alkaline Phosphatase: 102 U/L (ref 38–126)
Anion gap: 7 (ref 5–15)
BUN: 13 mg/dL (ref 6–20)
CO2: 25 mmol/L (ref 22–32)
Calcium: 9.2 mg/dL (ref 8.9–10.3)
Chloride: 105 mmol/L (ref 98–111)
Creatinine: 0.79 mg/dL (ref 0.44–1.00)
GFR, Est AFR Am: >60 mL/min (ref >60)
GFR, Estimated: >60 mL/min (ref >60)
Glucose, Bld: 124 mg/dL — ABNORMAL HIGH (ref 70–99)
Potassium: 4.3 mmol/L (ref 3.5–5.1)
Sodium: 137 mmol/L (ref 135–145)
Total Bilirubin: 0.2 mg/dL — ABNORMAL LOW (ref 0.3–1.2)
Total Protein: 6.8 g/dL (ref 6.5–8.1)

## 2019-05-19 MED ORDER — SODIUM CHLORIDE 0.9% FLUSH
10.0000 mL | INTRAVENOUS | Status: AC | PRN
  Administered 2019-05-19: 10 mL via INTRAVENOUS
  Filled 2019-05-19: qty 10

## 2019-05-19 MED ORDER — HEPARIN SOD (PORK) LOCK FLUSH 100 UNIT/ML IV SOLN
500.0000 [IU] | Freq: Once | INTRAVENOUS | Status: AC
  Administered 2019-05-19: 09:00:00 500 [IU] via INTRAVENOUS
  Filled 2019-05-19: qty 5

## 2019-05-19 NOTE — Patient Instructions (Signed)

## 2019-05-19 NOTE — Addendum Note (Signed)
Addended by: Melton Krebs on: 05/19/2019 09:34 AM   Modules accepted: Orders, SmartSet

## 2019-05-19 NOTE — Progress Notes (Signed)
[place] OFFICE PROGRESS NOTE  Patient Care Team: Patient, No Pcp Per as PCP - General (General Practice) [name], MD as Medical Oncologist (Oncology) Cordelia Poche, RN as Oncology Nurse Navigator  HEME/ONC OVERVIEW: 1. Limited stage small cell lung cancer, Stage IIIA ([id number]) -01/2019:   Large RUL mass w/ thoracic adenopathy and a second mass/consolidation in the RUL periphery on CTA chest  FDG-avid RUL mass with suspected mediastinal invasion, R paratracheal adenopathy; indeterminate consolidation in R apex (post-obstructive pneumonitis vs. satellite lesion in the same lobe)  MRI brain negative for malignancy -02/2019: bronchoscopy w/ biopsy of the RUL mass and 7R LN, path showed small cell lung cancer  -02/2019 - present: concurrent chemoRT with cisplatin/etoposide   2. 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 Port placement in 02/2019   TREATMENT REGIMEN:  02/24/2019 - present: concurrent chemoradiation with cisplatin/etoposide, plan for 4 cycles; RT started on 03/17/2019  03/2019 - present: Eliquis '5mg'$  BID   ASSESSMENT & PLAN:   Limited stage small cell lung cancer, Stage IIIA ([id number]) -S/p 3 cyclse of cisplatin/etoposide; treatment complicated by severe pancytopenia and neutropenic fever x 2   G-CSF added with Cycle 2 of chemotherapy  Etoposide dose reduced to '80mg'$ /m2 with Cycle 3 of chemotherapy -Labs reviewed; while thrombocytopenia and neutropenia are improving, they remain too low to resume treatment today, especially given recent severe thrombocytopenia and neutropenic fever with chemotherapy  -Therefore, I will delay chemotherapy by one week and repeat labs next week to monitor for count recovery  -Cisplatin dose from 75 to '60mg'$ /m2 with Cycle 4 of the treatment -In addition, I will add levofloxacin '500mg'$  daily and fluconazole '100mg'$  daily for antibacterial and antifungal ppx, respectively, during the  last cycle of chemotherapy due to recurrent neutropenic fever  -See the management of chemotherapy toxicities below -Plan for 4 cycles of chemotherapy, concurrent with radiation  -Once the patient completes the treatment, we will plan to repeat scans in 3-4 weeks to assess treatment response  -PRN anti-emetics: Zofran, Compazine, dexamethasone and Ativan   Chemotherapy-associated anemia -Secondary to chemotherapy -Hgb 7.9 today, stable since the recent discharge  -Patient denies any symptom of bleeding -Cisplatin dose reduction as above   Chemotherapy-associated thrombocytopenia -Secondary to chemotherapy -Plts 63k, improving since the recent discharge  -Patient denies any symptoms of bleeding or excess bruising, such as epistaxis, hematochezia, melena, or hematuria -Dose reduction of etoposide and cisplatin as outlined above   Chemotherapy-associated leukopenia -Secondary to chemotherapy -WBC 2.2k with ANC 1100, lower than the last visit   -Patient denies any symptoms of infection -Dose reduction of etoposide and cisplatin as outlined above, as well as antibacterial and antifungal prophylaxis   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 -On Eliquis since early 03/2019; on hold since recent hospitalization due to severe thrombocytopenia -As platelet count is now > 50k, I have instructed the patient to restart Eliquis  -I counseled the patient on monitoring for any symptoms of bleeding   Cancer-related pain -Likely due to pleural invasion by one of the RUL lung masses -Currently on MS-Contin '15mg'$  BID and Norco 7.5/325 q8hrs PRN; excess sedation with IR morphine -Pain reasonably controlled -Continue the current regimen for now   No orders of the defined types were placed in this encounter.  All questions were answered. The patient knows to call the clinic with any problems, questions or concerns. No barriers to learning was detected.  Return in 1 week for labs  and Cycle 4 of  chemotherapy.   [name], MD 2/9/20219:26 AM  CHIEF COMPLAINT: "I am doing fine"  INTERVAL HISTORY: Marcia King returns clinic for follow-up of limited stage small cell lung cancer on concurrent chemoradiation.  Since the recent hospitalization, patient has not yet restarted her radiation treatment, and has about 1 week of radiation treatment remaining.  She has been eating and drinking well, and she denies any fever, chest pain, dyspnea, abdominal pain, nausea, vomiting, or diarrhea.  She has not yet restarted her Eliquis.  She denies any other complaint today.  REVIEW OF SYSTEMS:   Constitutional: ( - ) fevers, ( - )  chills , ( - ) night sweats Eyes: ( - ) blurriness of vision, ( - ) double vision, ( - ) watery eyes Ears, nose, mouth, throat, and face: ( - ) mucositis, ( - ) sore throat Respiratory: ( - ) cough, ( - ) dyspnea, ( - ) wheezes Cardiovascular: ( - ) palpitation, ( - ) chest discomfort, ( - ) lower extremity swelling Gastrointestinal:  ( - ) nausea, ( - ) heartburn, ( - ) change in bowel habits Skin: ( - ) abnormal skin rashes Lymphatics: ( - ) new lymphadenopathy, ( - ) easy bruising Neurological: ( - ) numbness, ( - ) tingling, ( - ) new weaknesses Behavioral/Psych: ( - ) mood change, ( - ) new changes  All other systems were reviewed with the patient and are negative.  SUMMARY OF ONCOLOGIC HISTORY: Oncology History  Small cell lung cancer ([place])  01/28/2019 Initial Diagnosis   Lung cancer ([place])   01/28/2019 Imaging   CTA chest: IMPRESSION: 1. No demonstrable pulmonary embolus. No thoracic aortic aneurysm or dissection.   2. Mass arising in the right upper lobe extending into the right hilum measuring 3.1 x 2.5 cm. Questionable second mass in the right upper lobe more peripherally and anteriorly measuring 2.4 x 1.7 cm versus consolidation in this area. There is adenopathy in the right hilum and right paratracheal regions. Neoplastic involvement is felt to be  present. It may be prudent to correlate with nuclear medicine PET study to further characterize.   3. Underlying emphysematous change with areas of fibrosis throughout the periphery of each lung. Fibrosis is somewhat evenly distributed between upper and lower lobe regions. There is atelectatic change in the lung bases, more on the right than on the left.   Emphysema (ICD10-J43.9).   02/06/2019 Imaging   PET:   IMPRESSION: Right upper lobe perihilar lung mass is intensely hypermetabolic compatible with primary bronchogenic carcinoma. Suspect mediastinal invasion with hypermetabolic right paratracheal adenopathy. Assuming a non-small cell neoplasm this is favored to represent a T4N1M0 lesion or stage IIIa disease.   Indeterminate nodular area of subpleural consolidation in with thin the lateral right apex has an SUV max of 4.96. This may represent an area of postobstructive pneumonitis versus satellite lesion in same lobe.   Aortic Atherosclerosis (ICD10-I70.0) and Emphysema (ICD10-J43.9).   02/07/2019 Imaging   MRI brain: IMPRESSION: 1. Normal MRI appearance the brain. No evidence for metastatic disease to the brain. 2. Left mastoid effusion without obstructing nasopharyngeal lesion.   02/12/2019 Pathology Results   Specimen Submitted:  A. LUNG, RIGHT UPPER LOBE, LAVAGE:    FINAL MICROSCOPIC DIAGNOSIS:  - Atypical cells present  - See comment   SPECIMEN ADEQUACY:  Satisfactory for evaluation   DIAGNOSTIC COMMENTS:  Mixed acute and chronic inflammation is present.  There are rare atypical cells present on the  heme slides only.  These  are not seen on the cytology smears.    02/12/2019 Pathology Results   FINAL MICROSCOPIC DIAGNOSIS:   A.   MASS, RIGHT HILAR, FINE NEEDLE ASPIRATION:  -  Malignant cells consistent with small cell carcinoma  -  See comment   B. LYMPH NODE, 7 NODE, FINE NEEDLE ASPIRATION:  -  No malignant cells identified   COMMENT:   A.   Immunohistochemistry performed on the cell block shows the  neoplastic cells are positive for TTF-1, CD56, and synaptophysin but  negative for chromogranin, cytokeratin 5 6 and p40.  Overall, the  combined morphology and immunophenotype are consistent with small cell  carcinoma.    02/18/2019 Cancer Staging   Staging form: Lung, AJCC 8th Edition - Clinical: Stage IIIA (cT4, cN1, cM0) - Signed by [name], MD on 02/18/2019   02/24/2019 -  Chemotherapy   The patient had palonosetron (ALOXI) injection 0.25 mg, 0.25 mg, Intravenous,  Once, 3 of 4 cycles Administration: 0.25 mg (02/24/2019), 0.25 mg (03/24/2019), 0.25 mg (04/21/2019) pegfilgrastim-cbqv (UDENYCA) injection 6 mg, 6 mg, Subcutaneous, Once, 2 of 3 cycles Administration: 6 mg (03/27/2019), 6 mg (04/24/2019) CISplatin (PLATINOL) 125 mg in sodium chloride 0.9 % 500 mL chemo infusion, 75 mg/m2 = 125 mg (100 % of original dose 75 mg/m2), Intravenous,  Once, 3 of 4 cycles Dose modification: 75 mg/m2 (original dose 75 mg/m2, Cycle 1, Reason: Provider Judgment), 60 mg/m2 (original dose 75 mg/m2, Cycle 4, Reason: Dose not tolerated) Administration: 125 mg (02/24/2019), 125 mg (03/24/2019), 125 mg (04/21/2019) etoposide (VEPESID) 170 mg in sodium chloride 0.9 % 500 mL chemo infusion, 100 mg/m2 = 170 mg, Intravenous,  Once, 3 of 4 cycles Dose modification: 80 mg/m2 (original dose 100 mg/m2, Cycle 2, Reason: Dose not tolerated), 100 mg/m2 (original dose 100 mg/m2, Cycle 2, Reason: Provider Judgment), 80 mg/m2 (original dose 100 mg/m2, Cycle 3, Reason: Treatment Parameters Not Met) Administration: 170 mg (02/24/2019), 170 mg (02/25/2019), 170 mg (02/26/2019), 170 mg (03/24/2019), 170 mg (03/25/2019), 170 mg (03/26/2019), 130 mg (04/21/2019), 130 mg (04/22/2019), 130 mg (04/23/2019) fosaprepitant (EMEND) 150 mg, dexamethasone (DECADRON) 12 mg in sodium chloride 0.9 % 145 mL IVPB, , Intravenous,  Once, 3 of 4 cycles Administration:  (02/24/2019),   (03/24/2019),  (04/21/2019)  for chemotherapy treatment.      I have reviewed the past medical history, past surgical history, social history and family history with the patient and they are unchanged from previous note.  ALLERGIES:  has No Known Allergies.  MEDICATIONS:  Current Outpatient Medications  Medication Sig Dispense Refill  . albuterol (PROVENTIL HFA;VENTOLIN HFA) 108 (90 Base) MCG/ACT inhaler Inhale 1-2 puffs into the lungs every 4 (four) hours as needed for wheezing or shortness of breath.     Marland Kitchen apixaban (ELIQUIS) 5 MG TABS tablet Take 1 tablet (5 mg total) by mouth 2 (two) times daily. 60 tablet 11  . aspirin EC 325 MG tablet Take by mouth.    . calcium-vitamin D (OSCAL WITH D) 500-200 MG-UNIT TABS tablet Take 1 tablet by mouth every morning.    Marland Kitchen dexamethasone (DECADRON) 4 MG tablet Take 2 tablets two times a day for 1 day on day 4 after cisplatin chemotherapy. Take with food. 30 tablet 1  . diphenoxylate-atropine (LOMOTIL) 2.5-0.025 MG tablet Take 2 tablets by mouth 4 (four) times daily as needed for diarrhea or loose stools. 30 tablet 0  . HYDROcodone-acetaminophen (NORCO) 7.5-325 MG tablet Take 1 tablet by mouth every 8 (  eight) hours as needed for moderate pain. 60 tablet 0  . ibuprofen (ADVIL) 800 MG tablet Take 1 tablet (800 mg total) by mouth 3 (three) times daily. (Patient taking differently: Take 800 mg by mouth every 8 (eight) hours as needed (pain). ) 21 tablet 0  . lidocaine-prilocaine (EMLA) cream Apply to affected area once 30 g 3  . LORazepam (ATIVAN) 0.5 MG tablet Take 1 tablet (0.5 mg total) by mouth every 6 (six) hours as needed (Nausea or vomiting). 30 tablet 0  . magnesium oxide (MAG-OX) 400 MG tablet Take 1 tablet by mouth daily.    Marland Kitchen morphine (MS CONTIN) 15 MG 12 hr tablet Take 1 tablet (15 mg total) by mouth every 12 (twelve) hours. 60 tablet 0  . nystatin (MYCOSTATIN) 100000 UNIT/ML suspension Take 5 mLs (500,000 Units total) by mouth 4 (four) times daily.  Swish and swallow 60 mL 2  . ondansetron (ZOFRAN) 8 MG tablet Take 1 tablet (8 mg total) by mouth 2 (two) times daily as needed. Start on the third day after cisplatin chemotherapy. 30 tablet 2  . pantoprazole (PROTONIX) 40 MG tablet Take 1 tablet (40 mg total) by mouth daily. 30 tablet 3  . prochlorperazine (COMPAZINE) 10 MG tablet Take 1 tablet (10 mg total) by mouth every 6 (six) hours as needed (Nausea or vomiting). 30 tablet 1  . sucralfate (CARAFATE) 1 GM/10ML suspension Take 10 mLs (1 g total) by mouth 4 (four) times daily -  with meals and at bedtime. 420 mL 0   No current facility-administered medications for this visit.    PHYSICAL EXAMINATION: ECOG PERFORMANCE STATUS: 1 - Symptomatic but completely ambulatory  Today's Vitals   05/19/19 0917  BP: (!) 144/91  Pulse: (!) 102  Resp: 18  Temp: 98 F (36.7 C)  TempSrc: Temporal  SpO2: 100%  Weight: 142 lb 1.6 oz (64.5 kg)  Height: 5' (1.524 m)  PainSc: 0-No pain   Body mass index is 27.75 kg/m.  Filed Weights   05/19/19 0917  Weight: 142 lb 1.6 oz (64.5 kg)    GENERAL: alert, no distress and comfortable SKIN: skin color, texture, turgor are normal, no rashes or significant lesions EYES: conjunctiva are pink and non-injected, sclera clear OROPHARYNX: no exudate, no erythema; lips, buccal mucosa, and tongue normal  NECK: supple, non-tender LUNGS: clear to auscultation with normal breathing effort HEART: regular rate & rhythm and no murmurs and no lower extremity edema ABDOMEN: soft, non-tender, non-distended, normal bowel sounds Musculoskeletal: no cyanosis of digits and no clubbing  PSYCH: alert & oriented x 3, fluent speech  LABORATORY DATA:  I have reviewed the data as listed    Component Value Date/Time   NA 136 05/11/2019 0912   K 4.5 05/11/2019 0912   CL 104 05/11/2019 0912   CO2 22 05/11/2019 0912   GLUCOSE 88 05/11/2019 0912   BUN 15 05/11/2019 0912   CREATININE 0.79 05/11/2019 0912   CALCIUM 9.4  05/11/2019 0912   PROT 7.2 05/11/2019 0912   ALBUMIN 4.0 05/11/2019 0912   AST 15 05/11/2019 0912   ALT 14 05/11/2019 0912   ALKPHOS 114 05/11/2019 0912   BILITOT 0.3 05/11/2019 0912   GFRNONAA >60 05/11/2019 0912   GFRAA >60 05/11/2019 0912    No results found for: SPEP, UPEP  Lab Results  Component Value Date   WBC 2.2 (L) 05/19/2019   NEUTROABS 1.1 (L) 05/19/2019   HGB 7.9 (L) 05/19/2019   HCT 24.0 (L) 05/19/2019  MCV 95.2 05/19/2019   PLT 63 (L) 05/19/2019      Chemistry      Component Value Date/Time   NA 136 05/11/2019 0912   K 4.5 05/11/2019 0912   CL 104 05/11/2019 0912   CO2 22 05/11/2019 0912   BUN 15 05/11/2019 0912   CREATININE 0.79 05/11/2019 0912      Component Value Date/Time   CALCIUM 9.4 05/11/2019 0912   ALKPHOS 114 05/11/2019 0912   AST 15 05/11/2019 0912   ALT 14 05/11/2019 0912   BILITOT 0.3 05/11/2019 0912       RADIOGRAPHIC STUDIES: I have personally reviewed the radiological images as listed below and agreed with the findings in the report. No results found.

## 2019-05-20 ENCOUNTER — Ambulatory Visit: Payer: Self-pay

## 2019-05-21 ENCOUNTER — Inpatient Hospital Stay: Payer: Medicaid Other

## 2019-05-22 ENCOUNTER — Inpatient Hospital Stay: Payer: Medicaid Other

## 2019-05-22 MED FILL — MORPHINE SULF ER 15 MG TAB: 15 | 30 days supply | Qty: 60 | Fill #0

## 2019-05-22 MED FILL — LORazepam 0.5 MG TABS: 0.5 | 7 days supply | Qty: 30 | Fill #0

## 2019-05-26 ENCOUNTER — Encounter: Payer: Self-pay | Admitting: *Deleted

## 2019-05-26 ENCOUNTER — Inpatient Hospital Stay: Payer: Medicaid Other

## 2019-05-26 ENCOUNTER — Other Ambulatory Visit: Payer: Self-pay

## 2019-05-26 ENCOUNTER — Other Ambulatory Visit: Payer: Self-pay | Admitting: Hematology

## 2019-05-26 VITALS — HR 117

## 2019-05-26 DIAGNOSIS — C3490 Malignant neoplasm of unspecified part of unspecified bronchus or lung: Secondary | ICD-10-CM

## 2019-05-26 DIAGNOSIS — C3411 Malignant neoplasm of upper lobe, right bronchus or lung: Secondary | ICD-10-CM | POA: Diagnosis not present

## 2019-05-26 LAB — CBC WITH DIFFERENTIAL (CANCER CENTER ONLY)
Abs Immature Granulocytes: 0.04 10*3/uL (ref 0.00–0.07)
Basophils Absolute: 0 10*3/uL (ref 0.0–0.1)
Basophils Relative: 1 %
Eosinophils Absolute: 0.1 10*3/uL (ref 0.0–0.5)
Eosinophils Relative: 2 %
HCT: 27.1 % — ABNORMAL LOW (ref 36.0–46.0)
Hemoglobin: 9.2 g/dL — ABNORMAL LOW (ref 12.0–15.0)
Immature Granulocytes: 1 %
Lymphocytes Relative: 21 %
Lymphs Abs: 0.6 10*3/uL — ABNORMAL LOW (ref 0.7–4.0)
MCH: 32.2 pg (ref 26.0–34.0)
MCHC: 33.9 g/dL (ref 30.0–36.0)
MCV: 94.8 fL (ref 80.0–100.0)
Monocytes Absolute: 0.4 10*3/uL (ref 0.1–1.0)
Monocytes Relative: 15 %
Neutro Abs: 1.7 10*3/uL (ref 1.7–7.7)
Neutrophils Relative %: 60 %
Platelet Count: 111 10*3/uL — ABNORMAL LOW (ref 150–400)
RBC: 2.86 MIL/uL — ABNORMAL LOW (ref 3.87–5.11)
RDW: 21.9 % — ABNORMAL HIGH (ref 11.5–15.5)
WBC Count: 2.9 10*3/uL — ABNORMAL LOW (ref 4.0–10.5)
nRBC: 0 % (ref 0.0–0.2)

## 2019-05-26 LAB — CMP (CANCER CENTER ONLY)
ALT: 26 U/L (ref 0–44)
AST: 23 U/L (ref 15–41)
Albumin: 4 g/dL (ref 3.5–5.0)
Alkaline Phosphatase: 91 U/L (ref 38–126)
Anion gap: 9 (ref 5–15)
BUN: 14 mg/dL (ref 6–20)
CO2: 25 mmol/L (ref 22–32)
Calcium: 9.8 mg/dL (ref 8.9–10.3)
Chloride: 102 mmol/L (ref 98–111)
Creatinine: 0.85 mg/dL (ref 0.44–1.00)
GFR, Est AFR Am: >60 mL/min (ref >60)
GFR, Estimated: >60 mL/min (ref >60)
Glucose, Bld: 109 mg/dL — ABNORMAL HIGH (ref 70–99)
Potassium: 4 mmol/L (ref 3.5–5.1)
Sodium: 136 mmol/L (ref 135–145)
Total Bilirubin: 0.3 mg/dL (ref 0.3–1.2)
Total Protein: 7.2 g/dL (ref 6.5–8.1)

## 2019-05-26 MED ORDER — PALONOSETRON HCL INJECTION 0.25 MG/5ML
INTRAVENOUS | Status: AC
  Filled 2019-05-26: qty 5

## 2019-05-26 MED ORDER — PALONOSETRON HCL INJECTION 0.25 MG/5ML
0.2500 mg | Freq: Once | INTRAVENOUS | Status: AC
  Administered 2019-05-26: 0.25 mg via INTRAVENOUS

## 2019-05-26 MED ORDER — SODIUM CHLORIDE 0.9 % IV SOLN
80.0000 mg/m2 | Freq: Once | INTRAVENOUS | Status: AC
  Administered 2019-05-26: 130 mg via INTRAVENOUS
  Filled 2019-05-26: qty 6.5

## 2019-05-26 MED ORDER — SODIUM CHLORIDE 0.9 % IV SOLN
55.0000 mg/m2 | Freq: Once | INTRAVENOUS | Status: AC
  Administered 2019-05-26: 91 mg via INTRAVENOUS
  Filled 2019-05-26: qty 91

## 2019-05-26 MED ORDER — POTASSIUM CHLORIDE 2 MEQ/ML IV SOLN
Freq: Once | INTRAVENOUS | Status: AC
  Filled 2019-05-26: qty 10

## 2019-05-26 MED ORDER — SODIUM CHLORIDE 0.9 % IV SOLN
Freq: Once | INTRAVENOUS | Status: AC
  Filled 2019-05-26: qty 5

## 2019-05-26 MED ORDER — SODIUM CHLORIDE 0.9 % IV SOLN
Freq: Once | INTRAVENOUS | Status: AC
  Filled 2019-05-26: qty 250

## 2019-05-26 MED ORDER — HEPARIN SOD (PORK) LOCK FLUSH 100 UNIT/ML IV SOLN
500.0000 [IU] | Freq: Once | INTRAVENOUS | Status: AC | PRN
  Administered 2019-05-26: 500 [IU]
  Filled 2019-05-26: qty 5

## 2019-05-26 MED ORDER — FLUCONAZOLE 100 MG PO TABS: 100.0000 mg | ORAL_TABLET | Freq: Every day | ORAL | 1 refills | Status: AC

## 2019-05-26 MED ORDER — SODIUM CHLORIDE 0.9% FLUSH
10.0000 mL | INTRAVENOUS | Status: DC | PRN
  Administered 2019-05-26: 10 mL
  Filled 2019-05-26: qty 10

## 2019-05-26 MED ORDER — LEVOFLOXACIN 500 MG PO TABS: 500.0000 mg | ORAL_TABLET | Freq: Every day | ORAL | 1 refills | Status: AC

## 2019-05-26 MED FILL — levoFLOXacin 500 MG TABS: 500 | 30 days supply | Qty: 30 | Fill #0

## 2019-05-26 MED FILL — FLUCONAZOLE 100 MG TAB: 100 | 30 days supply | Qty: 30 | Fill #0

## 2019-05-26 NOTE — Patient Instructions (Signed)
[place] Discharge Instructions for Patients Receiving Chemotherapy  Today you received the following chemotherapy agents Cisplatin, VP16  To help prevent nausea and vomiting after your treatment, we encourage you to take your nausea medication    If you develop nausea and vomiting that is not controlled by your nausea medication, call the clinic.   BELOW ARE SYMPTOMS THAT SHOULD BE REPORTED IMMEDIATELY:  *FEVER GREATER THAN 100.5 F  *CHILLS WITH OR WITHOUT FEVER  NAUSEA AND VOMITING THAT IS NOT CONTROLLED WITH YOUR NAUSEA MEDICATION  *UNUSUAL SHORTNESS OF BREATH  *UNUSUAL BRUISING OR BLEEDING  TENDERNESS IN MOUTH AND THROAT WITH OR WITHOUT PRESENCE OF ULCERS  *URINARY PROBLEMS  *BOWEL PROBLEMS  UNUSUAL RASH Items with * indicate a potential emergency and should be followed up as soon as possible.  Feel free to call the clinic should you have any questions or concerns. The clinic phone number is [phone].  Please show the [place] at check-in to the Emergency Department and triage nurse.

## 2019-05-26 NOTE — Progress Notes (Signed)
MD reviewed CBC and CMET, ok to treat despite counts and elevated HR

## 2019-05-27 ENCOUNTER — Inpatient Hospital Stay: Payer: Medicaid Other

## 2019-05-27 VITALS — BP 152/92 | HR 108 | Temp 97.3°F | Resp 17

## 2019-05-27 DIAGNOSIS — C3411 Malignant neoplasm of upper lobe, right bronchus or lung: Secondary | ICD-10-CM | POA: Diagnosis not present

## 2019-05-27 DIAGNOSIS — C3490 Malignant neoplasm of unspecified part of unspecified bronchus or lung: Secondary | ICD-10-CM

## 2019-05-27 MED ORDER — DEXAMETHASONE SODIUM PHOSPHATE 10 MG/ML IJ SOLN
INTRAMUSCULAR | Status: AC
  Filled 2019-05-27: qty 1

## 2019-05-27 MED ORDER — DEXAMETHASONE SODIUM PHOSPHATE 10 MG/ML IJ SOLN
10.0000 mg | Freq: Once | INTRAMUSCULAR | Status: AC
  Administered 2019-05-27: 10 mg via INTRAVENOUS

## 2019-05-27 MED ORDER — SODIUM CHLORIDE 0.9% FLUSH
10.0000 mL | INTRAVENOUS | Status: DC | PRN
  Administered 2019-05-27: 10 mL
  Filled 2019-05-27: qty 10

## 2019-05-27 MED ORDER — HEPARIN SOD (PORK) LOCK FLUSH 100 UNIT/ML IV SOLN
500.0000 [IU] | Freq: Once | INTRAVENOUS | Status: AC | PRN
  Administered 2019-05-27: 500 [IU]
  Filled 2019-05-27: qty 5

## 2019-05-27 MED ORDER — SODIUM CHLORIDE 0.9 % IV SOLN
80.0000 mg/m2 | Freq: Once | INTRAVENOUS | Status: AC
  Administered 2019-05-27: 130 mg via INTRAVENOUS
  Filled 2019-05-27: qty 6.5

## 2019-05-27 MED ORDER — SODIUM CHLORIDE 0.9 % IV SOLN
Freq: Once | INTRAVENOUS | Status: AC
  Filled 2019-05-27: qty 250

## 2019-05-27 NOTE — Patient Instructions (Addendum)
Etoposide, VP-16 injection What is this medicine? ETOPOSIDE, VP-16 (e toe POE side) is a chemotherapy drug. It is used to treat testicular cancer, lung cancer, and other cancers. This medicine may be used for other purposes; ask your health care provider or pharmacist if you have questions. COMMON BRAND NAME(S): Etopophos, Toposar, VePesid What should I tell my health care provider before I take this medicine? They need to know if you have any of these conditions:  infection  kidney disease  liver disease  low blood counts, like low white cell, platelet, or red cell counts  an unusual or allergic reaction to etoposide, other medicines, foods, dyes, or preservatives  pregnant or trying to get pregnant  breast-feeding How should I use this medicine? This medicine is for infusion into a vein. It is administered in a hospital or clinic by a specially trained health care professional. Talk to your pediatrician regarding the use of this medicine in children. Special care may be needed. Overdosage: If you think you have taken too much of this medicine contact a poison control center or emergency room at once. NOTE: This medicine is only for you. Do not share this medicine with others. What if I miss a dose? It is important not to miss your dose. Call your doctor or health care professional if you are unable to keep an appointment. What may interact with this medicine? This medicine may interact with the following medications:  warfarin This list may not describe all possible interactions. Give your health care provider a list of all the medicines, herbs, non-prescription drugs, or dietary supplements you use. Also tell them if you smoke, drink alcohol, or use illegal drugs. Some items may interact with your medicine. What should I watch for while using this medicine? Visit your doctor for checks on your progress. This drug may make you feel generally unwell. This is not uncommon, as  chemotherapy can affect healthy cells as well as cancer cells. Report any side effects. Continue your course of treatment even though you feel ill unless your doctor tells you to stop. In some cases, you may be given additional medicines to help with side effects. Follow all directions for their use. Call your doctor or health care professional for advice if you get a fever, chills or sore throat, or other symptoms of a cold or flu. Do not treat yourself. This drug decreases your body's ability to fight infections. Try to avoid being around people who are sick. This medicine may increase your risk to bruise or bleed. Call your doctor or health care professional if you notice any unusual bleeding. Talk to your doctor about your risk of cancer. You may be more at risk for certain types of cancers if you take this medicine. Do not become pregnant while taking this medicine or for at least 6 months after stopping it. Women should inform their doctor if they wish to become pregnant or think they might be pregnant. Women of child-bearing potential will need to have a negative pregnancy test before starting this medicine. There is a potential for serious side effects to an unborn child. Talk to your health care professional or pharmacist for more information. Do not breast-feed an infant while taking this medicine. Men must use a latex condom during sexual contact with a woman while taking this medicine and for at least 4 months after stopping it. A latex condom is needed even if you have had a vasectomy. Contact your doctor right away if your partner  becomes pregnant. Do not donate sperm while taking this medicine and for at least 4 months after you stop taking this medicine. Men should inform their doctors if they wish to father a child. This medicine may lower sperm counts. What side effects may I notice from receiving this medicine? Side effects that you should report to your doctor or health care professional  as soon as possible:  allergic reactions like skin rash, itching or hives, swelling of the face, lips, or tongue  low blood counts - this medicine may decrease the number of white blood cells, red blood cells, and platelets. You may be at increased risk for infections and bleeding  nausea, vomiting  redness, blistering, peeling or loosening of the skin, including inside the mouth  signs and symptoms of infection like fever; chills; cough; sore throat; pain or trouble passing urine  signs and symptoms of low red blood cells or anemia such as unusually weak or tired; feeling faint or lightheaded; falls; breathing problems  unusual bruising or bleeding Side effects that usually do not require medical attention (report to your doctor or health care professional if they continue or are bothersome):  changes in taste  diarrhea  hair loss  loss of appetite  mouth sores This list may not describe all possible side effects. Call your doctor for medical advice about side effects. You may report side effects to FDA at 1-800-FDA-1088. Where should I keep my medicine? This drug is given in a hospital or clinic and will not be stored at home. NOTE: This sheet is a summary. It may not cover all possible information. If you have questions about this medicine, talk to your doctor, pharmacist, or health care provider.  2020 Elsevier/Gold Standard (2018-05-21 16:57:15) Dexamethasone injection What is this medicine? DEXAMETHASONE (dex a METH a sone) is a corticosteroid. It is used to treat inflammation of the skin, joints, lungs, and other organs. Common conditions treated include asthma, allergies, and arthritis. It is also used for other conditions, like blood disorders and diseases of the adrenal glands. This medicine may be used for other purposes; ask your health care provider or pharmacist if you have questions. COMMON BRAND NAME(S): Decadron, DoubleDex, Simplist Dexamethasone, Solurex What  should I tell my health care provider before I take this medicine? They need to know if you have any of these conditions:  Cushing's syndrome  diabetes  glaucoma  heart disease  high blood pressure  infection like herpes, measles, tuberculosis, or chickenpox  kidney disease  liver disease  mental illness  myasthenia gravis  osteoporosis  previous heart attack  seizures  stomach or intestine problems  thyroid disease  an unusual or allergic reaction to dexamethasone, corticosteroids, other medicines, lactose, foods, dyes, or preservatives  pregnant or trying to get pregnant  breast-feeding How should I use this medicine? This medicine is for injection into a muscle, joint, lesion, soft tissue, or vein. It is given by a health care professional in a hospital or clinic setting. Talk to your pediatrician regarding the use of this medicine in children. Special care may be needed. Overdosage: If you think you have taken too much of this medicine contact a poison control center or emergency room at once. NOTE: This medicine is only for you. Do not share this medicine with others. What if I miss a dose? This may not apply. If you are having a series of injections over a prolonged period, try not to miss an appointment. Call your doctor or health care  professional to reschedule if you are unable to keep an appointment. What may interact with this medicine? Do not take this medicine with any of the following medications:  live virus vaccines This medicine may also interact with the following medications:  aminoglutethimide  amphotericin B  aspirin and aspirin-like medicines  certain antibiotics like erythromycin, clarithromycin, and troleandomycin  certain antivirals for HIV or hepatitis  certain medicines for seizures like carbamazepine, phenobarbital, phenytoin  certain medicines to treat myasthenia  gravis  cholestyramine  cyclosporine  digoxin  diuretics  ephedrine  female hormones, like estrogen or progestins and birth control pills  insulin or other medicines for diabetes  isoniazid  ketoconazole  medicines that relax muscles for surgery  mifepristone  NSAIDs, medicines for pain and inflammation, like ibuprofen or naproxen  rifampin  skin tests for allergies  thalidomide  vaccines  warfarin This list may not describe all possible interactions. Give your health care provider a list of all the medicines, herbs, non-prescription drugs, or dietary supplements you use. Also tell them if you smoke, drink alcohol, or use illegal drugs. Some items may interact with your medicine. What should I watch for while using this medicine? Visit your health care professional for regular checks on your progress. Tell your health care professional if your symptoms do not start to get better or if they get worse. Your condition will be monitored carefully while you are receiving this medicine. Wear a medical ID bracelet or chain. Carry a card that describes your disease and details of your medicine and dosage times. This medicine may increase your risk of getting an infection. Call your health care professional for advice if you get a fever, chills, or sore throat, or other symptoms of a cold or flu. Do not treat yourself. Try to avoid being around people who are sick. Call your health care professional if you are around anyone with measles, chickenpox, or if you develop sores or blisters that do not heal properly. If you are going to need surgery or other procedures, tell your doctor or health care professional that you have taken this medicine within the last 12 months. Ask your doctor or health care professional about your diet. You may need to lower the amount of salt you eat. This medicine may increase blood sugar. Ask your healthcare provider if changes in diet or medicines are  needed if you have diabetes. What side effects may I notice from receiving this medicine? Side effects that you should report to your doctor or health care professional as soon as possible:  allergic reactions like skin rash, itching or hives, swelling of the face, lips, or tongue  bloody or black, tarry stools  changes in emotions or moods  changes in vision  confusion, excitement, restlessness  depressed mood  eye pain  hallucinations  muscle weakness  severe or sudden stomach or belly pain  signs and symptoms of high blood sugar such as being more thirsty or hungry or having to urinate more than normal. You may also feel very tired or have blurry vision.  signs and symptoms of infection like fever; chills; cough; sore throat; pain or trouble passing urine  swelling of ankles, feet  unusual bruising or bleeding  wounds that do not heal Side effects that usually do not require medical attention (report to your doctor or health care professional if they continue or are bothersome):  increased appetite  increased growth of face or body hair  headache  nausea, vomiting  pain, redness,  or irritation at site where injected  skin problems, acne, thin and shiny skin  trouble sleeping  weight gain This list may not describe all possible side effects. Call your doctor for medical advice about side effects. You may report side effects to FDA at 1-800-FDA-1088. Where should I keep my medicine? This medicine is given in a hospital or clinic and will not be stored at home. NOTE: This sheet is a summary. It may not cover all possible information. If you have questions about this medicine, talk to your doctor, pharmacist, or health care provider.  2020 Elsevier/Gold Standard (2018-10-07 13:51:58) Etoposide, VP-16 injection What is this medicine? ETOPOSIDE, VP-16 (e toe POE side) is a chemotherapy drug. It is used to treat testicular cancer, lung cancer, and other  cancers. This medicine may be used for other purposes; ask your health care provider or pharmacist if you have questions. COMMON BRAND NAME(S): Etopophos, Toposar, VePesid What should I tell my health care provider before I take this medicine? They need to know if you have any of these conditions:  infection  kidney disease  liver disease  low blood counts, like low white cell, platelet, or red cell counts  an unusual or allergic reaction to etoposide, other medicines, foods, dyes, or preservatives  pregnant or trying to get pregnant  breast-feeding How should I use this medicine? This medicine is for infusion into a vein. It is administered in a hospital or clinic by a specially trained health care professional. Talk to your pediatrician regarding the use of this medicine in children. Special care may be needed. Overdosage: If you think you have taken too much of this medicine contact a poison control center or emergency room at once. NOTE: This medicine is only for you. Do not share this medicine with others. What if I miss a dose? It is important not to miss your dose. Call your doctor or health care professional if you are unable to keep an appointment. What may interact with this medicine? This medicine may interact with the following medications:  warfarin This list may not describe all possible interactions. Give your health care provider a list of all the medicines, herbs, non-prescription drugs, or dietary supplements you use. Also tell them if you smoke, drink alcohol, or use illegal drugs. Some items may interact with your medicine. What should I watch for while using this medicine? Visit your doctor for checks on your progress. This drug may make you feel generally unwell. This is not uncommon, as chemotherapy can affect healthy cells as well as cancer cells. Report any side effects. Continue your course of treatment even though you feel ill unless your doctor tells you to  stop. In some cases, you may be given additional medicines to help with side effects. Follow all directions for their use. Call your doctor or health care professional for advice if you get a fever, chills or sore throat, or other symptoms of a cold or flu. Do not treat yourself. This drug decreases your body's ability to fight infections. Try to avoid being around people who are sick. This medicine may increase your risk to bruise or bleed. Call your doctor or health care professional if you notice any unusual bleeding. Talk to your doctor about your risk of cancer. You may be more at risk for certain types of cancers if you take this medicine. Do not become pregnant while taking this medicine or for at least 6 months after stopping it. Women should inform their doctor  if they wish to become pregnant or think they might be pregnant. Women of child-bearing potential will need to have a negative pregnancy test before starting this medicine. There is a potential for serious side effects to an unborn child. Talk to your health care professional or pharmacist for more information. Do not breast-feed an infant while taking this medicine. Men must use a latex condom during sexual contact with a woman while taking this medicine and for at least 4 months after stopping it. A latex condom is needed even if you have had a vasectomy. Contact your doctor right away if your partner becomes pregnant. Do not donate sperm while taking this medicine and for at least 4 months after you stop taking this medicine. Men should inform their doctors if they wish to father a child. This medicine may lower sperm counts. What side effects may I notice from receiving this medicine? Side effects that you should report to your doctor or health care professional as soon as possible:  allergic reactions like skin rash, itching or hives, swelling of the face, lips, or tongue  low blood counts - this medicine may decrease the number of  white blood cells, red blood cells, and platelets. You may be at increased risk for infections and bleeding  nausea, vomiting  redness, blistering, peeling or loosening of the skin, including inside the mouth  signs and symptoms of infection like fever; chills; cough; sore throat; pain or trouble passing urine  signs and symptoms of low red blood cells or anemia such as unusually weak or tired; feeling faint or lightheaded; falls; breathing problems  unusual bruising or bleeding Side effects that usually do not require medical attention (report to your doctor or health care professional if they continue or are bothersome):  changes in taste  diarrhea  hair loss  loss of appetite  mouth sores This list may not describe all possible side effects. Call your doctor for medical advice about side effects. You may report side effects to FDA at 1-800-FDA-1088. Where should I keep my medicine? This drug is given in a hospital or clinic and will not be stored at home. NOTE: This sheet is a summary. It may not cover all possible information. If you have questions about this medicine, talk to your doctor, pharmacist, or health care provider.  2020 Elsevier/Gold Standard (2018-05-21 16:57:15)

## 2019-05-28 ENCOUNTER — Other Ambulatory Visit: Payer: Self-pay

## 2019-05-28 ENCOUNTER — Encounter: Payer: Self-pay | Admitting: *Deleted

## 2019-05-28 ENCOUNTER — Inpatient Hospital Stay: Payer: Medicaid Other

## 2019-05-28 VITALS — BP 156/95 | Temp 97.0°F | Resp 16

## 2019-05-28 DIAGNOSIS — C3411 Malignant neoplasm of upper lobe, right bronchus or lung: Secondary | ICD-10-CM | POA: Diagnosis not present

## 2019-05-28 DIAGNOSIS — C3490 Malignant neoplasm of unspecified part of unspecified bronchus or lung: Secondary | ICD-10-CM

## 2019-05-28 MED ORDER — SODIUM CHLORIDE 0.9 % IV SOLN
80.0000 mg/m2 | Freq: Once | INTRAVENOUS | Status: AC
  Administered 2019-05-28: 130 mg via INTRAVENOUS
  Filled 2019-05-28: qty 6.5

## 2019-05-28 MED ORDER — DEXAMETHASONE SODIUM PHOSPHATE 10 MG/ML IJ SOLN
10.0000 mg | Freq: Once | INTRAMUSCULAR | Status: AC
  Administered 2019-05-28: 10 mg via INTRAVENOUS

## 2019-05-28 MED ORDER — DEXAMETHASONE SODIUM PHOSPHATE 10 MG/ML IJ SOLN
INTRAMUSCULAR | Status: AC
  Filled 2019-05-28: qty 1

## 2019-05-28 MED ORDER — SODIUM CHLORIDE 0.9 % IV SOLN
Freq: Once | INTRAVENOUS | Status: AC
  Filled 2019-05-28: qty 250

## 2019-05-28 MED ORDER — SODIUM CHLORIDE 0.9% FLUSH
10.0000 mL | INTRAVENOUS | Status: DC | PRN
  Administered 2019-05-28: 10 mL
  Filled 2019-05-28: qty 10

## 2019-05-28 MED ORDER — HEPARIN SOD (PORK) LOCK FLUSH 100 UNIT/ML IV SOLN
500.0000 [IU] | Freq: Once | INTRAVENOUS | Status: AC | PRN
  Administered 2019-05-28: 500 [IU]
  Filled 2019-05-28: qty 5

## 2019-05-28 NOTE — Patient Instructions (Signed)
Etoposide, VP-16 injection What is this medicine? ETOPOSIDE, VP-16 (e toe POE side) is a chemotherapy drug. It is used to treat testicular cancer, lung cancer, and other cancers. This medicine may be used for other purposes; ask your health care provider or pharmacist if you have questions. COMMON BRAND NAME(S): Etopophos, Toposar, VePesid What should I tell my health care provider before I take this medicine? They need to know if you have any of these conditions:  infection  kidney disease  liver disease  low blood counts, like low white cell, platelet, or red cell counts  an unusual or allergic reaction to etoposide, other medicines, foods, dyes, or preservatives  pregnant or trying to get pregnant  breast-feeding How should I use this medicine? This medicine is for infusion into a vein. It is administered in a hospital or clinic by a specially trained health care professional. Talk to your pediatrician regarding the use of this medicine in children. Special care may be needed. Overdosage: If you think you have taken too much of this medicine contact a poison control center or emergency room at once. NOTE: This medicine is only for you. Do not share this medicine with others. What if I miss a dose? It is important not to miss your dose. Call your doctor or health care professional if you are unable to keep an appointment. What may interact with this medicine? This medicine may interact with the following medications:  warfarin This list may not describe all possible interactions. Give your health care provider a list of all the medicines, herbs, non-prescription drugs, or dietary supplements you use. Also tell them if you smoke, drink alcohol, or use illegal drugs. Some items may interact with your medicine. What should I watch for while using this medicine? Visit your doctor for checks on your progress. This drug may make you feel generally unwell. This is not uncommon, as  chemotherapy can affect healthy cells as well as cancer cells. Report any side effects. Continue your course of treatment even though you feel ill unless your doctor tells you to stop. In some cases, you may be given additional medicines to help with side effects. Follow all directions for their use. Call your doctor or health care professional for advice if you get a fever, chills or sore throat, or other symptoms of a cold or flu. Do not treat yourself. This drug decreases your body's ability to fight infections. Try to avoid being around people who are sick. This medicine may increase your risk to bruise or bleed. Call your doctor or health care professional if you notice any unusual bleeding. Talk to your doctor about your risk of cancer. You may be more at risk for certain types of cancers if you take this medicine. Do not become pregnant while taking this medicine or for at least 6 months after stopping it. Women should inform their doctor if they wish to become pregnant or think they might be pregnant. Women of child-bearing potential will need to have a negative pregnancy test before starting this medicine. There is a potential for serious side effects to an unborn child. Talk to your health care professional or pharmacist for more information. Do not breast-feed an infant while taking this medicine. Men must use a latex condom during sexual contact with a woman while taking this medicine and for at least 4 months after stopping it. A latex condom is needed even if you have had a vasectomy. Contact your doctor right away if your partner  becomes pregnant. Do not donate sperm while taking this medicine and for at least 4 months after you stop taking this medicine. Men should inform their doctors if they wish to father a child. This medicine may lower sperm counts. What side effects may I notice from receiving this medicine? Side effects that you should report to your doctor or health care professional  as soon as possible:  allergic reactions like skin rash, itching or hives, swelling of the face, lips, or tongue  low blood counts - this medicine may decrease the number of white blood cells, red blood cells, and platelets. You may be at increased risk for infections and bleeding  nausea, vomiting  redness, blistering, peeling or loosening of the skin, including inside the mouth  signs and symptoms of infection like fever; chills; cough; sore throat; pain or trouble passing urine  signs and symptoms of low red blood cells or anemia such as unusually weak or tired; feeling faint or lightheaded; falls; breathing problems  unusual bruising or bleeding Side effects that usually do not require medical attention (report to your doctor or health care professional if they continue or are bothersome):  changes in taste  diarrhea  hair loss  loss of appetite  mouth sores This list may not describe all possible side effects. Call your doctor for medical advice about side effects. You may report side effects to FDA at 1-800-FDA-1088. Where should I keep my medicine? This drug is given in a hospital or clinic and will not be stored at home. NOTE: This sheet is a summary. It may not cover all possible information. If you have questions about this medicine, talk to your doctor, pharmacist, or health care provider.  2020 [employer]/Gold Standard (2018-05-21 16:57:15)

## 2019-05-29 ENCOUNTER — Ambulatory Visit: Payer: Self-pay

## 2019-05-29 ENCOUNTER — Other Ambulatory Visit: Payer: Self-pay | Admitting: Hematology

## 2019-05-29 ENCOUNTER — Inpatient Hospital Stay: Payer: Medicaid Other

## 2019-05-29 VITALS — BP 145/102 | HR 100 | Temp 97.7°F | Resp 18

## 2019-05-29 DIAGNOSIS — C3411 Malignant neoplasm of upper lobe, right bronchus or lung: Secondary | ICD-10-CM | POA: Diagnosis not present

## 2019-05-29 DIAGNOSIS — C3490 Malignant neoplasm of unspecified part of unspecified bronchus or lung: Secondary | ICD-10-CM

## 2019-05-29 MED ORDER — PEGFILGRASTIM-CBQV 6 MG/0.6ML ~~LOC~~ SOSY
6.0000 mg | PREFILLED_SYRINGE | Freq: Once | SUBCUTANEOUS | Status: AC
  Administered 2019-05-29: 6 mg via SUBCUTANEOUS

## 2019-05-29 MED ORDER — BENZONATATE 100 MG PO CAPS: 100.0000 mg | ORAL_CAPSULE | Freq: Three times a day (TID) | ORAL | 2 refills | Status: AC | PRN

## 2019-05-29 MED ORDER — GUAIFENESIN-DM 100-10 MG/5ML PO SYRP: 10.0000 mL | ORAL_SOLUTION | Freq: Four times a day (QID) | ORAL | 3 refills | Status: AC | PRN

## 2019-05-29 MED ORDER — PEGFILGRASTIM-CBQV 6 MG/0.6ML ~~LOC~~ SOSY
PREFILLED_SYRINGE | SUBCUTANEOUS | Status: AC
  Filled 2019-05-29: qty 0.6

## 2019-05-29 MED FILL — BENZONATATE 100 MG CAPS: 100 | 14 days supply | Qty: 40 | Fill #0

## 2019-05-29 MED FILL — SM TUSSIN DM SYRUP: 100-10 | 3 days supply | Qty: 118 | Fill #0

## 2019-05-29 NOTE — Patient Instructions (Signed)

## 2019-06-02 ENCOUNTER — Encounter: Payer: Self-pay | Admitting: Pharmacy Technician

## 2019-06-02 ENCOUNTER — Other Ambulatory Visit: Payer: Self-pay

## 2019-06-02 ENCOUNTER — Inpatient Hospital Stay: Payer: Medicaid Other

## 2019-06-02 ENCOUNTER — Encounter: Payer: Self-pay | Admitting: *Deleted

## 2019-06-02 VITALS — BP 134/82 | HR 94 | Temp 98.9°F | Resp 18

## 2019-06-02 DIAGNOSIS — C3490 Malignant neoplasm of unspecified part of unspecified bronchus or lung: Secondary | ICD-10-CM

## 2019-06-02 DIAGNOSIS — E860 Dehydration: Secondary | ICD-10-CM

## 2019-06-02 DIAGNOSIS — C3411 Malignant neoplasm of upper lobe, right bronchus or lung: Secondary | ICD-10-CM | POA: Diagnosis not present

## 2019-06-02 LAB — CBC WITH DIFFERENTIAL (CANCER CENTER ONLY)
Abs Immature Granulocytes: 0.1 10*3/uL — ABNORMAL HIGH (ref 0.00–0.07)
Basophils Absolute: 0 10*3/uL (ref 0.0–0.1)
Basophils Relative: 1 %
Eosinophils Absolute: 0.1 10*3/uL (ref 0.0–0.5)
Eosinophils Relative: 8 %
HCT: 23.9 % — ABNORMAL LOW (ref 36.0–46.0)
Hemoglobin: 8 g/dL — ABNORMAL LOW (ref 12.0–15.0)
Lymphocytes Relative: 24 %
Lymphs Abs: 0.3 10*3/uL — ABNORMAL LOW (ref 0.7–4.0)
MCH: 32.5 pg (ref 26.0–34.0)
MCHC: 33.5 g/dL (ref 30.0–36.0)
MCV: 97.2 fL (ref 80.0–100.0)
Metamyelocytes Relative: 4 %
Monocytes Absolute: 0 10*3/uL — ABNORMAL LOW (ref 0.1–1.0)
Monocytes Relative: 1 %
Neutro Abs: 0.8 10*3/uL — ABNORMAL LOW (ref 1.7–7.7)
Neutrophils Relative %: 62 %
Platelet Count: 27 10*3/uL — ABNORMAL LOW (ref 150–400)
RBC: 2.46 MIL/uL — ABNORMAL LOW (ref 3.87–5.11)
RDW: 19.5 % — ABNORMAL HIGH (ref 11.5–15.5)
WBC Count: 1.3 10*3/uL — ABNORMAL LOW (ref 4.0–10.5)
nRBC: 0 % (ref 0.0–0.2)

## 2019-06-02 LAB — CMP (CANCER CENTER ONLY)
ALT: 14 U/L (ref 0–44)
AST: 11 U/L — ABNORMAL LOW (ref 15–41)
Albumin: 3.7 g/dL (ref 3.5–5.0)
Alkaline Phosphatase: 84 U/L (ref 38–126)
Anion gap: 8 (ref 5–15)
BUN: 35 mg/dL — ABNORMAL HIGH (ref 6–20)
CO2: 24 mmol/L (ref 22–32)
Calcium: 9.1 mg/dL (ref 8.9–10.3)
Chloride: 104 mmol/L (ref 98–111)
Creatinine: 1.02 mg/dL — ABNORMAL HIGH (ref 0.44–1.00)
GFR, Est AFR Am: >60 mL/min (ref >60)
GFR, Estimated: >60 mL/min (ref >60)
Glucose, Bld: 115 mg/dL — ABNORMAL HIGH (ref 70–99)
Potassium: 3.6 mmol/L (ref 3.5–5.1)
Sodium: 136 mmol/L (ref 135–145)
Total Bilirubin: 0.4 mg/dL (ref 0.3–1.2)
Total Protein: 6.5 g/dL (ref 6.5–8.1)

## 2019-06-02 MED ORDER — SODIUM CHLORIDE 0.9 % IV SOLN
Freq: Once | INTRAVENOUS | Status: AC
  Filled 2019-06-02: qty 250

## 2019-06-02 MED ORDER — SODIUM CHLORIDE 0.9% FLUSH
10.0000 mL | Freq: Once | INTRAVENOUS | Status: AC | PRN
  Administered 2019-06-02: 10 mL
  Filled 2019-06-02: qty 10

## 2019-06-02 MED ORDER — HEPARIN SOD (PORK) LOCK FLUSH 100 UNIT/ML IV SOLN
250.0000 [IU] | Freq: Once | INTRAVENOUS | Status: AC | PRN
  Administered 2019-06-02: 500 [IU]
  Filled 2019-06-02: qty 5

## 2019-06-02 NOTE — Progress Notes (Signed)
Received message from patient via MyChart stating she was feeling poorly. She wanted to know if she could be seen today, or go to the ED. Spoke to Dr Maylon Peppers. We will bring her in today for labs and fluids to assess further needs. Appointment scheduled and patient notified to come in.   Patient was assessed and will come back the next two days for IVF. Appointments scheduled.

## 2019-06-02 NOTE — Patient Instructions (Signed)

## 2019-06-02 NOTE — Progress Notes (Signed)
Patient no longer getting Udenyca from [place] based on Medicaid coverage. Last DOS covered is 05/29/19.

## 2019-06-03 ENCOUNTER — Inpatient Hospital Stay: Payer: Medicaid Other

## 2019-06-03 VITALS — BP 125/77 | HR 93 | Temp 98.4°F | Resp 17

## 2019-06-03 DIAGNOSIS — C3411 Malignant neoplasm of upper lobe, right bronchus or lung: Secondary | ICD-10-CM | POA: Diagnosis not present

## 2019-06-03 DIAGNOSIS — E860 Dehydration: Secondary | ICD-10-CM

## 2019-06-03 DIAGNOSIS — C3490 Malignant neoplasm of unspecified part of unspecified bronchus or lung: Secondary | ICD-10-CM

## 2019-06-03 MED ORDER — SODIUM CHLORIDE 0.9% FLUSH
10.0000 mL | Freq: Once | INTRAVENOUS | Status: AC | PRN
  Administered 2019-06-03: 10 mL
  Filled 2019-06-03: qty 10

## 2019-06-03 MED ORDER — MORPHINE SULFATE 4 MG/ML IJ SOLN
2.0000 mg | Freq: Once | INTRAMUSCULAR | Status: AC
  Administered 2019-06-03: 2 mg via INTRAVENOUS
  Filled 2019-06-03: qty 1

## 2019-06-03 MED ORDER — MORPHINE SULFATE (PF) 4 MG/ML IV SOLN
INTRAVENOUS | Status: AC
  Filled 2019-06-03: qty 1

## 2019-06-03 MED ORDER — SODIUM CHLORIDE 0.9 % IV SOLN
Freq: Once | INTRAVENOUS | Status: AC
  Filled 2019-06-03: qty 250

## 2019-06-03 MED ORDER — HEPARIN SOD (PORK) LOCK FLUSH 100 UNIT/ML IV SOLN
500.0000 [IU] | Freq: Once | INTRAVENOUS | Status: AC | PRN
  Administered 2019-06-03: 500 [IU]
  Filled 2019-06-03: qty 5

## 2019-06-03 NOTE — Patient Instructions (Signed)

## 2019-06-04 ENCOUNTER — Inpatient Hospital Stay: Payer: Medicaid Other

## 2019-06-04 ENCOUNTER — Inpatient Hospital Stay (HOSPITAL_BASED_OUTPATIENT_CLINIC_OR_DEPARTMENT_OTHER): Payer: Medicaid Other | Admitting: Hematology

## 2019-06-04 ENCOUNTER — Other Ambulatory Visit: Payer: Self-pay | Admitting: *Deleted

## 2019-06-04 ENCOUNTER — Other Ambulatory Visit: Payer: Self-pay

## 2019-06-04 ENCOUNTER — Other Ambulatory Visit: Payer: Self-pay | Admitting: Family

## 2019-06-04 ENCOUNTER — Encounter: Payer: Self-pay | Admitting: Hematology

## 2019-06-04 ENCOUNTER — Telehealth: Payer: Self-pay | Admitting: *Deleted

## 2019-06-04 ENCOUNTER — Encounter: Payer: Self-pay | Admitting: *Deleted

## 2019-06-04 VITALS — BP 128/72 | HR 110 | Temp 97.6°F | Resp 17

## 2019-06-04 VITALS — BP 104/76 | HR 107 | Temp 98.6°F | Resp 18 | Ht 60.0 in | Wt 142.0 lb

## 2019-06-04 DIAGNOSIS — D701 Agranulocytosis secondary to cancer chemotherapy: Secondary | ICD-10-CM

## 2019-06-04 DIAGNOSIS — C3490 Malignant neoplasm of unspecified part of unspecified bronchus or lung: Secondary | ICD-10-CM

## 2019-06-04 DIAGNOSIS — D6959 Other secondary thrombocytopenia: Secondary | ICD-10-CM

## 2019-06-04 DIAGNOSIS — C3411 Malignant neoplasm of upper lobe, right bronchus or lung: Secondary | ICD-10-CM | POA: Diagnosis not present

## 2019-06-04 DIAGNOSIS — K209 Esophagitis, unspecified without bleeding: Secondary | ICD-10-CM

## 2019-06-04 DIAGNOSIS — D696 Thrombocytopenia, unspecified: Secondary | ICD-10-CM

## 2019-06-04 DIAGNOSIS — T451X5A Adverse effect of antineoplastic and immunosuppressive drugs, initial encounter: Secondary | ICD-10-CM

## 2019-06-04 DIAGNOSIS — D6481 Anemia due to antineoplastic chemotherapy: Secondary | ICD-10-CM

## 2019-06-04 DIAGNOSIS — G893 Neoplasm related pain (acute) (chronic): Secondary | ICD-10-CM | POA: Diagnosis not present

## 2019-06-04 DIAGNOSIS — E860 Dehydration: Secondary | ICD-10-CM

## 2019-06-04 LAB — CMP (CANCER CENTER ONLY)
ALT: 11 U/L (ref 0–44)
AST: 8 U/L — ABNORMAL LOW (ref 15–41)
Albumin: 3.6 g/dL (ref 3.5–5.0)
Alkaline Phosphatase: 73 U/L (ref 38–126)
Anion gap: 8 (ref 5–15)
BUN: 17 mg/dL (ref 6–20)
CO2: 23 mmol/L (ref 22–32)
Calcium: 8.9 mg/dL (ref 8.9–10.3)
Chloride: 106 mmol/L (ref 98–111)
Creatinine: 0.9 mg/dL (ref 0.44–1.00)
GFR, Est AFR Am: >60 mL/min (ref >60)
GFR, Estimated: >60 mL/min (ref >60)
Glucose, Bld: 116 mg/dL — ABNORMAL HIGH (ref 70–99)
Potassium: 3.7 mmol/L (ref 3.5–5.1)
Sodium: 137 mmol/L (ref 135–145)
Total Bilirubin: 0.4 mg/dL (ref 0.3–1.2)
Total Protein: 6.3 g/dL — ABNORMAL LOW (ref 6.5–8.1)

## 2019-06-04 LAB — CBC WITH DIFFERENTIAL (CANCER CENTER ONLY)
Abs Immature Granulocytes: 0 10*3/uL (ref 0.00–0.07)
Basophils Absolute: 0 10*3/uL (ref 0.0–0.1)
Basophils Relative: 4 %
Eosinophils Absolute: 0 10*3/uL (ref 0.0–0.5)
Eosinophils Relative: 15 %
HCT: 20.4 % — ABNORMAL LOW (ref 36.0–46.0)
Hemoglobin: 7 g/dL — ABNORMAL LOW (ref 12.0–15.0)
Immature Granulocytes: 0 %
Lymphocytes Relative: 65 %
Lymphs Abs: 0.2 10*3/uL — ABNORMAL LOW (ref 0.7–4.0)
MCH: 32.7 pg (ref 26.0–34.0)
MCHC: 34.3 g/dL (ref 30.0–36.0)
MCV: 95.3 fL (ref 80.0–100.0)
Monocytes Absolute: 0 10*3/uL — ABNORMAL LOW (ref 0.1–1.0)
Monocytes Relative: 8 %
Neutro Abs: 0 10*3/uL — CL (ref 1.7–7.7)
Neutrophils Relative %: 8 %
Platelet Count: 9 10*3/uL — CL (ref 150–400)
RBC: 2.14 MIL/uL — ABNORMAL LOW (ref 3.87–5.11)
RDW: 17.5 % — ABNORMAL HIGH (ref 11.5–15.5)
WBC Count: 0.3 10*3/uL — CL (ref 4.0–10.5)
nRBC: 0 % (ref 0.0–0.2)

## 2019-06-04 LAB — ABO/RH: ABO/RH(D): O POS

## 2019-06-04 LAB — PREPARE RBC (CROSSMATCH)

## 2019-06-04 MED ORDER — SODIUM CHLORIDE 0.9 % IV SOLN
Freq: Once | INTRAVENOUS | Status: AC
  Filled 2019-06-04: qty 250

## 2019-06-04 MED ORDER — SODIUM CHLORIDE 0.9% FLUSH
10.0000 mL | Freq: Once | INTRAVENOUS | Status: AC | PRN
  Administered 2019-06-04: 15:00:00 10 mL
  Filled 2019-06-04: qty 10

## 2019-06-04 MED ORDER — SUCRALFATE 1 GM/10ML PO SUSP: 1.0000 g | Freq: Three times a day (TID) | ORAL | 2 refills | Status: AC

## 2019-06-04 MED ORDER — HEPARIN SOD (PORK) LOCK FLUSH 100 UNIT/ML IV SOLN
500.0000 [IU] | Freq: Once | INTRAVENOUS | Status: AC | PRN
  Administered 2019-06-04: 500 [IU]
  Filled 2019-06-04: qty 5

## 2019-06-04 MED FILL — SUCRALFATE 1 GM/10ML SUSP: 1 | 11 days supply | Qty: 420 | Fill #0

## 2019-06-04 NOTE — Telephone Encounter (Signed)
Marcia King from lab brought me a panic level on WBC is 0.3, Platelet is 9, ABS Granulocyte is 0.0 and Hemoglobin is 7.0. Results given to MD.

## 2019-06-04 NOTE — Patient Instructions (Signed)

## 2019-06-04 NOTE — Progress Notes (Signed)
[place] OFFICE PROGRESS NOTE  Patient Care Team: Patient, No Pcp Per as PCP - General (General Practice) [name], MD as Medical Oncologist (Oncology) Cordelia Poche, RN as Oncology Nurse Navigator  HEME/ONC OVERVIEW: 1. Limited stage small cell lung cancer, Stage IIIA ([id number]) -01/2019:   Large RUL mass w/ thoracic adenopathy and a second mass/consolidation in the RUL periphery on CTA chest  FDG-avid RUL mass with suspected mediastinal invasion, R paratracheal adenopathy; indeterminate consolidation in R apex (post-obstructive pneumonitis vs. satellite lesion in the same lobe)  MRI brain negative for malignancy -02/2019: bronchoscopy w/ biopsy of the RUL mass and 7R LN, path showed small cell lung cancer  -02/2019 - present: concurrent chemoRT with cisplatin/etoposide   2. 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 Port placement in 02/2019   TREATMENT REGIMEN:  02/24/2019 - 05/29/2019: concurrent chemoradiation with cisplatin/etoposide x 4 cycles   03/2019 - present: Eliquis '5mg'$  BID   ASSESSMENT & PLAN:   Limited stage small cell lung cancer, Stage IIIA ([id number]) -S/p 4 cyclse of cisplatin/etoposide; treatment complicated by severe pancytopenia and neutropenic fever x 2   G-CSF added with Cycle 2 of chemotherapy  Etoposide dose reduced to '80mg'$ /m2 with Cycle 3 of chemotherapy -Labs reviewed and notable for severe anemia and thrombocytopenia (see management below) -I have ordered CT chest, abdomen/pelvis w/ contrast in 2 weeks to assess treatment response -Pending CT results, we will determine the next steps  -PRN anti-emetics: Zofran, Compazine, dexamethasone and Ativan   Chemotherapy-associated anemia -Secondary to chemotherapy -Hgb 7.0 today, very low  -Patient denies any symptom of bleeding -I have ordered STAT type and screen, and will administer 2 units RBC on 06/05/2019 -We will plan to check her labs  twice a week and assess her need for transfusion until count recovery   Chemotherapy-associated thrombocytopenia -Secondary to chemotherapy -Plts 9k, severely low -Patient denies any symptoms of bleeding or excess bruising, such as epistaxis, hematochezia, melena, or hematuria -Hold Eliquis  -We will administer 1 unit plts on 06/05/2019, and check labs 2x/week with PRN transfusion until count recovery   Chemotherapy-associated leukopenia -Secondary to chemotherapy -WBC 0.3k with ANC 0 -Patient denies any symptoms of infection -On levofloxacin and fluconazole for ppx  -As she had already received Neulasta on 05/29/2019, additional G-CSF, such as Neupogen, would not improve the rate of ANC recovery  -Patient is counseled on any fever (Tmax > 100.4), for which she is instructed to go to the ER for further evaluation   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 CT CHEST W CONTRAST    Standing Status:   Future    Standing Expiration Date:   06/03/2020    Order Specific Question:   If indicated for the ordered procedure, I authorize the administration of contrast media per Radiology protocol    Answer:   Yes    Order Specific Question:   Preferred imaging location?    Answer:   [facility]    Order Specific Question:  Radiology Contrast Protocol - do NOT remove file path    Answer:   \\[email or web address]    Order Specific Question:   Is patient pregnant?    Answer:   No  . CT ABDOMEN PELVIS W CONTRAST     Standing Status:   Future    Standing Expiration Date:   06/03/2020    Order Specific Question:   ** REASON FOR EXAM (FREE TEXT)    Answer:   S/p concurrent chemoRT    Order Specific Question:   If indicated for the ordered procedure, I authorize the administration of contrast media per Radiology protocol    Answer:   Yes    Order Specific Question:   Preferred imaging location?    Answer:   [facility] Specific Question:   Is Oral Contrast requested for this exam?    Answer:   Yes, Per Radiology protocol    Order Specific Question:   Radiology Contrast Protocol - do NOT remove file path    Answer:   \\[email or web address]    Order Specific Question:   Is patient pregnant?    Answer:   No  . Practitioner attestation of consent    I, the ordering practitioner, attest that I have discussed with the patient the benefits, risks, side effects, alternatives, likelihood of achieving goals and potential problems during recovery for the procedure listed.    Standing Status:   Future    Standing Expiration Date:   06/03/2020    Order Specific Question:   Procedure    Answer:   Blood Product(s)  . Complete patient signature process for consent form    Standing Status:   Future    Standing Expiration Date:   06/03/2020  . Care order/instruction    Transfuse Parameters    Standing Status:   Future    Standing Expiration Date:   06/03/2020  . Type and screen    Standing Status:   Future    Number of Occurrences:   1    Standing Expiration Date:   06/03/2020    All questions were answered. The patient knows to call the clinic with any problems, questions or concerns. No barriers to learning was detected.  Labs 2x/week w/ PRN transfusion. Return on 06/18/2019 for CT results and clinic follow-up.   [name], MD 2/25/20219:53 AM  CHIEF COMPLAINT: "I am feeling a little better"  INTERVAL HISTORY: Marcia King returns to clinic for follow-up of limited  stage small cell lung cancer on concurrent chemoradiation.  Patient reports that she still has some pain with swallowing, but she has been using Magic mouthwash with some improvement.  She has been trying to maintain hydration, but is not eating or drinking as much as she would like to.  She checks her temperature daily, and her T-max was 99.3 today.  She denies any symptoms of bleeding.  REVIEW OF SYSTEMS:   Constitutional: ( - ) fevers, ( - )  chills , ( - ) night sweats Eyes: ( - ) blurriness of vision, ( - ) double vision, ( - ) watery eyes Ears, nose, mouth, throat, and face: ( - ) mucositis, ( - ) sore throat Respiratory: ( - ) cough, ( - ) dyspnea, ( - ) wheezes Cardiovascular: ( - ) palpitation, ( - ) chest discomfort, ( - ) lower extremity swelling Gastrointestinal:  ( - ) nausea, ( - ) heartburn, ( - ) change in bowel habits Skin: ( - )  abnormal skin rashes Lymphatics: ( - ) new lymphadenopathy, ( - ) easy bruising Neurological: ( - ) numbness, ( - ) tingling, ( - ) new weaknesses Behavioral/Psych: ( - ) mood change, ( - ) new changes  All other systems were reviewed with the patient and are negative.  SUMMARY OF ONCOLOGIC HISTORY: Oncology History  Small cell lung cancer ([place])  01/28/2019 Initial Diagnosis   Lung cancer ([place])   01/28/2019 Imaging   CTA chest: IMPRESSION: 1. No demonstrable pulmonary embolus. No thoracic aortic aneurysm or dissection.   2. Mass arising in the right upper lobe extending into the right hilum measuring 3.1 x 2.5 cm. Questionable second mass in the right upper lobe more peripherally and anteriorly measuring 2.4 x 1.7 cm versus consolidation in this area. There is adenopathy in the right hilum and right paratracheal regions. Neoplastic involvement is felt to be present. It may be prudent to correlate with nuclear medicine PET study to further characterize.   3. Underlying emphysematous change with areas of fibrosis throughout the periphery of  each lung. Fibrosis is somewhat evenly distributed between upper and lower lobe regions. There is atelectatic change in the lung bases, more on the right than on the left.   Emphysema (ICD10-J43.9).   02/06/2019 Imaging   PET:   IMPRESSION: Right upper lobe perihilar lung mass is intensely hypermetabolic compatible with primary bronchogenic carcinoma. Suspect mediastinal invasion with hypermetabolic right paratracheal adenopathy. Assuming a non-small cell neoplasm this is favored to represent a T4N1M0 lesion or stage IIIa disease.   Indeterminate nodular area of subpleural consolidation in with thin the lateral right apex has an SUV max of 4.96. This may represent an area of postobstructive pneumonitis versus satellite lesion in same lobe.   Aortic Atherosclerosis (ICD10-I70.0) and Emphysema (ICD10-J43.9).   02/07/2019 Imaging   MRI brain: IMPRESSION: 1. Normal MRI appearance the brain. No evidence for metastatic disease to the brain. 2. Left mastoid effusion without obstructing nasopharyngeal lesion.   02/12/2019 Pathology Results   Specimen Submitted:  A. LUNG, RIGHT UPPER LOBE, LAVAGE:    FINAL MICROSCOPIC DIAGNOSIS:  - Atypical cells present  - See comment   SPECIMEN ADEQUACY:  Satisfactory for evaluation   DIAGNOSTIC COMMENTS:  Mixed acute and chronic inflammation is present.  There are rare atypical cells present on the heme slides only.  These  are not seen on the cytology smears.    02/12/2019 Pathology Results   FINAL MICROSCOPIC DIAGNOSIS:   A.   MASS, RIGHT HILAR, FINE NEEDLE ASPIRATION:  -  Malignant cells consistent with small cell carcinoma  -  See comment   B. LYMPH NODE, 7 NODE, FINE NEEDLE ASPIRATION:  -  No malignant cells identified   COMMENT:   A.  Immunohistochemistry performed on the cell block shows the  neoplastic cells are positive for TTF-1, CD56, and synaptophysin but  negative for chromogranin, cytokeratin 5 6 and p40.  Overall,  the  combined morphology and immunophenotype are consistent with small cell  carcinoma.    02/18/2019 Cancer Staging   Staging form: Lung, AJCC 8th Edition - Clinical: Stage IIIA (cT4, cN1, cM0) - Signed by [name], MD on 02/18/2019   02/24/2019 -  Chemotherapy   The patient had palonosetron (ALOXI) injection 0.25 mg, 0.25 mg, Intravenous,  Once, 4 of 4 cycles Administration: 0.25 mg (02/24/2019), 0.25 mg (03/24/2019), 0.25 mg (04/21/2019), 0.25 mg (05/26/2019) pegfilgrastim-cbqv (UDENYCA) injection 6 mg, 6 mg, Subcutaneous, Once, 3 of 3 cycles Administration: 6 mg (03/27/2019),  6 mg (04/24/2019), 6 mg (05/29/2019) CISplatin (PLATINOL) 125 mg in sodium chloride 0.9 % 500 mL chemo infusion, 75 mg/m2 = 125 mg (100 % of original dose 75 mg/m2), Intravenous,  Once, 4 of 4 cycles Dose modification: 75 mg/m2 (original dose 75 mg/m2, Cycle 1, Reason: Provider Judgment), 60 mg/m2 (original dose 75 mg/m2, Cycle 4, Reason: Dose not tolerated), 55 mg/m2 (original dose 75 mg/m2, Cycle 4, Reason: Treatment Parameters Not Met) Administration: 125 mg (02/24/2019), 125 mg (03/24/2019), 125 mg (04/21/2019), 91 mg (05/26/2019) etoposide (VEPESID) 170 mg in sodium chloride 0.9 % 500 mL chemo infusion, 100 mg/m2 = 170 mg, Intravenous,  Once, 4 of 4 cycles Dose modification: 80 mg/m2 (original dose 100 mg/m2, Cycle 2, Reason: Dose not tolerated), 100 mg/m2 (original dose 100 mg/m2, Cycle 2, Reason: Provider Judgment), 80 mg/m2 (original dose 100 mg/m2, Cycle 3, Reason: Treatment Parameters Not Met) Administration: 170 mg (02/24/2019), 170 mg (02/25/2019), 170 mg (02/26/2019), 170 mg (03/24/2019), 170 mg (03/25/2019), 170 mg (03/26/2019), 130 mg (04/21/2019), 130 mg (04/22/2019), 130 mg (04/23/2019), 130 mg (05/26/2019), 130 mg (05/27/2019), 130 mg (05/28/2019) fosaprepitant (EMEND) 150 mg, dexamethasone (DECADRON) 12 mg in sodium chloride 0.9 % 145 mL IVPB, , Intravenous,  Once, 4 of 4 cycles Administration:  (02/24/2019),   (03/24/2019),  (04/21/2019),  (05/26/2019)  for chemotherapy treatment.      I have reviewed the past medical history, past surgical history, social history and family history with the patient and they are unchanged from previous note.  ALLERGIES:  has No Known Allergies.  MEDICATIONS:  Current Outpatient Medications  Medication Sig Dispense Refill  . albuterol (PROVENTIL HFA;VENTOLIN HFA) 108 (90 Base) MCG/ACT inhaler Inhale 1-2 puffs into the lungs every 4 (four) hours as needed for wheezing or shortness of breath.     Marland Kitchen apixaban (ELIQUIS) 5 MG TABS tablet Take 1 tablet (5 mg total) by mouth 2 (two) times daily. 60 tablet 11  . aspirin EC 325 MG tablet Take by mouth.    . benzonatate (TESSALON) 100 MG capsule Take 1 capsule (100 mg total) by mouth 3 (three) times daily as needed for cough. 40 capsule 2  . calcium-vitamin D (OSCAL WITH D) 500-200 MG-UNIT TABS tablet Take 1 tablet by mouth every morning.    Marland Kitchen dexamethasone (DECADRON) 4 MG tablet Take 2 tablets two times a day for 1 day on day 4 after cisplatin chemotherapy. Take with food. 30 tablet 1  . diphenoxylate-atropine (LOMOTIL) 2.5-0.025 MG tablet Take 2 tablets by mouth 4 (four) times daily as needed for diarrhea or loose stools. 30 tablet 0  . fluconazole (DIFLUCAN) 100 MG tablet Take 1 tablet (100 mg total) by mouth daily. 30 tablet 1  . guaiFENesin-dextromethorphan (ROBITUSSIN DM) 100-10 MG/5ML syrup Take 10 mLs by mouth every 6 (six) hours as needed for cough. 120 mL 3  . HYDROcodone-acetaminophen (NORCO) 7.5-325 MG tablet Take 1 tablet by mouth every 8 (eight) hours as needed for moderate pain. 60 tablet 0  . ibuprofen (ADVIL) 800 MG tablet Take 1 tablet (800 mg total) by mouth 3 (three) times daily. (Patient taking differently: Take 800 mg by mouth every 8 (eight) hours as needed (pain). ) 21 tablet 0  . levofloxacin (LEVAQUIN) 500 MG tablet Take 1 tablet (500 mg total) by mouth daily. 30 tablet 1  . lidocaine-prilocaine (EMLA)  cream Apply to affected area once 30 g 3  . LORazepam (ATIVAN) 0.5 MG tablet Take 1 tablet (0.5 mg total) by mouth every 6 (six) hours  as needed (Nausea or vomiting). 30 tablet 0  . magnesium oxide (MAG-OX) 400 MG tablet Take 1 tablet by mouth daily.    Marland Kitchen morphine (MS CONTIN) 15 MG 12 hr tablet Take 1 tablet (15 mg total) by mouth every 12 (twelve) hours. 60 tablet 0  . nystatin (MYCOSTATIN) 100000 UNIT/ML suspension Take 5 mLs (500,000 Units total) by mouth 4 (four) times daily. Swish and swallow 60 mL 2  . ondansetron (ZOFRAN) 8 MG tablet Take 1 tablet (8 mg total) by mouth 2 (two) times daily as needed. Start on the third day after cisplatin chemotherapy. 30 tablet 2  . pantoprazole (PROTONIX) 40 MG tablet Take 1 tablet (40 mg total) by mouth daily. 30 tablet 3  . prochlorperazine (COMPAZINE) 10 MG tablet Take 1 tablet (10 mg total) by mouth every 6 (six) hours as needed (Nausea or vomiting). 30 tablet 1  . sucralfate (CARAFATE) 1 GM/10ML suspension Take 10 mLs (1 g total) by mouth 4 (four) times daily -  with meals and at bedtime. 420 mL 2   No current facility-administered medications for this visit.   Facility-Administered Medications Ordered in Other Visits  Medication Dose Route Frequency Provider Last Rate Last Admin  . heparin lock flush 100 unit/mL  500 Units Intracatheter Once PRN [place], Holli Humbles, NP      . sodium chloride flush (NS) 0.9 % injection 10 mL  10 mL Intravenous PRN Eliezer Bottom, NP   10 mL at 05/19/19 0928  . sodium chloride flush (NS) 0.9 % injection 10 mL  10 mL Intracatheter Once PRN [place], Holli Humbles, NP        PHYSICAL EXAMINATION: ECOG PERFORMANCE STATUS: 2 - Symptomatic, <50% confined to bed  Today's Vitals   06/04/19 0932  BP: 104/76  Pulse: (!) 107  Resp: 18  Temp: 98.6 F (37 C)  TempSrc: Temporal  SpO2: 100%  Weight: 142 lb (64.4 kg)  Height: 5' (1.524 m)  PainSc: 0-No pain   Body mass index is 27.73 kg/m.  Filed Weights    06/04/19 0932  Weight: 142 lb (64.4 kg)    GENERAL: alert, no distress and comfortable SKIN: skin color, texture, turgor are normal, no rashes or significant lesions EYES: conjunctiva are pink and non-injected, sclera clear OROPHARYNX: no exudate, no erythema; lips, buccal mucosa, and tongue normal  NECK: supple, non-tender LUNGS: clear to auscultation with normal breathing effort HEART: regular rate & rhythm and no murmurs and no lower extremity edema ABDOMEN: soft, non-tender, non-distended, normal bowel sounds Musculoskeletal: no cyanosis of digits and no clubbing  PSYCH: alert & oriented x 3, fluent speech  LABORATORY DATA:  I have reviewed the data as listed    Component Value Date/Time   NA 137 06/04/2019 0920   K 3.7 06/04/2019 0920   CL 106 06/04/2019 0920   CO2 23 06/04/2019 0920   GLUCOSE 116 (H) 06/04/2019 0920   BUN 17 06/04/2019 0920   CREATININE 0.90 06/04/2019 0920   CALCIUM 8.9 06/04/2019 0920   PROT 6.3 (L) 06/04/2019 0920   ALBUMIN 3.6 06/04/2019 0920   AST 8 (L) 06/04/2019 0920   ALT 11 06/04/2019 0920   ALKPHOS 73 06/04/2019 0920   BILITOT 0.4 06/04/2019 0920   GFRNONAA >60 06/04/2019 0920   GFRAA >60 06/04/2019 0920    No results found for: SPEP, UPEP  Lab Results  Component Value Date   WBC 0.3 (LL) 06/04/2019   NEUTROABS PENDING 06/04/2019   HGB 7.0 (L) 06/04/2019  HCT 20.4 (L) 06/04/2019   MCV 95.3 06/04/2019   PLT 9 (LL) 06/04/2019      Chemistry      Component Value Date/Time   NA 137 06/04/2019 0920   K 3.7 06/04/2019 0920   CL 106 06/04/2019 0920   CO2 23 06/04/2019 0920   BUN 17 06/04/2019 0920   CREATININE 0.90 06/04/2019 0920      Component Value Date/Time   CALCIUM 8.9 06/04/2019 0920   ALKPHOS 73 06/04/2019 0920   AST 8 (L) 06/04/2019 0920   ALT 11 06/04/2019 0920   BILITOT 0.4 06/04/2019 0920       RADIOGRAPHIC STUDIES: I have personally reviewed the radiological images as listed below and agreed with the  findings in the report. No results found.

## 2019-06-04 NOTE — Patient Instructions (Addendum)
Dehydration, Adult Dehydration is condition in which there is not enough water or other fluids in the body. This happens when a person loses more fluids than he or she takes in. Important body parts cannot work right without the right amount of fluids. Any loss of fluids from the body can cause dehydration. Dehydration can be mild, worse, or very bad. It should be treated right away to keep it from getting very bad. What are the causes? This condition may be caused by:  Conditions that cause loss of water or other fluids, such as: ? Watery poop (diarrhea). ? Vomiting. ? Sweating a lot. ? Peeing (urinating) a lot.  Not drinking enough fluids, especially when you: ? Are ill. ? Are doing things that take a lot of energy to do.  Other illnesses and conditions, such as fever or infection.  Certain medicines, such as medicines that take extra fluid out of the body (diuretics).  Lack of safe drinking water.  Not being able to get enough water and food. What increases the risk? The following factors may make you more likely to develop this condition:  Having a long-term (chronic) illness that has not been treated the right way, such as: ? Diabetes. ? Heart disease. ? Kidney disease.  Being 49 years of age or older.  Having a disability.  Living in a place that is high above the ground or sea (high in altitude). The thinner, dried air causes more fluid loss.  Doing exercises that put stress on your body for a long time. What are the signs or symptoms? Symptoms of dehydration depend on how bad it is. Mild or worse dehydration  Thirst.  Dry lips or dry mouth.  Feeling dizzy or light-headed, especially when you stand up from sitting.  Muscle cramps.  Your body making: ? Dark pee (urine). Pee may be the color of tea. ? Less pee than normal. ? Less tears than normal.  Headache. Very bad dehydration  Changes in skin. Skin may: ? Be cold to the touch (clammy). ? Be blotchy  or pale. ? Not go back to normal right after you lightly pinch it and let it go.  Little or no tears, pee, or sweat.  Changes in vital signs, such as: ? Fast breathing. ? Low blood pressure. ? Weak pulse. ? Pulse that is more than 100 beats a minute when you are sitting still.  Other changes, such as: ? Feeling very thirsty. ? Eyes that look hollow (sunken). ? Cold hands and feet. ? Being mixed up (confused). ? Being very tired (lethargic) or having trouble waking from sleep. ? Short-term weight loss. ? Loss of consciousness. How is this treated? Treatment for this condition depends on how bad it is. Treatment should start right away. Do not wait until your condition gets very bad. Very bad dehydration is an emergency. You will need to go to a hospital.  Mild or worse dehydration can be treated at home. You may be asked to: ? Drink more fluids. ? Drink an oral rehydration solution (ORS). This drink helps get the right amounts of fluids and salts and minerals in the blood (electrolytes).  Very bad dehydration can be treated: ? With fluids through an IV tube. ? By getting normal levels of salts and minerals in your blood. This is often done by giving salts and minerals through a tube. The tube is passed through your nose and into your stomach. ? By treating the root cause. Follow these instructions at  home: Oral rehydration solution If told by your doctor, drink an ORS:  Make an ORS. Use instructions on the package.  Start by drinking small amounts, about  cup (120 mL) every 5-10 minutes.  Slowly drink more until you have had the amount that your doctor said to have. Eating and drinking         Drink enough clear fluid to keep your pee pale yellow. If you were told to drink an ORS, finish the ORS first. Then, start slowly drinking other clear fluids. Drink fluids such as: ? Water. Do not drink only water. Doing that can make the salt (sodium) level in your body get too  low. ? Water from ice chips you suck on. ? Fruit juice that you have added water to (diluted). ? Low-calorie sports drinks.  Eat foods that have the right amounts of salts and minerals, such as: ? Bananas. ? Oranges. ? Potatoes. ? Tomatoes. ? Spinach.  Do not drink alcohol.  Avoid: ? Drinks that have a lot of sugar. These include:  High-calorie sports drinks.  Fruit juice that you did not add water to.  Soda.  Caffeine. ? Foods that are greasy or have a lot of fat or sugar. General instructions  Take over-the-counter and prescription medicines only as told by your doctor.  Do not take salt tablets. Doing that can make the salt level in your body get too high.  Return to your normal activities as told by your doctor. Ask your doctor what activities are safe for you.  Keep all follow-up visits as told by your doctor. This is important. Contact a doctor if:  You have pain in your belly (abdomen) and the pain: ? Gets worse. ? Stays in one place.  You have a rash.  You have a stiff neck.  You get angry or annoyed (irritable) more easily than normal.  You are more tired or have a harder time waking than normal.  You feel: ? Weak or dizzy. ? Very thirsty. Get help right away if you have:  Any symptoms of very bad dehydration.  Symptoms of vomiting, such as: ? You cannot eat or drink without vomiting. ? Your vomiting gets worse or does not go away. ? Your vomit has blood or green stuff in it.  Symptoms that get worse with treatment.  A fever.  A very bad headache.  Problems with peeing or pooping (having a bowel movement), such as: ? Watery poop that gets worse or does not go away. ? Blood in your poop (stool). This may cause poop to look black and tarry. ? Not peeing in 6-8 hours. ? Peeing only a small amount of very dark pee in 6-8 hours.  Trouble breathing. These symptoms may be an emergency. Do not wait to see if the symptoms will go away. Get  medical help right away. Call your local emergency services (911 in the [place] Do not drive yourself to the hospital. Summary  Dehydration is a condition in which there is not enough water or other fluids in the body. This happens when a person loses more fluids than he or she takes in.  Treatment for this condition depends on how bad it is. Treatment should be started right away. Do not wait until your condition gets very bad.  Drink enough clear fluid to keep your pee pale yellow. If you were told to drink an oral rehydration solution (ORS), finish the ORS first. Then, start slowly drinking other clear fluids.  Take over-the-counter and prescription medicines only as told by your doctor.  Get help right away if you have any symptoms of very bad dehydration. This information is not intended to replace advice given to you by your health care provider. Make sure you discuss any questions you have with your health care provider. Document Revised: 11/06/2018 Document Reviewed: 11/06/2018 Elsevier Patient Education  [place].  Thrombocytopenia Thrombocytopenia means that you have a low number of platelets in your blood. Platelets are tiny cells in the blood. When you bleed, they clump together at the cut or injury to stop the bleeding. This is called blood clotting. If you do not have enough platelets, it can cause bleeding problems. Some cases of this condition are mild while others are more severe. What are the causes? This condition may be caused by:  Your body not making enough platelets. This may be caused by: ? Your bone marrow not making blood cells (aplastic anemia). ? Cancer in the bone marrow. ? Certain medicines. ? Infection in the bone marrow. ? Drinking a lot of alcohol.  Your body destroying platelets too quickly. This may be caused by: ? Certain immune diseases. ? Certain medicines. ? Certain blood clotting disorders. ? Certain disorders that are passed from  parent to child (inherited). ? Certain bleeding disorders. ? Pregnancy. ? Having a spleen that is larger than normal. What are the signs or symptoms?  Bleeding that is not normal.  Nosebleeds.  Heavy menstrual periods.  Blood in the pee (urine) or poop (stool).  A purple-like color to the skin (purpura).  Bruising.  A rash that looks like pinpoint, purple-red spots (petechiae). How is this treated?  Treatment of another condition that is causing the low platelet count.  Medicines to help protect your platelets from being destroyed.  A replacement (transfusion) of platelets to stop or prevent bleeding.  Surgery to remove the spleen. Follow these instructions at home: Activity  Avoid activities that could cause you to get hurt or bruised. Follow instructions about how to prevent falls.  Take care not to cut yourself: ? When you shave. ? When you use scissors, needles, knives, or other tools.  Take care not to burn yourself: ? When you use an iron. ? When you cook. General instructions   Check your skin and the inside of your mouth for bruises or blood as told by your doctor.  Check to see if there is blood in your spit (sputum), pee, and poop. Do this as told by your doctor.  Do not drink alcohol.  Take over-the-counter and prescription medicines only as told by your doctor.  Do not take any medicines that have aspirin or NSAIDs in them. These medicines can thin your blood and cause you to bleed.  Tell all of your doctors that you have this condition. Be sure to tell your dentist and eye doctor too. Contact a doctor if:  You have bruises and you do not know why. Get help right away if:  You are bleeding anywhere on your body.  You have blood in your spit, pee, or poop. Summary  Thrombocytopenia means that you have a low number of platelets in your blood.  Platelets are needed for blood clotting.  Symptoms of this condition include bleeding that is not  normal, and bruising.  Take care not to cut or burn yourself. This information is not intended to replace advice given to you by your health care provider. Make sure you discuss any questions  you have with your health care provider. Document Revised: 12/26/2017 Document Reviewed: 12/26/2017 Elsevier Patient Education  [place].

## 2019-06-05 ENCOUNTER — Inpatient Hospital Stay: Payer: Medicaid Other

## 2019-06-05 ENCOUNTER — Other Ambulatory Visit: Payer: Self-pay | Admitting: Hematology

## 2019-06-05 DIAGNOSIS — C3411 Malignant neoplasm of upper lobe, right bronchus or lung: Secondary | ICD-10-CM | POA: Diagnosis not present

## 2019-06-05 DIAGNOSIS — C3490 Malignant neoplasm of unspecified part of unspecified bronchus or lung: Secondary | ICD-10-CM

## 2019-06-05 LAB — BPAM PLATELET PHERESIS
Blood Product Expiration Date: 202102261305
ISSUE DATE / TIME: 202102251308
Unit Type and Rh: 6200

## 2019-06-05 LAB — PREPARE PLATELET PHERESIS: Unit division: 0

## 2019-06-05 NOTE — Patient Instructions (Signed)

## 2019-06-06 LAB — BPAM RBC
Blood Product Expiration Date: 202103292359
Blood Product Expiration Date: 202103292359
ISSUE DATE / TIME: 202102260800
ISSUE DATE / TIME: 202102260800
Unit Type and Rh: 5100
Unit Type and Rh: 5100

## 2019-06-06 LAB — TYPE AND SCREEN
ABO/RH(D): O POS
Antibody Screen: NEGATIVE
Unit division: 0
Unit division: 0

## 2019-06-08 ENCOUNTER — Other Ambulatory Visit: Payer: Self-pay | Admitting: *Deleted

## 2019-06-08 ENCOUNTER — Other Ambulatory Visit: Payer: Medicaid Other

## 2019-06-08 ENCOUNTER — Inpatient Hospital Stay: Payer: Medicaid Other

## 2019-06-08 ENCOUNTER — Inpatient Hospital Stay: Payer: Medicaid Other | Attending: Hematology

## 2019-06-08 ENCOUNTER — Ambulatory Visit: Payer: Medicaid Other

## 2019-06-08 ENCOUNTER — Other Ambulatory Visit: Payer: Self-pay | Admitting: Family

## 2019-06-08 ENCOUNTER — Other Ambulatory Visit: Payer: Self-pay

## 2019-06-08 VITALS — BP 151/95 | HR 100 | Resp 17

## 2019-06-08 DIAGNOSIS — Z7901 Long term (current) use of anticoagulants: Secondary | ICD-10-CM | POA: Diagnosis not present

## 2019-06-08 DIAGNOSIS — Z7952 Long term (current) use of systemic steroids: Secondary | ICD-10-CM | POA: Diagnosis not present

## 2019-06-08 DIAGNOSIS — D696 Thrombocytopenia, unspecified: Secondary | ICD-10-CM

## 2019-06-08 DIAGNOSIS — C3490 Malignant neoplasm of unspecified part of unspecified bronchus or lung: Secondary | ICD-10-CM

## 2019-06-08 DIAGNOSIS — D6959 Other secondary thrombocytopenia: Secondary | ICD-10-CM | POA: Diagnosis not present

## 2019-06-08 DIAGNOSIS — E860 Dehydration: Secondary | ICD-10-CM

## 2019-06-08 DIAGNOSIS — Z791 Long term (current) use of non-steroidal anti-inflammatories (NSAID): Secondary | ICD-10-CM | POA: Insufficient documentation

## 2019-06-08 DIAGNOSIS — D701 Agranulocytosis secondary to cancer chemotherapy: Secondary | ICD-10-CM | POA: Insufficient documentation

## 2019-06-08 DIAGNOSIS — Z7982 Long term (current) use of aspirin: Secondary | ICD-10-CM | POA: Insufficient documentation

## 2019-06-08 DIAGNOSIS — J439 Emphysema, unspecified: Secondary | ICD-10-CM | POA: Diagnosis not present

## 2019-06-08 DIAGNOSIS — D6481 Anemia due to antineoplastic chemotherapy: Secondary | ICD-10-CM | POA: Diagnosis not present

## 2019-06-08 DIAGNOSIS — Z79899 Other long term (current) drug therapy: Secondary | ICD-10-CM | POA: Insufficient documentation

## 2019-06-08 DIAGNOSIS — R079 Chest pain, unspecified: Secondary | ICD-10-CM | POA: Diagnosis not present

## 2019-06-08 DIAGNOSIS — Z86718 Personal history of other venous thrombosis and embolism: Secondary | ICD-10-CM | POA: Diagnosis not present

## 2019-06-08 DIAGNOSIS — T451X5A Adverse effect of antineoplastic and immunosuppressive drugs, initial encounter: Secondary | ICD-10-CM | POA: Insufficient documentation

## 2019-06-08 DIAGNOSIS — G893 Neoplasm related pain (acute) (chronic): Secondary | ICD-10-CM | POA: Diagnosis not present

## 2019-06-08 DIAGNOSIS — D61818 Other pancytopenia: Secondary | ICD-10-CM | POA: Diagnosis not present

## 2019-06-08 DIAGNOSIS — C3411 Malignant neoplasm of upper lobe, right bronchus or lung: Secondary | ICD-10-CM | POA: Insufficient documentation

## 2019-06-08 LAB — CBC WITH DIFFERENTIAL (CANCER CENTER ONLY)
Abs Immature Granulocytes: 0.17 10*3/uL — ABNORMAL HIGH (ref 0.00–0.07)
Basophils Absolute: 0.1 10*3/uL (ref 0.0–0.1)
Basophils Relative: 2 %
Eosinophils Absolute: 0.1 10*3/uL (ref 0.0–0.5)
Eosinophils Relative: 4 %
HCT: 31.2 % — ABNORMAL LOW (ref 36.0–46.0)
Hemoglobin: 10.6 g/dL — ABNORMAL LOW (ref 12.0–15.0)
Immature Granulocytes: 6 %
Lymphocytes Relative: 11 %
Lymphs Abs: 0.3 10*3/uL — ABNORMAL LOW (ref 0.7–4.0)
MCH: 31.5 pg (ref 26.0–34.0)
MCHC: 34 g/dL (ref 30.0–36.0)
MCV: 92.6 fL (ref 80.0–100.0)
Monocytes Absolute: 0.3 10*3/uL (ref 0.1–1.0)
Monocytes Relative: 11 %
Neutro Abs: 1.9 10*3/uL (ref 1.7–7.7)
Neutrophils Relative %: 66 %
Platelet Count: 8 10*3/uL — CL (ref 150–400)
RBC: 3.37 MIL/uL — ABNORMAL LOW (ref 3.87–5.11)
RDW: 17.2 % — ABNORMAL HIGH (ref 11.5–15.5)
WBC Count: 2.9 10*3/uL — ABNORMAL LOW (ref 4.0–10.5)
nRBC: 0 % (ref 0.0–0.2)

## 2019-06-08 LAB — CMP (CANCER CENTER ONLY)
ALT: 9 U/L (ref 0–44)
AST: 10 U/L — ABNORMAL LOW (ref 15–41)
Albumin: 3.7 g/dL (ref 3.5–5.0)
Alkaline Phosphatase: 82 U/L (ref 38–126)
Anion gap: 8 (ref 5–15)
BUN: 8 mg/dL (ref 6–20)
CO2: 26 mmol/L (ref 22–32)
Calcium: 9.3 mg/dL (ref 8.9–10.3)
Chloride: 104 mmol/L (ref 98–111)
Creatinine: 0.92 mg/dL (ref 0.44–1.00)
GFR, Est AFR Am: >60 mL/min (ref >60)
GFR, Estimated: >60 mL/min (ref >60)
Glucose, Bld: 138 mg/dL — ABNORMAL HIGH (ref 70–99)
Potassium: 3.6 mmol/L (ref 3.5–5.1)
Sodium: 138 mmol/L (ref 135–145)
Total Bilirubin: 0.3 mg/dL (ref 0.3–1.2)
Total Protein: 6.6 g/dL (ref 6.5–8.1)

## 2019-06-08 LAB — TYPE AND SCREEN
ABO/RH(D): O POS
Antibody Screen: NEGATIVE

## 2019-06-08 LAB — SAMPLE TO BLOOD BANK

## 2019-06-08 MED ORDER — SODIUM CHLORIDE 0.9% FLUSH
10.0000 mL | Freq: Once | INTRAVENOUS | Status: AC | PRN
  Administered 2019-06-08: 10 mL
  Filled 2019-06-08: qty 10

## 2019-06-08 MED ORDER — HEPARIN SOD (PORK) LOCK FLUSH 100 UNIT/ML IV SOLN
500.0000 [IU] | Freq: Once | INTRAVENOUS | Status: AC | PRN
  Administered 2019-06-08: 500 [IU]
  Filled 2019-06-08: qty 5

## 2019-06-08 MED ORDER — SODIUM CHLORIDE 0.9 % IV SOLN
Freq: Once | INTRAVENOUS | Status: AC
  Filled 2019-06-08: qty 250

## 2019-06-08 NOTE — Patient Instructions (Signed)

## 2019-06-08 NOTE — Patient Instructions (Signed)

## 2019-06-09 ENCOUNTER — Other Ambulatory Visit: Payer: Self-pay | Admitting: Hematology

## 2019-06-09 ENCOUNTER — Encounter: Payer: Self-pay | Admitting: *Deleted

## 2019-06-09 ENCOUNTER — Inpatient Hospital Stay: Payer: Medicaid Other

## 2019-06-09 VITALS — BP 146/82 | HR 102 | Temp 97.1°F | Resp 17

## 2019-06-09 DIAGNOSIS — C3490 Malignant neoplasm of unspecified part of unspecified bronchus or lung: Secondary | ICD-10-CM

## 2019-06-09 DIAGNOSIS — C3411 Malignant neoplasm of upper lobe, right bronchus or lung: Secondary | ICD-10-CM | POA: Diagnosis not present

## 2019-06-09 DIAGNOSIS — D696 Thrombocytopenia, unspecified: Secondary | ICD-10-CM

## 2019-06-09 DIAGNOSIS — D701 Agranulocytosis secondary to cancer chemotherapy: Secondary | ICD-10-CM

## 2019-06-09 MED ORDER — DIPHENHYDRAMINE HCL 25 MG PO CAPS
ORAL_CAPSULE | ORAL | Status: AC
  Filled 2019-06-09: qty 1

## 2019-06-09 MED ORDER — ACETAMINOPHEN 325 MG PO TABS
650.0000 mg | ORAL_TABLET | Freq: Once | ORAL | Status: AC
  Administered 2019-06-09: 650 mg via ORAL

## 2019-06-09 MED ORDER — ACETAMINOPHEN 325 MG PO TABS
ORAL_TABLET | ORAL | Status: AC
  Filled 2019-06-09: qty 2

## 2019-06-09 MED ORDER — SODIUM CHLORIDE 0.9% FLUSH
10.0000 mL | Freq: Once | INTRAVENOUS | Status: AC
  Administered 2019-06-09: 10 mL via INTRAVENOUS
  Filled 2019-06-09: qty 10

## 2019-06-09 MED ORDER — DIPHENHYDRAMINE HCL 25 MG PO CAPS
25.0000 mg | ORAL_CAPSULE | Freq: Once | ORAL | Status: AC
  Administered 2019-06-09: 25 mg via ORAL

## 2019-06-09 MED ORDER — MORPHINE SULFATE ER 15 MG PO TBCR
15.00 | EXTENDED_RELEASE_TABLET | ORAL | Status: DC
Start: 2019-06-09 — End: 2019-06-09

## 2019-06-09 MED ORDER — SODIUM CHLORIDE 0.9 % IV SOLN
INTRAVENOUS | Status: DC
Start: ? — End: 2019-06-09

## 2019-06-09 MED ORDER — HEPARIN SOD (PORK) LOCK FLUSH 100 UNIT/ML IV SOLN
500.0000 [IU] | Freq: Once | INTRAVENOUS | Status: AC
  Administered 2019-06-09: 500 [IU] via INTRAVENOUS
  Filled 2019-06-09: qty 5

## 2019-06-09 MED FILL — levoFLOXacin 750 MG TABS: 750 | 7 days supply | Qty: 7 | Fill #0

## 2019-06-09 MED FILL — AZITHROMYCIN 250 MG TABLET: 250 | 5 days supply | Qty: 6 | Fill #0

## 2019-06-09 MED FILL — PROMETHAZINE W/DM SYRUP: 6.25-15 | 6 days supply | Qty: 118 | Fill #0

## 2019-06-09 NOTE — Patient Instructions (Signed)
Platelet Count Test Why am I having this test? Platelets are specialized cells that help the blood clot. When you get a tissue injury like a cut, platelets gather at the site of the injury to stop the bleeding. You may have a platelet count test:  If you have symptoms that may be related to excess bleeding or delayed blood clotting, such as: ? A rash of pinprick-sized red and purple dots on the skin (petechiae). These are small collections of blood (hemorrhages) in the skin. ? Heavy menstrual bleeding.  To help monitor treatment for: ? Thrombocytopenia. This is a condition in which you have a low platelet count. ? Bone marrow failure. What is being tested? This test measures how many platelets you have within a specific amount (volume) of blood. What kind of sample is taken?  A blood sample is required for this test. It is usually collected by inserting a needle into a blood vessel or by sticking a finger with a small needle. Tell a health care provider about:  Any allergies you have.  All medicines you are taking, including vitamins, herbs, eye drops, creams, and over-the-counter medicines.  Any blood disorders you have.  Any surgeries you have had.  Any medical conditions you have.  Whether you are pregnant or may be pregnant. How are the results reported? Your test results will be reported as a value that indicates how many platelets are in the blood volume. This will be given as platelets per cubic millimeter (mm3) of blood. Your health care provider will compare your results to normal ranges that were established after testing a large group of people (reference ranges). Reference ranges may vary among labs and hospitals. For this test, common reference ranges are:  Adult or elderly: 150,000-400,000/mm3.  Child: 150,000-400,000/mm3.  Infant: 200,000-475,000/mm3.  Premature infant: 100,000-300,000/mm3.  Newborn: 150,000-300,000/mm3. What do the results mean? A result  that is within your reference range is considered normal, meaning that you have a normal amount of platelets in your blood. A result that is higher than your reference range means that you have too many platelets in your blood. This may mean that you have:  Certain types of cancer, such as leukemia or lymphoma.  A condition in which the bone marrow produces excess amounts of all cell types, including platelets (polycythemia vera).  A condition that can occur after surgery to remove the spleen (post-splenectomy syndrome).  Rheumatoid arthritis.  Anemia due to lack of iron (iron-deficiency anemia). A result that is lower than your reference range means that you have too few platelets in your blood. This may mean that you have:  A condition in which the spleen breaks down platelets faster than normal (hypersplenism).  A hemorrhage somewhere in your body.  Low platelet count due to your body's disease-fighting system attacking your platelets (immune thrombocytopenia).  Cancer. Chemotherapy treatments for cancers such as leukemia can also cause low platelet count.  A rare, serious form of thrombocytopenia that causes blood clots (thrombotic thrombocytopenia).  HELLP syndrome, a disorder of pregnancy that causes high blood pressure and other serious problems.  Certain disorders that are passed from parent to child (inherited) that cause a low platelet count.  A condition in which the proteins that control blood clotting are overactive, causing abnormal clotting processes to occur (disseminated intravascular coagulation, DIC).  A disease that causes long-term inflammation and pain in many parts of the body (systemic lupus erythematosus, SLE).  Certain types of anemia, such as pernicious anemia or hemolytic anemia.  Infection. Talk with your health care provider about what your results mean. Questions to ask your health care provider Ask your health care provider, or the department that  is doing the test:  When will my results be ready?  How will I get my results?  What are my treatment options?  What other tests do I need?  What are my next steps? Summary  Platelets are specialized cells that help the blood clot. When you get a tissue injury like a cut, platelets gather at the site of the injury to stop the bleeding.  This test measures how many platelets you have within a specific amount (volume) of blood.  Talk with your health care provider about what your results mean. This information is not intended to replace advice given to you by your health care provider. Make sure you discuss any questions you have with your health care provider. Document Revised: 12/17/2016 Document Reviewed: 12/17/2016 Elsevier Patient Education  [place].

## 2019-06-10 ENCOUNTER — Other Ambulatory Visit: Payer: Medicaid Other

## 2019-06-10 LAB — PREPARE PLATELET PHERESIS
Unit division: 0
Unit division: 0

## 2019-06-10 LAB — BPAM PLATELET PHERESIS
Blood Product Expiration Date: 202103032359
Blood Product Expiration Date: 202103032359
ISSUE DATE / TIME: 202103020756
ISSUE DATE / TIME: 202103020848
Unit Type and Rh: 6200
Unit Type and Rh: 6200

## 2019-06-11 ENCOUNTER — Inpatient Hospital Stay: Payer: Medicaid Other

## 2019-06-11 ENCOUNTER — Other Ambulatory Visit: Payer: Self-pay | Admitting: Hematology

## 2019-06-11 ENCOUNTER — Other Ambulatory Visit: Payer: Self-pay

## 2019-06-11 ENCOUNTER — Other Ambulatory Visit: Payer: Self-pay | Admitting: *Deleted

## 2019-06-11 ENCOUNTER — Encounter: Payer: Self-pay | Admitting: *Deleted

## 2019-06-11 DIAGNOSIS — T451X5A Adverse effect of antineoplastic and immunosuppressive drugs, initial encounter: Secondary | ICD-10-CM

## 2019-06-11 DIAGNOSIS — C3490 Malignant neoplasm of unspecified part of unspecified bronchus or lung: Secondary | ICD-10-CM

## 2019-06-11 DIAGNOSIS — Z95828 Presence of other vascular implants and grafts: Secondary | ICD-10-CM

## 2019-06-11 DIAGNOSIS — D696 Thrombocytopenia, unspecified: Secondary | ICD-10-CM

## 2019-06-11 DIAGNOSIS — R918 Other nonspecific abnormal finding of lung field: Secondary | ICD-10-CM

## 2019-06-11 DIAGNOSIS — D6959 Other secondary thrombocytopenia: Secondary | ICD-10-CM

## 2019-06-11 DIAGNOSIS — C3411 Malignant neoplasm of upper lobe, right bronchus or lung: Secondary | ICD-10-CM | POA: Diagnosis not present

## 2019-06-11 LAB — CBC WITH DIFFERENTIAL (CANCER CENTER ONLY)
Abs Immature Granulocytes: 0.09 10*3/uL — ABNORMAL HIGH (ref 0.00–0.07)
Basophils Absolute: 0 10*3/uL (ref 0.0–0.1)
Basophils Relative: 1 %
Eosinophils Absolute: 0.2 10*3/uL (ref 0.0–0.5)
Eosinophils Relative: 5 %
HCT: 27.9 % — ABNORMAL LOW (ref 36.0–46.0)
Hemoglobin: 9.4 g/dL — ABNORMAL LOW (ref 12.0–15.0)
Immature Granulocytes: 2 %
Lymphocytes Relative: 12 %
Lymphs Abs: 0.5 10*3/uL — ABNORMAL LOW (ref 0.7–4.0)
MCH: 31.3 pg (ref 26.0–34.0)
MCHC: 33.7 g/dL (ref 30.0–36.0)
MCV: 93 fL (ref 80.0–100.0)
Monocytes Absolute: 0.7 10*3/uL (ref 0.1–1.0)
Monocytes Relative: 17 %
Neutro Abs: 2.7 10*3/uL (ref 1.7–7.7)
Neutrophils Relative %: 63 %
Platelet Count: 25 10*3/uL — ABNORMAL LOW (ref 150–400)
RBC: 3 MIL/uL — ABNORMAL LOW (ref 3.87–5.11)
RDW: 16.8 % — ABNORMAL HIGH (ref 11.5–15.5)
WBC Count: 4.3 10*3/uL (ref 4.0–10.5)
nRBC: 0 % (ref 0.0–0.2)

## 2019-06-11 LAB — CMP (CANCER CENTER ONLY)
ALT: 9 U/L (ref 0–44)
AST: 12 U/L — ABNORMAL LOW (ref 15–41)
Albumin: 3.9 g/dL (ref 3.5–5.0)
Alkaline Phosphatase: 82 U/L (ref 38–126)
Anion gap: 10 (ref 5–15)
BUN: 9 mg/dL (ref 6–20)
CO2: 24 mmol/L (ref 22–32)
Calcium: 9.3 mg/dL (ref 8.9–10.3)
Chloride: 104 mmol/L (ref 98–111)
Creatinine: 1.03 mg/dL — ABNORMAL HIGH (ref 0.44–1.00)
GFR, Est AFR Am: >60 mL/min (ref >60)
GFR, Estimated: >60 mL/min (ref >60)
Glucose, Bld: 146 mg/dL — ABNORMAL HIGH (ref 70–99)
Potassium: 3.6 mmol/L (ref 3.5–5.1)
Sodium: 138 mmol/L (ref 135–145)
Total Bilirubin: 0.3 mg/dL (ref 0.3–1.2)
Total Protein: 6.8 g/dL (ref 6.5–8.1)

## 2019-06-11 LAB — TYPE AND SCREEN
ABO/RH(D): O POS
Antibody Screen: NEGATIVE

## 2019-06-11 MED ORDER — SODIUM CHLORIDE 0.9% FLUSH
10.0000 mL | INTRAVENOUS | Status: DC | PRN
  Administered 2019-06-11: 15:00:00 10 mL via INTRAVENOUS
  Filled 2019-06-11: qty 10

## 2019-06-11 MED ORDER — HEPARIN SOD (PORK) LOCK FLUSH 100 UNIT/ML IV SOLN
500.0000 [IU] | Freq: Once | INTRAVENOUS | Status: AC
  Administered 2019-06-11: 500 [IU] via INTRAVENOUS
  Filled 2019-06-11: qty 5

## 2019-06-11 NOTE — Progress Notes (Signed)
Patient looks much better today and she confirms she feels much better. I confirmed with her that she had the needed information for her scans this weekend. She confirmed that she had the information and contrast for her scans on Saturday. Told her to call me if she had any needs or questions.

## 2019-06-12 ENCOUNTER — Other Ambulatory Visit: Payer: Self-pay

## 2019-06-12 ENCOUNTER — Inpatient Hospital Stay: Payer: Medicaid Other

## 2019-06-12 DIAGNOSIS — T451X5A Adverse effect of antineoplastic and immunosuppressive drugs, initial encounter: Secondary | ICD-10-CM

## 2019-06-12 DIAGNOSIS — C3490 Malignant neoplasm of unspecified part of unspecified bronchus or lung: Secondary | ICD-10-CM

## 2019-06-12 DIAGNOSIS — D6959 Other secondary thrombocytopenia: Secondary | ICD-10-CM

## 2019-06-12 DIAGNOSIS — C3411 Malignant neoplasm of upper lobe, right bronchus or lung: Secondary | ICD-10-CM | POA: Diagnosis not present

## 2019-06-12 MED ORDER — HEPARIN SOD (PORK) LOCK FLUSH 100 UNIT/ML IV SOLN
500.0000 [IU] | Freq: Every day | INTRAVENOUS | Status: AC | PRN
  Administered 2019-06-12: 500 [IU]
  Filled 2019-06-12: qty 5

## 2019-06-12 MED ORDER — SODIUM CHLORIDE 0.9% FLUSH
10.0000 mL | INTRAVENOUS | Status: AC | PRN
  Administered 2019-06-12: 10 mL
  Filled 2019-06-12: qty 10

## 2019-06-12 MED ORDER — SODIUM CHLORIDE 0.9% IV SOLUTION
250.0000 mL | Freq: Once | INTRAVENOUS | Status: AC
  Administered 2019-06-12: 250 mL via INTRAVENOUS
  Filled 2019-06-12: qty 250

## 2019-06-12 NOTE — Patient Instructions (Signed)

## 2019-06-13 ENCOUNTER — Ambulatory Visit (HOSPITAL_BASED_OUTPATIENT_CLINIC_OR_DEPARTMENT_OTHER): Payer: Medicaid Other

## 2019-06-13 LAB — BPAM PLATELET PHERESIS
Blood Product Expiration Date: 202103052359
ISSUE DATE / TIME: 202103050752
Unit Type and Rh: 8400

## 2019-06-13 LAB — PREPARE PLATELET PHERESIS: Unit division: 0

## 2019-06-15 ENCOUNTER — Other Ambulatory Visit: Payer: Self-pay | Admitting: Family

## 2019-06-15 ENCOUNTER — Other Ambulatory Visit: Payer: Self-pay | Admitting: *Deleted

## 2019-06-15 ENCOUNTER — Inpatient Hospital Stay: Payer: Medicaid Other

## 2019-06-15 ENCOUNTER — Encounter: Payer: Self-pay | Admitting: *Deleted

## 2019-06-15 ENCOUNTER — Other Ambulatory Visit: Payer: Self-pay

## 2019-06-15 ENCOUNTER — Other Ambulatory Visit: Payer: Self-pay | Admitting: Hematology

## 2019-06-15 VITALS — BP 138/102 | HR 116 | Temp 97.5°F | Resp 20

## 2019-06-15 DIAGNOSIS — R05 Cough: Secondary | ICD-10-CM

## 2019-06-15 DIAGNOSIS — C3411 Malignant neoplasm of upper lobe, right bronchus or lung: Secondary | ICD-10-CM | POA: Diagnosis not present

## 2019-06-15 DIAGNOSIS — C3481 Malignant neoplasm of overlapping sites of right bronchus and lung: Secondary | ICD-10-CM

## 2019-06-15 DIAGNOSIS — C3490 Malignant neoplasm of unspecified part of unspecified bronchus or lung: Secondary | ICD-10-CM

## 2019-06-15 DIAGNOSIS — D696 Thrombocytopenia, unspecified: Secondary | ICD-10-CM

## 2019-06-15 DIAGNOSIS — R059 Cough, unspecified: Secondary | ICD-10-CM

## 2019-06-15 LAB — CBC WITH DIFFERENTIAL (CANCER CENTER ONLY)
Abs Immature Granulocytes: 0.04 10*3/uL (ref 0.00–0.07)
Basophils Absolute: 0 10*3/uL (ref 0.0–0.1)
Basophils Relative: 1 %
Eosinophils Absolute: 0.2 10*3/uL (ref 0.0–0.5)
Eosinophils Relative: 4 %
HCT: 29.9 % — ABNORMAL LOW (ref 36.0–46.0)
Hemoglobin: 9.9 g/dL — ABNORMAL LOW (ref 12.0–15.0)
Immature Granulocytes: 1 %
Lymphocytes Relative: 11 %
Lymphs Abs: 0.6 10*3/uL — ABNORMAL LOW (ref 0.7–4.0)
MCH: 31.1 pg (ref 26.0–34.0)
MCHC: 33.1 g/dL (ref 30.0–36.0)
MCV: 94 fL (ref 80.0–100.0)
Monocytes Absolute: 0.9 10*3/uL (ref 0.1–1.0)
Monocytes Relative: 18 %
Neutro Abs: 3.3 10*3/uL (ref 1.7–7.7)
Neutrophils Relative %: 65 %
Platelet Count: 43 10*3/uL — ABNORMAL LOW (ref 150–400)
RBC: 3.18 MIL/uL — ABNORMAL LOW (ref 3.87–5.11)
RDW: 17.1 % — ABNORMAL HIGH (ref 11.5–15.5)
WBC Count: 5.1 10*3/uL (ref 4.0–10.5)
nRBC: 0 % (ref 0.0–0.2)

## 2019-06-15 LAB — CMP (CANCER CENTER ONLY)
ALT: 11 U/L (ref 0–44)
AST: 17 U/L (ref 15–41)
Albumin: 4 g/dL (ref 3.5–5.0)
Alkaline Phosphatase: 90 U/L (ref 38–126)
Anion gap: 9 (ref 5–15)
BUN: 13 mg/dL (ref 6–20)
CO2: 23 mmol/L (ref 22–32)
Calcium: 9.8 mg/dL (ref 8.9–10.3)
Chloride: 105 mmol/L (ref 98–111)
Creatinine: 0.97 mg/dL (ref 0.44–1.00)
GFR, Est AFR Am: >60 mL/min (ref >60)
GFR, Estimated: >60 mL/min (ref >60)
Glucose, Bld: 94 mg/dL (ref 70–99)
Potassium: 4.2 mmol/L (ref 3.5–5.1)
Sodium: 137 mmol/L (ref 135–145)
Total Bilirubin: 0.3 mg/dL (ref 0.3–1.2)
Total Protein: 7.2 g/dL (ref 6.5–8.1)

## 2019-06-15 MED ORDER — SODIUM CHLORIDE 0.9% FLUSH
10.0000 mL | INTRAVENOUS | Status: DC | PRN
  Administered 2019-06-15: 10 mL via INTRAVENOUS
  Filled 2019-06-15: qty 10

## 2019-06-15 MED ORDER — PROMETHAZINE-DM 6.25-15 MG/5ML PO SYRP: 5.0000 mL | ORAL_SOLUTION | Freq: Four times a day (QID) | ORAL | 1 refills | Status: AC | PRN

## 2019-06-15 MED ORDER — HYDROCODONE-ACETAMINOPHEN 7.5-325 MG PO TABS: 1.0000 | ORAL_TABLET | Freq: Three times a day (TID) | ORAL | 0 refills | Status: DC | PRN

## 2019-06-15 MED ORDER — HEPARIN SOD (PORK) LOCK FLUSH 100 UNIT/ML IV SOLN
500.0000 [IU] | Freq: Once | INTRAVENOUS | Status: AC
  Administered 2019-06-15: 500 [IU] via INTRAVENOUS
  Filled 2019-06-15: qty 5

## 2019-06-15 MED ORDER — HYDROCODONE-ACETAMINOPHEN 7.5-325 MG PO TABS: 1.0000 | ORAL_TABLET | Freq: Three times a day (TID) | ORAL | 0 refills | Status: AC | PRN

## 2019-06-15 MED ORDER — SODIUM CHLORIDE 0.9 % IV SOLN
INTRAVENOUS | Status: DC
  Filled 2019-06-15 (×2): qty 250

## 2019-06-15 MED ORDER — LIDOCAINE-PRILOCAINE 2.5-2.5 % EX CREA: TOPICAL_CREAM | CUTANEOUS | 3 refills | Status: AC

## 2019-06-15 MED FILL — HYDROCODON-APAP 7.5-325: 7.5-325 | 20 days supply | Qty: 60 | Fill #0

## 2019-06-15 MED FILL — PROMETHAZINE W/DM SYRUP: 6.25-15 | 12 days supply | Qty: 240 | Fill #0

## 2019-06-15 MED FILL — LIDOCAINE-PRILOCAINE 2.5-2.: 2.5-2.5 | 30 days supply | Qty: 30 | Fill #0

## 2019-06-15 NOTE — Patient Instructions (Signed)

## 2019-06-15 NOTE — Progress Notes (Signed)
Patient's scans cancelled on Saturday due to insurance authorization pending.   While patient was in the office today, she was rescheduled for this Saturday. Authorization has been confirmed. Patient needed one bottle of contrast to replace one that was opened this past weekend.   Medication refills sent per patient request

## 2019-06-16 ENCOUNTER — Inpatient Hospital Stay: Payer: Medicaid Other

## 2019-06-18 ENCOUNTER — Other Ambulatory Visit: Payer: Self-pay

## 2019-06-18 ENCOUNTER — Encounter: Payer: Self-pay | Admitting: *Deleted

## 2019-06-18 ENCOUNTER — Inpatient Hospital Stay (HOSPITAL_BASED_OUTPATIENT_CLINIC_OR_DEPARTMENT_OTHER): Payer: Medicaid Other | Admitting: Hematology

## 2019-06-18 ENCOUNTER — Inpatient Hospital Stay: Payer: Medicaid Other

## 2019-06-18 ENCOUNTER — Encounter: Payer: Self-pay | Admitting: Hematology

## 2019-06-18 VITALS — BP 124/86 | HR 127 | Temp 96.8°F | Resp 18

## 2019-06-18 DIAGNOSIS — D6481 Anemia due to antineoplastic chemotherapy: Secondary | ICD-10-CM | POA: Diagnosis not present

## 2019-06-18 DIAGNOSIS — Z791 Long term (current) use of non-steroidal anti-inflammatories (NSAID): Secondary | ICD-10-CM

## 2019-06-18 DIAGNOSIS — G893 Neoplasm related pain (acute) (chronic): Secondary | ICD-10-CM

## 2019-06-18 DIAGNOSIS — D696 Thrombocytopenia, unspecified: Secondary | ICD-10-CM

## 2019-06-18 DIAGNOSIS — R918 Other nonspecific abnormal finding of lung field: Secondary | ICD-10-CM

## 2019-06-18 DIAGNOSIS — Z7982 Long term (current) use of aspirin: Secondary | ICD-10-CM

## 2019-06-18 DIAGNOSIS — C3411 Malignant neoplasm of upper lobe, right bronchus or lung: Secondary | ICD-10-CM | POA: Diagnosis not present

## 2019-06-18 DIAGNOSIS — Z95828 Presence of other vascular implants and grafts: Secondary | ICD-10-CM

## 2019-06-18 DIAGNOSIS — T451X5A Adverse effect of antineoplastic and immunosuppressive drugs, initial encounter: Secondary | ICD-10-CM

## 2019-06-18 DIAGNOSIS — Z79899 Other long term (current) drug therapy: Secondary | ICD-10-CM

## 2019-06-18 DIAGNOSIS — D6959 Other secondary thrombocytopenia: Secondary | ICD-10-CM | POA: Diagnosis not present

## 2019-06-18 DIAGNOSIS — J439 Emphysema, unspecified: Secondary | ICD-10-CM

## 2019-06-18 DIAGNOSIS — Z86711 Personal history of pulmonary embolism: Secondary | ICD-10-CM

## 2019-06-18 DIAGNOSIS — D701 Agranulocytosis secondary to cancer chemotherapy: Secondary | ICD-10-CM

## 2019-06-18 DIAGNOSIS — Z7901 Long term (current) use of anticoagulants: Secondary | ICD-10-CM

## 2019-06-18 DIAGNOSIS — C3490 Malignant neoplasm of unspecified part of unspecified bronchus or lung: Secondary | ICD-10-CM

## 2019-06-18 DIAGNOSIS — Z7952 Long term (current) use of systemic steroids: Secondary | ICD-10-CM

## 2019-06-18 LAB — CMP (CANCER CENTER ONLY)
ALT: 10 U/L (ref 0–44)
AST: 16 U/L (ref 15–41)
Albumin: 3.9 g/dL (ref 3.5–5.0)
Alkaline Phosphatase: 90 U/L (ref 38–126)
Anion gap: 12 (ref 5–15)
BUN: 22 mg/dL — ABNORMAL HIGH (ref 6–20)
CO2: 23 mmol/L (ref 22–32)
Calcium: 9.4 mg/dL (ref 8.9–10.3)
Chloride: 103 mmol/L (ref 98–111)
Creatinine: 1.05 mg/dL — ABNORMAL HIGH (ref 0.44–1.00)
GFR, Est AFR Am: >60 mL/min (ref >60)
GFR, Estimated: >60 mL/min (ref >60)
Glucose, Bld: 126 mg/dL — ABNORMAL HIGH (ref 70–99)
Potassium: 3.8 mmol/L (ref 3.5–5.1)
Sodium: 138 mmol/L (ref 135–145)
Total Bilirubin: 0.3 mg/dL (ref 0.3–1.2)
Total Protein: 6.9 g/dL (ref 6.5–8.1)

## 2019-06-18 LAB — CBC WITH DIFFERENTIAL (CANCER CENTER ONLY)
Abs Immature Granulocytes: 0.03 10*3/uL (ref 0.00–0.07)
Basophils Absolute: 0 10*3/uL (ref 0.0–0.1)
Basophils Relative: 1 %
Eosinophils Absolute: 0.2 10*3/uL (ref 0.0–0.5)
Eosinophils Relative: 6 %
HCT: 28.6 % — ABNORMAL LOW (ref 36.0–46.0)
Hemoglobin: 9.6 g/dL — ABNORMAL LOW (ref 12.0–15.0)
Immature Granulocytes: 1 %
Lymphocytes Relative: 18 %
Lymphs Abs: 0.5 10*3/uL — ABNORMAL LOW (ref 0.7–4.0)
MCH: 31.7 pg (ref 26.0–34.0)
MCHC: 33.6 g/dL (ref 30.0–36.0)
MCV: 94.4 fL (ref 80.0–100.0)
Monocytes Absolute: 0.4 10*3/uL (ref 0.1–1.0)
Monocytes Relative: 16 %
Neutro Abs: 1.6 10*3/uL — ABNORMAL LOW (ref 1.7–7.7)
Neutrophils Relative %: 58 %
Platelet Count: 38 10*3/uL — ABNORMAL LOW (ref 150–400)
RBC: 3.03 MIL/uL — ABNORMAL LOW (ref 3.87–5.11)
RDW: 17.3 % — ABNORMAL HIGH (ref 11.5–15.5)
WBC Count: 2.7 10*3/uL — ABNORMAL LOW (ref 4.0–10.5)
nRBC: 0 % (ref 0.0–0.2)

## 2019-06-18 LAB — SAMPLE TO BLOOD BANK

## 2019-06-18 MED ORDER — HEPARIN SOD (PORK) LOCK FLUSH 100 UNIT/ML IV SOLN
500.0000 [IU] | Freq: Once | INTRAVENOUS | Status: AC
  Administered 2019-06-18: 500 [IU] via INTRAVENOUS
  Filled 2019-06-18: qty 5

## 2019-06-18 MED ORDER — SODIUM CHLORIDE 0.9% FLUSH
10.0000 mL | Freq: Once | INTRAVENOUS | Status: AC
  Administered 2019-06-18: 14:00:00 10 mL via INTRAVENOUS
  Filled 2019-06-18: qty 10

## 2019-06-18 NOTE — Patient Instructions (Signed)

## 2019-06-18 NOTE — Progress Notes (Signed)
[facility] OFFICE PROGRESS NOTE  Patient Care Team: Patient, No Pcp Per as PCP - General (General Practice) [name], MD as Medical Oncologist (Oncology) Cordelia Poche, RN as Oncology Nurse Navigator  HEME/ONC OVERVIEW: 1. Limited stage small cell lung cancer, Stage IIIA ([id number]) -01/2019:   Large RUL mass w/ thoracic adenopathy and a second mass/consolidation in the RUL periphery on CTA chest  FDG-avid RUL mass with suspected mediastinal invasion, R paratracheal adenopathy; indeterminate consolidation in R apex (post-obstructive pneumonitis vs. satellite lesion in the same lobe)  MRI brain negative for malignancy -02/2019: bronchoscopy w/ biopsy of the RUL mass and 7R LN, path showed small cell lung cancer  -02/2019 - 05/2019: concurrent chemoRT with cisplatin/etoposide   2. 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 Port placement in 02/2019   TREATMENT REGIMEN:  02/24/2019 - 05/29/2019: concurrent chemoradiation with cisplatin/etoposide x 4 cycles   03/2019 - present: Eliquis '5mg'$  BID   ASSESSMENT & PLAN:   Limited stage small cell lung cancer, Stage IIIA ([id number]) -S/p 4 cyclse of cisplatin/etoposide; treatment complicated by severe pancytopenia and neutropenic fever x 2  -I have ordered CT chest, abdomen/pelvis w/ contrast to assess treatment response, as well as MRI brain w/ contrast, currently scheduled on 06/20/2019 -If improving disease, then I will reach out to her radiation oncologist at [facility] regarding prophylactic WBRT  -PRN anti-emetics: Zofran, Compazine, dexamethasone and Ativan   Chemotherapy-associated anemia -Secondary to chemotherapy -Hgb 9.6 today, stable  -Patient denies any symptom of bleeding -We will monitor it for now   Chemotherapy-associated thrombocytopenia -Secondary to chemotherapy -Plts 38k today, stable  -Patient denies any symptoms of bleeding or excess bruising, such as  epistaxis, hematochezia, melena, or hematuria -Hold Eliquis until plt count improves above 50k   Chemotherapy-associated leukopenia -Secondary to chemotherapy -WBC 2.7k w/ ANC ~1600, stable  -Patient denies any symptoms of infection -We will monitor it for now   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  The total time spent in the encounter was 42 minutes, including face-to-face time with the patient, review of various tests results, order additional studies/medications, documentation, and coordination of care plan.   All questions were answered. The patient knows to call the clinic with any problems, questions or concerns. No barriers to learning was detected.  Return in 2 weeks for labs, port flush, imaging results and clinic appt.   [name], MD 3/11/20212:15 PM  CHIEF COMPLAINT: "I am feeling better"  INTERVAL HISTORY: [name] returns clinic for follow-up of limited stage small cell lung cancer s/p definitive chemoradiation.  Patient reports that she still has some dry cough, usually at night, for which she takes Robitussin with relief.  The cough is mostly dry, with occasional scant clear sputum production.  She also has mild shortness of breath with exertion, but it is overall improving.  She is still taking MS-Contin BID with occasional Norco for breakthrough pain.  Her appetite is improving.  She denies any other complaint today.  REVIEW OF SYSTEMS:   Constitutional: ( - ) fevers, ( - )  chills , ( - )  night sweats Eyes: ( - ) blurriness of vision, ( - ) double vision, ( - ) watery eyes Ears, nose, mouth, throat, and face: (  - ) mucositis, ( - ) sore throat Respiratory: ( + ) cough, ( + ) dyspnea, ( - ) wheezes Cardiovascular: ( - ) palpitation, ( - ) chest discomfort, ( - ) lower extremity swelling Gastrointestinal:  ( - ) nausea, ( - ) heartburn, ( - ) change in bowel habits Skin: ( - ) abnormal skin rashes Lymphatics: ( - ) new lymphadenopathy, ( - ) easy bruising Neurological: ( - ) numbness, ( - ) tingling, ( - ) new weaknesses Behavioral/Psych: ( - ) mood change, ( - ) new changes  All other systems were reviewed with the patient and are negative.  SUMMARY OF ONCOLOGIC HISTORY: Oncology History  Small cell lung cancer ([place])  01/28/2019 Initial Diagnosis   Lung cancer ([place])   01/28/2019 Imaging   CTA chest: IMPRESSION: 1. No demonstrable pulmonary embolus. No thoracic aortic aneurysm or dissection.   2. Mass arising in the right upper lobe extending into the right hilum measuring 3.1 x 2.5 cm. Questionable second mass in the right upper lobe more peripherally and anteriorly measuring 2.4 x 1.7 cm versus consolidation in this area. There is adenopathy in the right hilum and right paratracheal regions. Neoplastic involvement is felt to be present. It may be prudent to correlate with nuclear medicine PET study to further characterize.   3. Underlying emphysematous change with areas of fibrosis throughout the periphery of each lung. Fibrosis is somewhat evenly distributed between upper and lower lobe regions. There is atelectatic change in the lung bases, more on the right than on the left.   Emphysema (ICD10-J43.9).   02/06/2019 Imaging   PET:   IMPRESSION: Right upper lobe perihilar lung mass is intensely hypermetabolic compatible with primary bronchogenic carcinoma. Suspect mediastinal invasion with hypermetabolic right paratracheal adenopathy. Assuming a non-small cell neoplasm this is favored to represent a T4N1M0 lesion or stage IIIa disease.   Indeterminate nodular area of  subpleural consolidation in with thin the lateral right apex has an SUV max of 4.96. This may represent an area of postobstructive pneumonitis versus satellite lesion in same lobe.   Aortic Atherosclerosis (ICD10-I70.0) and Emphysema (ICD10-J43.9).   02/07/2019 Imaging   MRI brain: IMPRESSION: 1. Normal MRI appearance the brain. No evidence for metastatic disease to the brain. 2. Left mastoid effusion without obstructing nasopharyngeal lesion.   02/12/2019 Pathology Results   Specimen Submitted:  A. LUNG, RIGHT UPPER LOBE, LAVAGE:    FINAL MICROSCOPIC DIAGNOSIS:  - Atypical cells present  - See comment   SPECIMEN ADEQUACY:  Satisfactory for evaluation   DIAGNOSTIC COMMENTS:  Mixed acute and chronic inflammation is present.  There are rare atypical cells present on the heme slides only.  These  are not seen on the cytology smears.    02/12/2019 Pathology Results   FINAL MICROSCOPIC DIAGNOSIS:   A.   MASS, RIGHT HILAR, FINE NEEDLE ASPIRATION:  -  Malignant cells consistent with small cell carcinoma  -  See comment   B. LYMPH NODE, 7 NODE, FINE NEEDLE ASPIRATION:  -  No malignant cells identified   COMMENT:   A.  Immunohistochemistry performed on the cell block shows the  neoplastic cells are positive for TTF-1, CD56, and synaptophysin but  negative for chromogranin, cytokeratin 5 6 and p40.  Overall, the  combined morphology and immunophenotype are consistent with small cell  carcinoma.  02/18/2019 Cancer Staging   Staging form: Lung, AJCC 8th Edition - Clinical: Stage IIIA (cT4, cN1, cM0) - Signed by [name], MD on 02/18/2019   02/24/2019 -  Chemotherapy   The patient had palonosetron (ALOXI) injection 0.25 mg, 0.25 mg, Intravenous,  Once, 4 of 4 cycles Administration: 0.25 mg (02/24/2019), 0.25 mg (03/24/2019), 0.25 mg (04/21/2019), 0.25 mg (05/26/2019) pegfilgrastim-cbqv (UDENYCA) injection 6 mg, 6 mg, Subcutaneous, Once, 3 of 3 cycles Administration: 6 mg  (03/27/2019), 6 mg (04/24/2019), 6 mg (05/29/2019) CISplatin (PLATINOL) 125 mg in sodium chloride 0.9 % 500 mL chemo infusion, 75 mg/m2 = 125 mg (100 % of original dose 75 mg/m2), Intravenous,  Once, 4 of 4 cycles Dose modification: 75 mg/m2 (original dose 75 mg/m2, Cycle 1, Reason: Provider Judgment), 60 mg/m2 (original dose 75 mg/m2, Cycle 4, Reason: Dose not tolerated), 55 mg/m2 (original dose 75 mg/m2, Cycle 4, Reason: Treatment Parameters Not Met) Administration: 125 mg (02/24/2019), 125 mg (03/24/2019), 125 mg (04/21/2019), 91 mg (05/26/2019) etoposide (VEPESID) 170 mg in sodium chloride 0.9 % 500 mL chemo infusion, 100 mg/m2 = 170 mg, Intravenous,  Once, 4 of 4 cycles Dose modification: 80 mg/m2 (original dose 100 mg/m2, Cycle 2, Reason: Dose not tolerated), 100 mg/m2 (original dose 100 mg/m2, Cycle 2, Reason: Provider Judgment), 80 mg/m2 (original dose 100 mg/m2, Cycle 3, Reason: Treatment Parameters Not Met) Administration: 170 mg (02/24/2019), 170 mg (02/25/2019), 170 mg (02/26/2019), 170 mg (03/24/2019), 170 mg (03/25/2019), 170 mg (03/26/2019), 130 mg (04/21/2019), 130 mg (04/22/2019), 130 mg (04/23/2019), 130 mg (05/26/2019), 130 mg (05/27/2019), 130 mg (05/28/2019) fosaprepitant (EMEND) 150 mg, dexamethasone (DECADRON) 12 mg in sodium chloride 0.9 % 145 mL IVPB, , Intravenous,  Once, 4 of 4 cycles Administration:  (02/24/2019),  (03/24/2019),  (04/21/2019),  (05/26/2019)  for chemotherapy treatment.      I have reviewed the past medical history, past surgical history, social history and family history with the patient and they are unchanged from previous note.  ALLERGIES:  has No Known Allergies.  MEDICATIONS:  Current Outpatient Medications  Medication Sig Dispense Refill  . albuterol (PROVENTIL HFA;VENTOLIN HFA) 108 (90 Base) MCG/ACT inhaler Inhale 1-2 puffs into the lungs every 4 (four) hours as needed for wheezing or shortness of breath.     Marland Kitchen apixaban (ELIQUIS) 5 MG TABS tablet Take 1  tablet (5 mg total) by mouth 2 (two) times daily. 60 tablet 11  . aspirin EC 325 MG tablet Take by mouth.    . benzonatate (TESSALON) 100 MG capsule Take 1 capsule (100 mg total) by mouth 3 (three) times daily as needed for cough. 40 capsule 2  . calcium-vitamin D (OSCAL WITH D) 500-200 MG-UNIT TABS tablet Take 1 tablet by mouth every morning.    Marland Kitchen dexamethasone (DECADRON) 4 MG tablet Take 2 tablets two times a day for 1 day on day 4 after cisplatin chemotherapy. Take with food. 30 tablet 1  . diphenoxylate-atropine (LOMOTIL) 2.5-0.025 MG tablet Take 2 tablets by mouth 4 (four) times daily as needed for diarrhea or loose stools. 30 tablet 0  . fluconazole (DIFLUCAN) 100 MG tablet Take 1 tablet (100 mg total) by mouth daily. 30 tablet 1  . guaiFENesin-dextromethorphan (ROBITUSSIN DM) 100-10 MG/5ML syrup Take 10 mLs by mouth every 6 (six) hours as needed for cough. 120 mL 3  . HYDROcodone-acetaminophen (NORCO) 7.5-325 MG tablet Take 1 tablet by mouth every 8 (eight) hours as needed for moderate pain. 60 tablet 0  . ibuprofen (ADVIL) 800 MG tablet Take  1 tablet (800 mg total) by mouth 3 (three) times daily. (Patient taking differently: Take 800 mg by mouth every 8 (eight) hours as needed (pain). ) 21 tablet 0  . levofloxacin (LEVAQUIN) 500 MG tablet Take 1 tablet (500 mg total) by mouth daily. 30 tablet 1  . lidocaine-prilocaine (EMLA) cream Apply to affected area once 30 g 3  . LORazepam (ATIVAN) 0.5 MG tablet Take 1 tablet (0.5 mg total) by mouth every 6 (six) hours as needed (Nausea or vomiting). 30 tablet 0  . magnesium oxide (MAG-OX) 400 MG tablet Take 1 tablet by mouth daily.    Marland Kitchen nystatin (MYCOSTATIN) 100000 UNIT/ML suspension Take 5 mLs (500,000 Units total) by mouth 4 (four) times daily. Swish and swallow 60 mL 2  . ondansetron (ZOFRAN) 8 MG tablet Take 1 tablet (8 mg total) by mouth 2 (two) times daily as needed. Start on the third day after cisplatin chemotherapy. 30 tablet 2  . pantoprazole  (PROTONIX) 40 MG tablet Take 1 tablet (40 mg total) by mouth daily. 30 tablet 3  . prochlorperazine (COMPAZINE) 10 MG tablet Take 1 tablet (10 mg total) by mouth every 6 (six) hours as needed (Nausea or vomiting). 30 tablet 1  . promethazine-dextromethorphan (PROMETHAZINE-DM) 6.25-15 MG/5ML syrup Take 5 mLs by mouth 4 (four) times daily as needed for cough. 240 mL 1  . sucralfate (CARAFATE) 1 GM/10ML suspension Take 10 mLs (1 g total) by mouth 4 (four) times daily -  with meals and at bedtime. 420 mL 2   No current facility-administered medications for this visit.   Facility-Administered Medications Ordered in Other Visits  Medication Dose Route Frequency Provider Last Rate Last Admin  . sodium chloride flush (NS) 0.9 % injection 10 mL  10 mL Intravenous PRN [place], Holli Humbles, NP   10 mL at 05/19/19 [id number]    PHYSICAL EXAMINATION: ECOG PERFORMANCE STATUS: 1 - Symptomatic but completely ambulatory  There were no vitals filed for this visit. There is no height or weight on file to calculate BMI.  There were no vitals filed for this visit.  GENERAL: alert, no distress and comfortable SKIN: skin color, texture, turgor are normal, no rashes or significant lesions EYES: conjunctiva are pink and non-injected, sclera clear OROPHARYNX: no exudate, no erythema; lips, buccal mucosa, and tongue normal  NECK: supple, non-tender LUNGS: clear to auscultation with normal breathing effort HEART: regular rate & rhythm and no murmurs and no lower extremity edema ABDOMEN: soft, non-tender, non-distended, normal bowel sounds Musculoskeletal: no cyanosis of digits and no clubbing  PSYCH: alert & oriented x 3, fluent speech  LABORATORY DATA:  I have reviewed the data as listed    Component Value Date/Time   NA 138 06/18/2019 1330   K 3.8 06/18/2019 1330   CL 103 06/18/2019 1330   CO2 23 06/18/2019 1330   GLUCOSE 126 (H) 06/18/2019 1330   BUN 22 (H) 06/18/2019 1330   CREATININE 1.05 (H) 06/18/2019  1330   CALCIUM 9.4 06/18/2019 1330   PROT 6.9 06/18/2019 1330   ALBUMIN 3.9 06/18/2019 1330   AST 16 06/18/2019 1330   ALT 10 06/18/2019 1330   ALKPHOS 90 06/18/2019 1330   BILITOT 0.3 06/18/2019 1330   GFRNONAA >60 06/18/2019 1330   GFRAA >60 06/18/2019 1330    No results found for: SPEP, UPEP  Lab Results  Component Value Date   WBC 2.7 (L) 06/18/2019   NEUTROABS 1.6 (L) 06/18/2019   HGB 9.6 (L) 06/18/2019   HCT 28.6 (  L) 06/18/2019   MCV 94.4 06/18/2019   PLT 38 (L) 06/18/2019      Chemistry      Component Value Date/Time   NA 138 06/18/2019 1330   K 3.8 06/18/2019 1330   CL 103 06/18/2019 1330   CO2 23 06/18/2019 1330   BUN 22 (H) 06/18/2019 1330   CREATININE 1.05 (H) 06/18/2019 1330      Component Value Date/Time   CALCIUM 9.4 06/18/2019 1330   ALKPHOS 90 06/18/2019 1330   AST 16 06/18/2019 1330   ALT 10 06/18/2019 1330   BILITOT 0.3 06/18/2019 1330       RADIOGRAPHIC STUDIES: I have personally reviewed the radiological images as listed below and agreed with the findings in the report. No results found.

## 2019-06-19 ENCOUNTER — Other Ambulatory Visit: Payer: Self-pay | Admitting: Hematology

## 2019-06-19 ENCOUNTER — Inpatient Hospital Stay: Payer: Medicaid Other

## 2019-06-20 ENCOUNTER — Other Ambulatory Visit: Payer: Self-pay

## 2019-06-20 ENCOUNTER — Ambulatory Visit (HOSPITAL_BASED_OUTPATIENT_CLINIC_OR_DEPARTMENT_OTHER)
Admission: RE | Admit: 2019-06-20 | Discharge: 2019-06-20 | Disposition: A | Discharge status: Home / Self Care | Payer: Medicaid Other | Source: Ambulatory Visit | Attending: Hematology | Admitting: Hematology

## 2019-06-20 ENCOUNTER — Encounter (HOSPITAL_BASED_OUTPATIENT_CLINIC_OR_DEPARTMENT_OTHER): Payer: Self-pay

## 2019-06-20 DIAGNOSIS — C3490 Malignant neoplasm of unspecified part of unspecified bronchus or lung: Secondary | ICD-10-CM

## 2019-06-20 MED ORDER — GADOBUTROL 1 MMOL/ML IV SOLN
6.4000 mL | Freq: Once | INTRAVENOUS | Status: AC | PRN
  Administered 2019-06-20: 16:00:00 6.4 mL via INTRAVENOUS

## 2019-06-20 MED ORDER — IOHEXOL 300 MG/ML  SOLN
100.0000 mL | Freq: Once | INTRAMUSCULAR | Status: AC | PRN
  Administered 2019-06-20: 100 mL via INTRAVENOUS

## 2019-06-22 ENCOUNTER — Encounter: Payer: Self-pay | Admitting: *Deleted

## 2019-06-22 ENCOUNTER — Other Ambulatory Visit: Payer: Self-pay | Admitting: *Deleted

## 2019-06-22 DIAGNOSIS — G893 Neoplasm related pain (acute) (chronic): Secondary | ICD-10-CM

## 2019-06-22 DIAGNOSIS — R918 Other nonspecific abnormal finding of lung field: Secondary | ICD-10-CM

## 2019-06-22 DIAGNOSIS — C3490 Malignant neoplasm of unspecified part of unspecified bronchus or lung: Secondary | ICD-10-CM

## 2019-06-22 MED ORDER — LORAZEPAM 0.5 MG PO TABS: 0.5000 mg | ORAL_TABLET | Freq: Four times a day (QID) | ORAL | 0 refills | Status: AC | PRN

## 2019-06-22 MED ORDER — MORPHINE SULFATE ER 15 MG PO TBCR: 15.0000 mg | EXTENDED_RELEASE_TABLET | Freq: Two times a day (BID) | ORAL | 0 refills | Status: AC

## 2019-06-22 MED FILL — levoFLOXacin 500 MG TABS: 500 | 30 days supply | Qty: 30 | Fill #1

## 2019-06-22 MED FILL — LORazepam 0.5 MG TABS: 0.5 | 8 days supply | Qty: 30 | Fill #0

## 2019-06-22 MED FILL — FLUCONAZOLE 100 MG TABLET: 100 | 30 days supply | Qty: 30 | Fill #1

## 2019-06-22 MED FILL — MORPHINE SULF ER 15 MG TAB: 15 | 30 days supply | Qty: 60 | Fill #0

## 2019-07-03 ENCOUNTER — Inpatient Hospital Stay (HOSPITAL_BASED_OUTPATIENT_CLINIC_OR_DEPARTMENT_OTHER): Payer: Medicaid Other | Admitting: Hematology

## 2019-07-03 ENCOUNTER — Inpatient Hospital Stay: Payer: Medicaid Other

## 2019-07-03 ENCOUNTER — Encounter: Payer: Self-pay | Admitting: Hematology

## 2019-07-03 ENCOUNTER — Encounter: Payer: Self-pay | Admitting: *Deleted

## 2019-07-03 ENCOUNTER — Other Ambulatory Visit: Payer: Self-pay

## 2019-07-03 VITALS — BP 104/81 | HR 100 | Temp 97.5°F | Resp 19 | Ht 60.0 in | Wt 140.0 lb

## 2019-07-03 DIAGNOSIS — C3490 Malignant neoplasm of unspecified part of unspecified bronchus or lung: Secondary | ICD-10-CM | POA: Diagnosis not present

## 2019-07-03 DIAGNOSIS — D6959 Other secondary thrombocytopenia: Secondary | ICD-10-CM

## 2019-07-03 DIAGNOSIS — D6481 Anemia due to antineoplastic chemotherapy: Secondary | ICD-10-CM | POA: Diagnosis not present

## 2019-07-03 DIAGNOSIS — G893 Neoplasm related pain (acute) (chronic): Secondary | ICD-10-CM | POA: Diagnosis not present

## 2019-07-03 DIAGNOSIS — C3411 Malignant neoplasm of upper lobe, right bronchus or lung: Secondary | ICD-10-CM | POA: Diagnosis not present

## 2019-07-03 DIAGNOSIS — T451X5A Adverse effect of antineoplastic and immunosuppressive drugs, initial encounter: Secondary | ICD-10-CM

## 2019-07-03 LAB — CBC WITH DIFFERENTIAL (CANCER CENTER ONLY)
Abs Immature Granulocytes: 0.04 10*3/uL (ref 0.00–0.07)
Basophils Absolute: 0 10*3/uL (ref 0.0–0.1)
Basophils Relative: 1 %
Eosinophils Absolute: 0.2 10*3/uL (ref 0.0–0.5)
Eosinophils Relative: 4 %
HCT: 30.6 % — ABNORMAL LOW (ref 36.0–46.0)
Hemoglobin: 10.2 g/dL — ABNORMAL LOW (ref 12.0–15.0)
Immature Granulocytes: 1 %
Lymphocytes Relative: 13 %
Lymphs Abs: 0.6 10*3/uL — ABNORMAL LOW (ref 0.7–4.0)
MCH: 31.8 pg (ref 26.0–34.0)
MCHC: 33.3 g/dL (ref 30.0–36.0)
MCV: 95.3 fL (ref 80.0–100.0)
Monocytes Absolute: 0.3 10*3/uL (ref 0.1–1.0)
Monocytes Relative: 7 %
Neutro Abs: 3.3 10*3/uL (ref 1.7–7.7)
Neutrophils Relative %: 74 %
Platelet Count: 78 10*3/uL — ABNORMAL LOW (ref 150–400)
RBC: 3.21 MIL/uL — ABNORMAL LOW (ref 3.87–5.11)
RDW: 17.2 % — ABNORMAL HIGH (ref 11.5–15.5)
WBC Count: 4.4 10*3/uL (ref 4.0–10.5)
nRBC: 0 % (ref 0.0–0.2)

## 2019-07-03 LAB — CMP (CANCER CENTER ONLY)
ALT: 11 U/L (ref 0–44)
AST: 17 U/L (ref 15–41)
Albumin: 3.9 g/dL (ref 3.5–5.0)
Alkaline Phosphatase: 83 U/L (ref 38–126)
Anion gap: 10 (ref 5–15)
BUN: 21 mg/dL — ABNORMAL HIGH (ref 6–20)
CO2: 23 mmol/L (ref 22–32)
Calcium: 9.4 mg/dL (ref 8.9–10.3)
Chloride: 105 mmol/L (ref 98–111)
Creatinine: 1.17 mg/dL — ABNORMAL HIGH (ref 0.44–1.00)
GFR, Est AFR Am: >60 mL/min (ref >60)
GFR, Estimated: 55 mL/min — ABNORMAL LOW (ref >60)
Glucose, Bld: 135 mg/dL — ABNORMAL HIGH (ref 70–99)
Potassium: 4.1 mmol/L (ref 3.5–5.1)
Sodium: 138 mmol/L (ref 135–145)
Total Bilirubin: 0.3 mg/dL (ref 0.3–1.2)
Total Protein: 6.8 g/dL (ref 6.5–8.1)

## 2019-07-03 NOTE — Patient Instructions (Signed)

## 2019-07-03 NOTE — Progress Notes (Signed)
M[facility] OFFICE PROGRESS NOTE  Patient Care Team: Patient, No Pcp Per as PCP - General (General Practice) Marcia Men, MD as Medical Oncologist (Oncology) Marcia Poche, RN as Oncology Nurse Navigator  HEME/ONC OVERVIEW: 1. Limited stage small cell lung cancer, Stage IIIA ([id number]) -01/2019:   Large RUL mass w/ thoracic adenopathy and a second mass/consolidation in the RUL periphery on CTA chest  FDG-avid RUL mass with suspected mediastinal invasion, R paratracheal adenopathy; indeterminate consolidation in R apex (post-obstructive pneumonitis vs. satellite lesion in the same lobe)  MRI brain negative for malignancy -02/2019: bronchoscopy w/ biopsy of the RUL mass and 7R LN, path showed small cell lung cancer  -02/2019 - 05/2019: concurrent chemoRT with cisplatin/etoposide   06/2019: improving disease in the lung and resolution of thoracic adenopathy on CT; MRI brain negative  -On surveillance   2. 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  In summary, CT showed improving R lung nodule, but her approximately 2.0 x 1.2 cm, and resolution of thoracic adenopathy.  There was no evidence of new metastatic disease.  MRI brain was negative for malignancy. -I reviewed the imaging results in detail with the patient -I also discussed the case in detail with Dr. Malva Limes at [facility], who discussed with the patient the rationale for prophylactic whole brain radiation, and the  patient is currently scheduled to start WBRT on 3/29; plan for 10 fractions over 2 weekss -I reviewed NCCN guidelines detail the patient, including the surveillance plan -I have ordered CT CAP w/ contrast and MRI brain w/ contrast prior to the next visit  -I also counseled the patient on the importance of abstinence from tobacco products -Surveillance plan:  CT CAP w/ contrast q259month during Year 1-2, then q653monthduring Year 3, then annually  MRI brain w/ contrast q3-59m45monthuring year 1, then q6mo61monthring year 2  Chemotherapy-associated anemia -Secondary to chemotherapy -Hgb 10.2 today, improving  -Patient denies any symptom of bleeding -We will repeat labs in 6 weeks to monitor for improvement   Chemotherapy-associated thrombocytopenia -Secondary to chemotherapy -Plts 78k today, imrpvoing -Patient denies any symptoms of bleeding or excess bruising, such as epistaxis, hematochezia, melena, or hematuria -We will repeat labs in 6 weeks to monitor for improvement   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 -On Eliquis since early 03/2019 -With improvement in thrombocytopenia, I have instructed the patient to resume Eliquis '5mg'$  BID -Once the port is removed, we will plan for 3 months of therapeutic anticoagulation, and if no evidence of recurrent DVT, she can stop afterwards  Cancer-related pain -Likely due to pleural invasion by malignancy -Pain overall improving; currently on MS-Contin '15mg'$  BID -I encouraged the patient to start weaning opioid medication over the next 2-3 months as tolerated to avoid opioid dependence -Patient expressed understanding, and agreed with the plan  Orders Placed This Encounter  Procedures  . IR REMOVAL TUN ACCESS W/ PORT W/O FL MOD SED    Standing Status:   Future    Standing Expiration Date:  09/01/2020    Order Specific Question:   Reason for exam:    Answer:   Completed chemo    Order Specific Question:   Preferred Imaging Location?    Answer:    [facility] [place]    Order Specific Question:   Is the patient pregnant?    Answer:   No  . CT CHEST W CONTRAST    Standing Status:   Future    Standing Expiration Date:   07/02/2020    Scheduling Instructions:     Pls schedule prior to 10/02/2019    Order Specific Question:   ** REASON FOR EXAM (FREE TEXT)    Answer:   Surveillance    Order Specific Question:   If indicated for the ordered procedure, I authorize the administration of contrast media per Radiology protocol    Answer:   Yes    Order Specific Question:   Preferred imaging location?    Answer:   [facility] Specific Question:   Radiology Contrast Protocol - do NOT remove file path    Answer:   \\[email or web address]\CTProtocols.pdf    Order Specific Question:   Is patient pregnant?    Answer:   No  . CT ABDOMEN PELVIS W CONTRAST    Standing Status:   Future    Standing Expiration Date:   07/02/2020    Scheduling Instructions:     Pls schedule prior to 10/02/2019    Order Specific Question:   ** REASON FOR EXAM (FREE TEXT)    Answer:   Surveillance    Order Specific Question:   If indicated for the ordered procedure, I authorize the administration of contrast media per Radiology protocol    Answer:   Yes    Order Specific Question:   Preferred imaging location?    Answer:   [facility] Specific Question:   Is Oral Contrast requested for this exam?    Answer:   Yes, Per Radiology protocol    Order Specific Question:   Radiology Contrast Protocol - do NOT remove file path    Answer:   \\[email or web address]\CTProtocols.[email or web address]    Order Specific Question:   Is patient pregnant?    Answer:   No  . MR BRAIN W WO CONTRAST    Standing Status:   Future    Standing Expiration Date:   07/02/2020    Scheduling Instructions:     Please schedule prior to 10/02/2019    Order Specific Question:   ** REASON FOR EXAM (FREE TEXT)    Answer:   Surveillance    Order Specific Question:   If  indicated for the ordered procedure, I authorize the administration of contrast media per Radiology protocol    Answer:   Yes    Order Specific Question:   What is the patient's sedation requirement?    Answer:   No Sedation    Order Specific Question:   Does the patient have a pacemaker or implanted devices?    Answer:   No    Order Specific Question:   Use SRS Protocol?    Answer:   No    Order Specific Question:   Radiology Contrast Protocol - do NOT remove file path    Answer:   \\[email or web address]    Order Specific Question:   Preferred imaging location?    Answer:   [facility] (table limit 350lbs)  . CBC with Differential ([place])  Standing Status:   Standing    Number of Occurrences:   20    Standing Expiration Date:   07/02/2020  . CMP ([place] only)    Standing Status:   Standing    Number of Occurrences:   20    Standing Expiration Date:   07/02/2020  . Lactate dehydrogenase    Standing Status:   Standing    Number of Occurrences:   20    Standing Expiration Date:   07/02/2020    The total time spent in the encounter was 45 minutes, including face-to-face time with the patient, review of various tests results, order additional studies/medications, documentation, and coordination of care plan.   All questions were answered. The patient knows to call the clinic with any problems, questions or concerns. No barriers to learning was detected.  Return in 6 weeks for labs only, and 3 months for labs, imaging results and clinic appt.   Marcia Men, MD 3/26/202111:48 AM  CHIEF COMPLAINT: "I am feeling much better"  INTERVAL HISTORY: [name] returns clinic for follow-up of a limited stage small cell lung cancer s/p definitive chemoradiation.  The patient reports that she recently met with radiation oncology at [facility], and is scheduled to start prophylactic whole brain radiation on 07/06/2019.  The  plan is for 10 fractions over 2 weeks.  She reports that she has been eating well, and her shortness of breath is overall improving.  She still takes MS-Contin twice a day for right-sided chest pain, but her pain is overall improving.  She denies any other complaint today.  REVIEW OF SYSTEMS:   Constitutional: ( - ) fevers, ( - )  chills , ( - ) night sweats Eyes: ( - ) blurriness of vision, ( - ) double vision, ( - ) watery eyes Ears, nose, mouth, throat, and face: ( - ) mucositis, ( - ) sore throat Respiratory: ( - ) cough, ( - ) dyspnea, ( - ) wheezes Cardiovascular: ( - ) palpitation, ( - ) chest discomfort, ( - ) lower extremity swelling Gastrointestinal:  ( - ) nausea, ( - ) heartburn, ( - ) change in bowel habits Skin: ( - ) abnormal skin rashes Lymphatics: ( - ) new lymphadenopathy, ( - ) easy bruising Neurological: ( - ) numbness, ( - ) tingling, ( - ) new weaknesses Behavioral/Psych: ( - ) mood change, ( - ) new changes  All other systems were reviewed with the patient and are negative.  SUMMARY OF ONCOLOGIC HISTORY: Oncology History  Small cell lung cancer ([place])  01/28/2019 Initial Diagnosis   Lung cancer ([place])   01/28/2019 Imaging   CTA chest: IMPRESSION: 1. No demonstrable pulmonary embolus. No thoracic aortic aneurysm or dissection.   2. Mass arising in the right upper lobe extending into the right hilum measuring 3.1 x 2.5 cm. Questionable second mass in the right upper lobe more peripherally and anteriorly measuring 2.4 x 1.7 cm versus consolidation in this area. There is adenopathy in the right hilum and right paratracheal regions. Neoplastic involvement is felt to be present. It may be prudent to correlate with nuclear medicine PET study to further characterize.   3. Underlying emphysematous change with areas of fibrosis throughout the periphery of each lung. Fibrosis is somewhat evenly distributed between upper and lower lobe regions. There is atelectatic  change in the lung bases, more on the right than on the left.   Emphysema (ICD10-J43.9).   02/06/2019 Imaging  PET:   IMPRESSION: Right upper lobe perihilar lung mass is intensely hypermetabolic compatible with primary bronchogenic carcinoma. Suspect mediastinal invasion with hypermetabolic right paratracheal adenopathy. Assuming a non-small cell neoplasm this is favored to represent a T4N1M0 lesion or stage IIIa disease.   Indeterminate nodular area of subpleural consolidation in with thin the lateral right apex has an SUV max of 4.96. This may represent an area of postobstructive pneumonitis versus satellite lesion in same lobe.   Aortic Atherosclerosis (ICD10-I70.0) and Emphysema (ICD10-J43.9).   02/07/2019 Imaging   MRI brain: IMPRESSION: 1. Normal MRI appearance the brain. No evidence for metastatic disease to the brain. 2. Left mastoid effusion without obstructing nasopharyngeal lesion.   02/12/2019 Pathology Results   Specimen Submitted:  A. LUNG, RIGHT UPPER LOBE, LAVAGE:    FINAL MICROSCOPIC DIAGNOSIS:  - Atypical cells present  - See comment   SPECIMEN ADEQUACY:  Satisfactory for evaluation   DIAGNOSTIC COMMENTS:  Mixed acute and chronic inflammation is present.  There are rare atypical cells present on the heme slides only.  These  are not seen on the cytology smears.    02/12/2019 Pathology Results   FINAL MICROSCOPIC DIAGNOSIS:   A.   MASS, RIGHT HILAR, FINE NEEDLE ASPIRATION:  -  Malignant cells consistent with small cell carcinoma  -  See comment   B. LYMPH NODE, 7 NODE, FINE NEEDLE ASPIRATION:  -  No malignant cells identified   COMMENT:   A.  Immunohistochemistry performed on the cell block shows the  neoplastic cells are positive for TTF-1, CD56, and synaptophysin but  negative for chromogranin, cytokeratin 5 6 and p40.  Overall, the  combined morphology and immunophenotype are consistent with small cell  carcinoma.    02/18/2019 Cancer  Staging   Staging form: Lung, AJCC 8th Edition - Clinical: Stage IIIA (cT4, cN1, cM0) - Signed by Marcia Men, MD on 02/18/2019   02/24/2019 -  Chemotherapy   The patient had palonosetron (ALOXI) injection 0.25 mg, 0.25 mg, Intravenous,  Once, 4 of 4 cycles Administration: 0.25 mg (02/24/2019), 0.25 mg (03/24/2019), 0.25 mg (04/21/2019), 0.25 mg (05/26/2019) pegfilgrastim-cbqv (UDENYCA) injection 6 mg, 6 mg, Subcutaneous, Once, 3 of 3 cycles Administration: 6 mg (03/27/2019), 6 mg (04/24/2019), 6 mg (05/29/2019) CISplatin (PLATINOL) 125 mg in sodium chloride 0.9 % 500 mL chemo infusion, 75 mg/m2 = 125 mg (100 % of original dose 75 mg/m2), Intravenous,  Once, 4 of 4 cycles Dose modification: 75 mg/m2 (original dose 75 mg/m2, Cycle 1, Reason: Provider Judgment), 60 mg/m2 (original dose 75 mg/m2, Cycle 4, Reason: Dose not tolerated), 55 mg/m2 (original dose 75 mg/m2, Cycle 4, Reason: Treatment Parameters Not Met) Administration: 125 mg (02/24/2019), 125 mg (03/24/2019), 125 mg (04/21/2019), 91 mg (05/26/2019) etoposide (VEPESID) 170 mg in sodium chloride 0.9 % 500 mL chemo infusion, 100 mg/m2 = 170 mg, Intravenous,  Once, 4 of 4 cycles Dose modification: 80 mg/m2 (original dose 100 mg/m2, Cycle 2, Reason: Dose not tolerated), 100 mg/m2 (original dose 100 mg/m2, Cycle 2, Reason: Provider Judgment), 80 mg/m2 (original dose 100 mg/m2, Cycle 3, Reason: Treatment Parameters Not Met) Administration: 170 mg (02/24/2019), 170 mg (02/25/2019), 170 mg (02/26/2019), 170 mg (03/24/2019), 170 mg (03/25/2019), 170 mg (03/26/2019), 130 mg (04/21/2019), 130 mg (04/22/2019), 130 mg (04/23/2019), 130 mg (05/26/2019), 130 mg (05/27/2019), 130 mg (05/28/2019) fosaprepitant (EMEND) 150 mg, dexamethasone (DECADRON) 12 mg in sodium chloride 0.9 % 145 mL IVPB, , Intravenous,  Once, 4 of 4 cycles Administration:  (02/24/2019),  (03/24/2019),  (04/21/2019),  (  05/26/2019)  for chemotherapy treatment.    06/20/2019 Imaging   CT  CAP: IMPRESSION: Prior medial right upper lobe/suprahilar mass has resolved. Residual 11 mm short axis right hilar node, improved.   Additional subpleural nodule in the lateral right lung apex measures 1.3 x 2.0 cm, improved from Oct 2020.   No findings suspicious for metastatic disease in the abdomen/pelvis.   Additional ancillary findings as above.   06/20/2019 Imaging   MRI brain: IMPRESSION: No evidence of metastatic disease.     I have reviewed the past medical history, past surgical history, social history and family history with the patient and they are unchanged from previous note.  ALLERGIES:  has No Known Allergies.  MEDICATIONS:  Current Outpatient Medications  Medication Sig Dispense Refill  . albuterol (PROVENTIL HFA;VENTOLIN HFA) 108 (90 Base) MCG/ACT inhaler Inhale 1-2 puffs into the lungs every 4 (four) hours as needed for wheezing or shortness of breath.     Marland Kitchen apixaban (ELIQUIS) 5 MG TABS tablet Take 1 tablet (5 mg total) by mouth 2 (two) times daily. 60 tablet 11  . aspirin EC 325 MG tablet Take by mouth.    . benzonatate (TESSALON) 100 MG capsule Take 1 capsule (100 mg total) by mouth 3 (three) times daily as needed for cough. 40 capsule 2  . calcium-vitamin D (OSCAL WITH D) 500-200 MG-UNIT TABS tablet Take 1 tablet by mouth every morning.    Marland Kitchen dexamethasone (DECADRON) 4 MG tablet Take 2 tablets two times a day for 1 day on day 4 after cisplatin chemotherapy. Take with food. 30 tablet 1  . diphenoxylate-atropine (LOMOTIL) 2.5-0.025 MG tablet Take 2 tablets by mouth 4 (four) times daily as needed for diarrhea or loose stools. 30 tablet 0  . guaiFENesin-dextromethorphan (ROBITUSSIN DM) 100-10 MG/5ML syrup Take 10 mLs by mouth every 6 (six) hours as needed for cough. 120 mL 3  . HYDROcodone-acetaminophen (NORCO) 7.5-325 MG tablet Take 1 tablet by mouth every 8 (eight) hours as needed for moderate pain. 60 tablet 0  . ibuprofen (ADVIL) 800 MG tablet Take 1 tablet (800  mg total) by mouth 3 (three) times daily. (Patient taking differently: Take 800 mg by mouth every 8 (eight) hours as needed (pain). ) 21 tablet 0  . lidocaine-prilocaine (EMLA) cream Apply to affected area once 30 g 3  . LORazepam (ATIVAN) 0.5 MG tablet Take 1 tablet (0.5 mg total) by mouth every 6 (six) hours as needed (Nausea or vomiting). 30 tablet 0  . magnesium oxide (MAG-OX) 400 MG tablet Take 1 tablet by mouth daily.    Marland Kitchen morphine (MS CONTIN) 15 MG 12 hr tablet Take 1 tablet (15 mg total) by mouth every 12 (twelve) hours. 60 tablet 0  . nystatin (MYCOSTATIN) 100000 UNIT/ML suspension Take 5 mLs (500,000 Units total) by mouth 4 (four) times daily. Swish and swallow 60 mL 2  . ondansetron (ZOFRAN) 8 MG tablet Take 1 tablet (8 mg total) by mouth 2 (two) times daily as needed. Start on the third day after cisplatin chemotherapy. 30 tablet 2  . pantoprazole (PROTONIX) 40 MG tablet Take 1 tablet (40 mg total) by mouth daily. 30 tablet 3  . prochlorperazine (COMPAZINE) 10 MG tablet Take 1 tablet (10 mg total) by mouth every 6 (six) hours as needed (Nausea or vomiting). 30 tablet 1  . promethazine-dextromethorphan (PROMETHAZINE-DM) 6.25-15 MG/5ML syrup Take 5 mLs by mouth 4 (four) times daily as needed for cough. 240 mL 1  . sucralfate (CARAFATE) 1 GM/10ML  suspension Take 10 mLs (1 g total) by mouth 4 (four) times daily -  with meals and at bedtime. 420 mL 2   No current facility-administered medications for this visit.   Facility-Administered Medications Ordered in Other Visits  Medication Dose Route Frequency Provider Last Rate Last Admin  . sodium chloride flush (NS) 0.9 % injection 10 mL  10 mL Intravenous PRN [place], Holli Humbles, NP   10 mL at 05/19/19 4650    PHYSICAL EXAMINATION: ECOG PERFORMANCE STATUS: 1 - Symptomatic but completely ambulatory  Today's Vitals   07/03/19 1101  BP: 104/81  Pulse: 100  Resp: 19  Temp: (!) 97.5 F (36.4 C)  TempSrc: Temporal  SpO2: 100%  Weight:  140 lb (63.5 kg)  Height: 5' (1.524 m)  PainSc: 0-No pain   Body mass index is 27.34 kg/m.  Filed Weights   07/03/19 1101  Weight: 140 lb (63.5 kg)    GENERAL: alert, no distress and comfortable, walks with crutches  SKIN: skin color, texture, turgor are normal, no rashes or significant lesions EYES: conjunctiva are pink and non-injected, sclera clear OROPHARYNX: no exudate, no erythema; lips, buccal mucosa, and tongue normal  NECK: supple, non-tender LUNGS: clear to auscultation with normal breathing effort HEART: regular rate & rhythm and no murmurs and no lower extremity edema ABDOMEN: soft, non-tender, non-distended, normal bowel sounds Musculoskeletal: no cyanosis of digits and no clubbing  PSYCH: alert & oriented x 3, fluent speech  LABORATORY DATA:  I have reviewed the data as listed    Component Value Date/Time   NA 138 07/03/2019 1040   K 4.1 07/03/2019 1040   CL 105 07/03/2019 1040   CO2 23 07/03/2019 1040   GLUCOSE 135 (H) 07/03/2019 1040   BUN 21 (H) 07/03/2019 1040   CREATININE 1.17 (H) 07/03/2019 1040   CALCIUM 9.4 07/03/2019 1040   PROT 6.8 07/03/2019 1040   ALBUMIN 3.9 07/03/2019 1040   AST 17 07/03/2019 1040   ALT 11 07/03/2019 1040   ALKPHOS 83 07/03/2019 1040   BILITOT 0.3 07/03/2019 1040   GFRNONAA 55 (L) 07/03/2019 1040   GFRAA >60 07/03/2019 1040    No results found for: SPEP, UPEP  Lab Results  Component Value Date   WBC 4.4 07/03/2019   NEUTROABS 3.3 07/03/2019   HGB 10.2 (L) 07/03/2019   HCT 30.6 (L) 07/03/2019   MCV 95.3 07/03/2019   PLT 78 (L) 07/03/2019      Chemistry      Component Value Date/Time   NA 138 07/03/2019 1040   K 4.1 07/03/2019 1040   CL 105 07/03/2019 1040   CO2 23 07/03/2019 1040   BUN 21 (H) 07/03/2019 1040   CREATININE 1.17 (H) 07/03/2019 1040      Component Value Date/Time   CALCIUM 9.4 07/03/2019 1040   ALKPHOS 83 07/03/2019 1040   AST 17 07/03/2019 1040   ALT 11 07/03/2019 1040   BILITOT 0.3  07/03/2019 1040       RADIOGRAPHIC STUDIES: I have personally reviewed the radiological images as listed below and agreed with the findings in the report. CT CHEST W CONTRAST  Result Date: 06/21/2019 CLINICAL DATA:  Small cell lung cancer, status post chemo radiation, assess treatment response EXAM: CT CHEST, ABDOMEN, AND PELVIS WITH CONTRAST TECHNIQUE: Multidetector CT imaging of the chest, abdomen and pelvis was performed following the standard protocol during bolus administration of intravenous contrast. CONTRAST:  157m OMNIPAQUE IOHEXOL 300 MG/ML  SOLN COMPARISON:  HP CT chest dated  06/08/2019. PET-CT dated 02/06/2019. CTA chest dated 01/27/2019. FINDINGS: CT CHEST FINDINGS Cardiovascular: The heart is normal in size. No pericardial effusion. No evidence of thoracic aortic aneurysm. Mild atherosclerotic calcifications of the aortic arch. Left chest port terminates at the cavoatrial junction. Mediastinum/Nodes: No suspicious mediastinal or axillary lymphadenopathy. Prior mediastinal lymphadenopathy on prior PET has resolved. 11 mm short axis node in the right hilar region (series 2/image 21), previously 13 mm on recent CT. Visualized thyroid is unremarkable. Lungs/Pleura: Prior medial right upper lobe/suprahilar mass has essentially resolved. Additional subpleural nodule in the lateral right lung apex measuring 1.3 x 2.0 cm (series 4/image 29), grossly unchanged from recent study but previously 1.5 x 3.0 cm in Oct 2020. Subpleural reticulation/fibrosis with honeycombing in the lungs bilaterally. Mild lower lobe predominance. 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 No focal consolidation. No pleural effusion or pneumothorax. Musculoskeletal: Visualized osseous structures are within normal limits. CT ABDOMEN PELVIS FINDINGS Hepatobiliary: Liver is within normal limits. No suspicious/enhancing hepatic lesions. Status post cholecystectomy. No  intrahepatic or extrahepatic ductal dilatation. Pancreas: Within normal limits. Spleen: Within normal limits. Adrenals/Urinary Tract: Adrenal glands are within normal limits. Kidneys are within normal limits. No hydronephrosis. Bladder is within normal limits. Stomach/Bowel: Stomach is within normal limits. No evidence of bowel obstruction. Normal appendix (series 2/image 80). Mild left colonic stool burden. Vascular/Lymphatic: No evidence of abdominal aortic aneurysm. Atherosclerotic calcifications of the abdominal aorta and branch vessels. IVC filter. No suspicious abdominopelvic lymphadenopathy. Reproductive: Uterus is within normal limits. Bilateral ovaries are unremarkable. Other: No abdominopelvic ascites. Musculoskeletal: Lumbar spine is within normal limits. Status post ORIF of the left pelvis/acetabulum with deformity of the left hip/femoral head with associated foreshortening and degenerative changes. Old bilateral superior and inferior pubic ring fractures. IMPRESSION: Prior medial right upper lobe/suprahilar mass has resolved. Residual 11 mm short axis right hilar node, improved. Additional subpleural nodule in the lateral right lung apex measures 1.3 x 2.0 cm, improved from Oct 2020. No findings suspicious for metastatic disease in the abdomen/pelvis. Additional ancillary findings as above. Electronically Signed   By: Julian Hy M.D.   On: 06/21/2019 09:06   MR BRAIN W WO CONTRAST  Result Date: 06/21/2019 CLINICAL DATA:  Small cell lung cancer.  Staging EXAM: MRI HEAD WITHOUT AND WITH CONTRAST TECHNIQUE: Multiplanar, multiecho pulse sequences of the brain and surrounding structures were obtained without and with intravenous contrast. CONTRAST:  6.101m GADAVIST GADOBUTROL 1 MMOL/ML IV SOLN COMPARISON:  02/07/2019 FINDINGS: Brain: No swelling or enhancement to suggest metastatic disease. No infarct, hydrocephalus, or collection. Vascular: Normal flow voids and vascular enhancements Skull and  upper cervical spine: Normal marrow signal Sinuses/Orbits: Negative Other: There is motion artifact but postcontrast images are diagnostic. IMPRESSION: No evidence of metastatic disease. Electronically Signed   By: JMonte FantasiaM.D.   On: 06/21/2019 12:20   CT ABDOMEN PELVIS W CONTRAST  Result Date: 06/21/2019 CLINICAL DATA:  Small cell lung cancer, status post chemo radiation, assess treatment response EXAM: CT CHEST, ABDOMEN, AND PELVIS WITH CONTRAST TECHNIQUE: Multidetector CT imaging of the chest, abdomen and pelvis was performed following the standard protocol during bolus administration of intravenous contrast. CONTRAST:  10108mOMNIPAQUE IOHEXOL 300 MG/ML  SOLN COMPARISON:  HP CT chest dated 06/08/2019. PET-CT dated 02/06/2019. CTA chest dated 01/27/2019. FINDINGS: CT CHEST FINDINGS Cardiovascular: The heart is normal in size. No pericardial effusion. No evidence of thoracic aortic aneurysm. Mild atherosclerotic calcifications of the aortic arch. Left chest  port terminates at the cavoatrial junction. Mediastinum/Nodes: No suspicious mediastinal or axillary lymphadenopathy. Prior mediastinal lymphadenopathy on prior PET has resolved. 11 mm short axis node in the right hilar region (series 2/image 21), previously 13 mm on recent CT. Visualized thyroid is unremarkable. Lungs/Pleura: Prior medial right upper lobe/suprahilar mass has essentially resolved. Additional subpleural nodule in the lateral right lung apex measuring 1.3 x 2.0 cm (series 4/image 29), grossly unchanged from recent study but previously 1.5 x 3.0 cm in Oct 2020. Subpleural reticulation/fibrosis with honeycombing in the lungs bilaterally. Mild lower lobe predominance. 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 No focal consolidation. No pleural effusion or pneumothorax. Musculoskeletal: Visualized osseous structures are within normal limits. CT ABDOMEN PELVIS FINDINGS  Hepatobiliary: Liver is within normal limits. No suspicious/enhancing hepatic lesions. Status post cholecystectomy. No intrahepatic or extrahepatic ductal dilatation. Pancreas: Within normal limits. Spleen: Within normal limits. Adrenals/Urinary Tract: Adrenal glands are within normal limits. Kidneys are within normal limits. No hydronephrosis. Bladder is within normal limits. Stomach/Bowel: Stomach is within normal limits. No evidence of bowel obstruction. Normal appendix (series 2/image 80). Mild left colonic stool burden. Vascular/Lymphatic: No evidence of abdominal aortic aneurysm. Atherosclerotic calcifications of the abdominal aorta and branch vessels. IVC filter. No suspicious abdominopelvic lymphadenopathy. Reproductive: Uterus is within normal limits. Bilateral ovaries are unremarkable. Other: No abdominopelvic ascites. Musculoskeletal: Lumbar spine is within normal limits. Status post ORIF of the left pelvis/acetabulum with deformity of the left hip/femoral head with associated foreshortening and degenerative changes. Old bilateral superior and inferior pubic ring fractures. IMPRESSION: Prior medial right upper lobe/suprahilar mass has resolved. Residual 11 mm short axis right hilar node, improved. Additional subpleural nodule in the lateral right lung apex measures 1.3 x 2.0 cm, improved from Oct 2020. No findings suspicious for metastatic disease in the abdomen/pelvis. Additional ancillary findings as above. Electronically Signed   By: Julian Hy M.D.   On: 06/21/2019 09:06

## 2019-07-20 MED ORDER — MUPIROCIN 2 % EX OINT
TOPICAL_OINTMENT | CUTANEOUS | Status: DC
Start: 2019-07-21 — End: 2019-07-20

## 2019-07-20 MED ORDER — ALBUTEROL SULFATE (2.5 MG/3ML) 0.083% IN NEBU
2.50 | INHALATION_SOLUTION | RESPIRATORY_TRACT | Status: DC
Start: ? — End: 2019-07-20

## 2019-07-20 MED ORDER — LORAZEPAM 0.5 MG PO TABS
0.50 | ORAL_TABLET | ORAL | Status: DC
Start: 2019-07-20 — End: 2019-07-20

## 2019-07-20 MED ORDER — NICOTINE 21 MG/24HR TD PT24
1.00 | MEDICATED_PATCH | TRANSDERMAL | Status: DC
Start: 2019-07-24 — End: 2019-07-20

## 2019-07-20 MED ORDER — QUINTABS PO TABS
1.00 | ORAL_TABLET | ORAL | Status: DC
Start: 2019-07-24 — End: 2019-07-20

## 2019-07-20 MED ORDER — CHLORHEXIDINE GLUCONATE 0.12 % MT SOLN
15.00 | OROMUCOSAL | Status: DC
Start: 2019-07-24 — End: 2019-07-20

## 2019-07-20 MED ORDER — THIAMINE HCL 100 MG PO TABS
100.00 | ORAL_TABLET | ORAL | Status: DC
Start: 2019-07-24 — End: 2019-07-20

## 2019-07-20 MED ORDER — PROPOFOL 100 MG/10ML IV EMUL
0.00 | INTRAVENOUS | Status: DC
Start: ? — End: 2019-07-20

## 2019-07-20 MED ORDER — CHLORHEXIDINE GLUCONATE 0.12 % MT SOLN
15.00 | OROMUCOSAL | Status: DC
Start: ? — End: 2019-07-20

## 2019-07-20 MED ORDER — NOREPINEPHRINE-SODIUM CHLORIDE 16-0.9 MG/250ML-% IV SOLN
0.00 | INTRAVENOUS | Status: DC
Start: ? — End: 2019-07-20

## 2019-07-20 MED ORDER — ARFORMOTEROL TARTRATE 15 MCG/2ML IN NEBU
15.00 | INHALATION_SOLUTION | RESPIRATORY_TRACT | Status: DC
Start: 2019-07-23 — End: 2019-07-20

## 2019-07-20 MED ORDER — GENERIC EXTERNAL MEDICATION
Status: DC
Start: ? — End: 2019-07-20

## 2019-07-20 MED ORDER — FOLIC ACID 1 MG PO TABS
1.00 | ORAL_TABLET | ORAL | Status: DC
Start: 2019-07-24 — End: 2019-07-20

## 2019-07-20 MED ORDER — FAMOTIDINE 20 MG PO TABS
20.00 | ORAL_TABLET | ORAL | Status: DC
Start: 2019-07-20 — End: 2019-07-20

## 2019-07-20 MED ORDER — IPRATROPIUM-ALBUTEROL 0.5-2.5 (3) MG/3ML IN SOLN
3.00 | RESPIRATORY_TRACT | Status: DC
Start: 2019-07-24 — End: 2019-07-20

## 2019-07-20 MED ORDER — GENERIC EXTERNAL MEDICATION
250.00 | Status: DC
Start: 2019-07-20 — End: 2019-07-20

## 2019-07-20 MED ORDER — BUDESONIDE 0.5 MG/2ML IN SUSP
0.50 | RESPIRATORY_TRACT | Status: DC
Start: 2019-07-24 — End: 2019-07-20

## 2019-07-20 MED ORDER — FENTANYL CITRATE (PF) 50 MCG/ML IJ SOLN
0.00 | INTRAMUSCULAR | Status: DC
Start: ? — End: 2019-07-20

## 2019-07-20 MED ORDER — GENERIC EXTERNAL MEDICATION
0.00 | Status: DC
Start: ? — End: 2019-07-20

## 2019-07-21 ENCOUNTER — Other Ambulatory Visit: Payer: Self-pay | Admitting: Hematology & Oncology

## 2019-07-21 MED ORDER — LORAZEPAM 2 MG/ML IJ SOLN
1.00 | INTRAMUSCULAR | Status: DC
Start: ? — End: 2019-07-21

## 2019-07-21 MED ORDER — POLYETHYLENE GLYCOL 3350 17 GM/SCOOP PO POWD
17.00 | ORAL | Status: DC
Start: 2019-07-24 — End: 2019-07-21

## 2019-07-21 MED ORDER — GENERIC EXTERNAL MEDICATION
60.00 | Status: DC
Start: 2019-07-24 — End: 2019-07-21

## 2019-07-21 MED ORDER — FUROSEMIDE 10 MG/ML IJ SOLN
40.00 | INTRAMUSCULAR | Status: DC
Start: 2019-07-21 — End: 2019-07-21

## 2019-07-21 MED ORDER — FAMOTIDINE 20 MG PO TABS
20.00 | ORAL_TABLET | ORAL | Status: DC
Start: 2019-07-24 — End: 2019-07-21

## 2019-07-21 MED ORDER — GENERIC EXTERNAL MEDICATION
Status: DC
Start: ? — End: 2019-07-21

## 2019-07-21 MED ORDER — SENNOSIDES 8.6 MG PO TABS
8.60 | ORAL_TABLET | ORAL | Status: DC
Start: 2019-07-24 — End: 2019-07-21

## 2019-07-21 MED ORDER — GENERIC EXTERNAL MEDICATION
50.00 | Status: DC
Start: ? — End: 2019-07-21

## 2019-07-21 MED ORDER — OXYCODONE HCL 5 MG PO TABS
10.00 | ORAL_TABLET | ORAL | Status: DC
Start: 2019-07-22 — End: 2019-07-21

## 2019-07-21 MED ORDER — LORAZEPAM 1 MG PO TABS
1.00 | ORAL_TABLET | ORAL | Status: DC
Start: 2019-07-23 — End: 2019-07-21

## 2019-07-22 MED ORDER — SODIUM CHLORIDE 0.9 % IV SOLN
250.00 | INTRAVENOUS | Status: DC
Start: ? — End: 2019-07-22

## 2019-07-22 MED ORDER — NOREPINEPHRINE-SODIUM CHLORIDE 16-0.9 MG/250ML-% IV SOLN
0.00 | INTRAVENOUS | Status: DC
Start: ? — End: 2019-07-22

## 2019-07-23 ENCOUNTER — Other Ambulatory Visit (HOSPITAL_COMMUNITY): Payer: Medicaid Other

## 2019-07-23 ENCOUNTER — Ambulatory Visit (HOSPITAL_COMMUNITY): Payer: Medicaid Other

## 2019-07-23 MED ORDER — OXYCODONE HCL 15 MG PO TABS
15.00 | ORAL_TABLET | ORAL | Status: DC
Start: 2019-07-24 — End: 2019-07-23

## 2019-07-24 MED ORDER — GENERIC EXTERNAL MEDICATION
50.00 | Status: DC
Start: ? — End: 2019-07-24

## 2019-07-24 MED ORDER — GENERIC EXTERNAL MEDICATION
0.00 | Status: DC
Start: ? — End: 2019-07-24

## 2019-07-27 ENCOUNTER — Encounter: Payer: Self-pay | Admitting: *Deleted

## 2019-07-27 ENCOUNTER — Telehealth: Payer: Self-pay | Admitting: Hematology

## 2019-07-27 NOTE — Telephone Encounter (Signed)
Patient deceased 08-21-2019 @ 12:02pm per fax from [facility]

## 2019-07-28 ENCOUNTER — Telehealth: Payer: Self-pay | Admitting: *Deleted

## 2019-07-28 NOTE — Telephone Encounter (Signed)
Received faxed office note stating patient had passed away on 07-28-2019 at 12:02 pm. MD made aware. Amelia Jo, RN

## 2019-08-08 DEATH — deceased

## 2019-08-14 ENCOUNTER — Other Ambulatory Visit: Payer: Medicaid Other

## 2019-10-02 ENCOUNTER — Other Ambulatory Visit: Payer: Medicaid Other

## 2019-10-02 ENCOUNTER — Ambulatory Visit: Payer: Medicaid Other | Admitting: Hematology & Oncology

## 2020-08-09 IMAGING — MR MR HEAD WO/W CM
9 of 11 series · 36 of 48 positions shown · IV contrast (gadavist)
Comparison: 02/07/2019

CLINICAL DATA: Small cell lung cancer.  Staging

EXAM:
MRI HEAD WITHOUT AND WITH CONTRAST
TECHNIQUE: Multiplanar, multiecho pulse sequences of the brain and surrounding
structures were obtained without and with intravenous contrast.
CONTRAST:  6.4mL GADAVIST GADOBUTROL 1 MMOL/ML IV SOLN

[Series 2: T1 · sagittal · 5.0mm · 0.45mm/px · 3 of 22 slices shown]
[im 1/22]
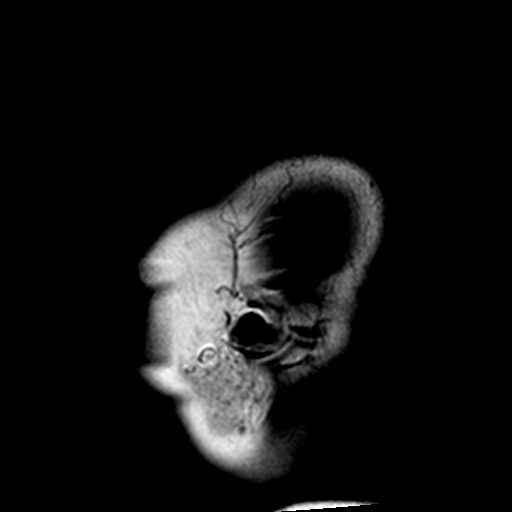
[im 11/22]
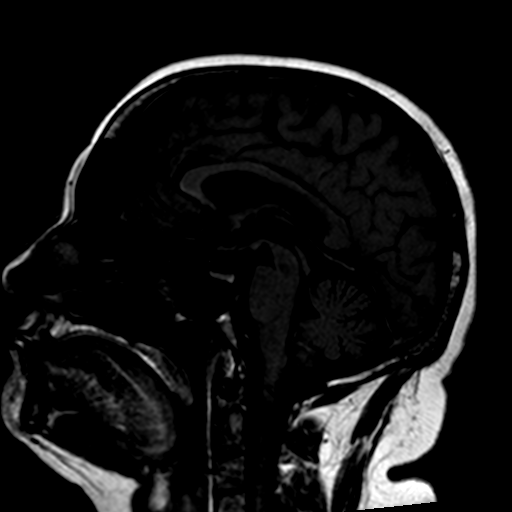
[im 22/22]
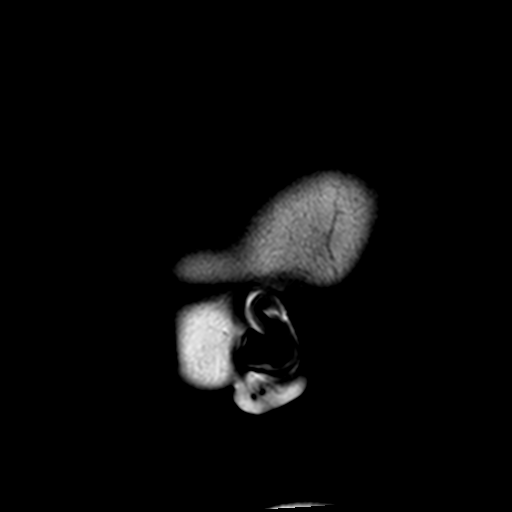

[Series 3: DWI · axial · 3.0mm · 2.19mm/px · z∈[-90,+69]mm · 11 of 97 slices shown (1 of 2)]
[im 1/97]
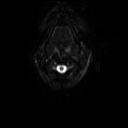
[im 10/97]
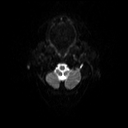
[im 20/97]
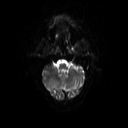
[im 29/97]
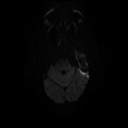
[im 39/97]
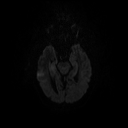
[im 49/97]
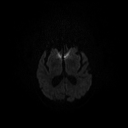
[im 58/97]
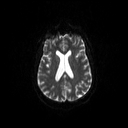
[im 68/97]
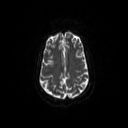
[im 77/97]
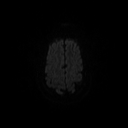
[im 87/97]
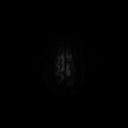
[im 97/97]
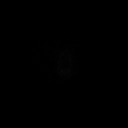

[Series 4: DWI · axial · 3.0mm · 2.19mm/px · z∈[-90,+69]mm · 5 of 49 slices shown (2 of 2)]
[im 1/49]
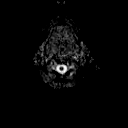
[im 13/49]
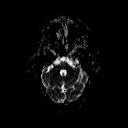
[im 25/49]
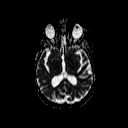
[im 37/49]
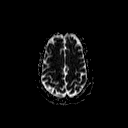
[im 49/49]
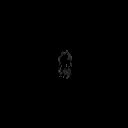

[Series 5: T2 · axial · 5.0mm · 0.45mm/px · z∈[-93,+81]mm · 3 of 26 slices shown (1 of 2)]
[im 1/26]
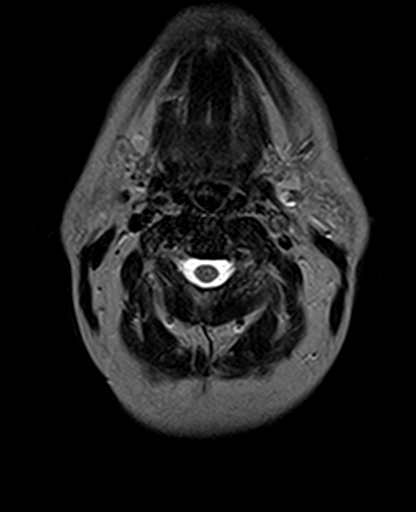
[im 13/26]
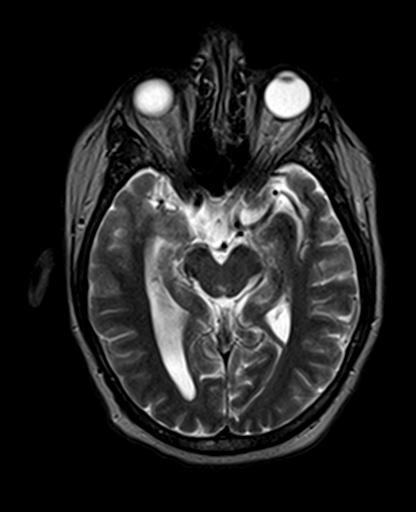
[im 26/26]
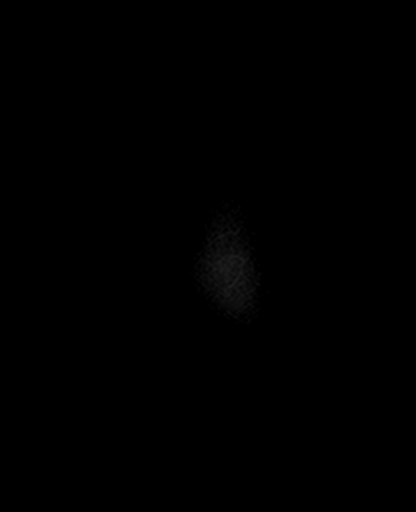

[Series 6: T2 · axial · 5.0mm · 0.45mm/px · z∈[-93,+81]mm · 3 of 26 slices shown (2 of 2)]
[im 1/26]
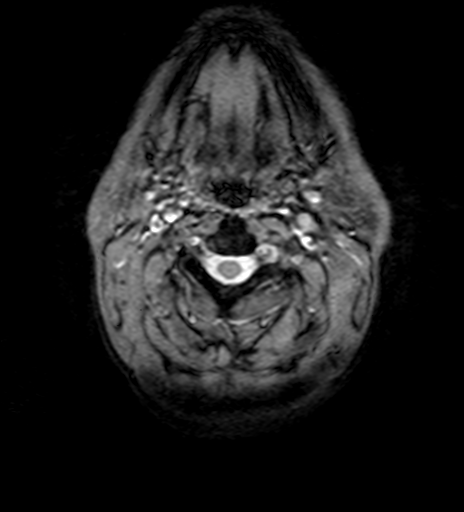
[im 13/26]
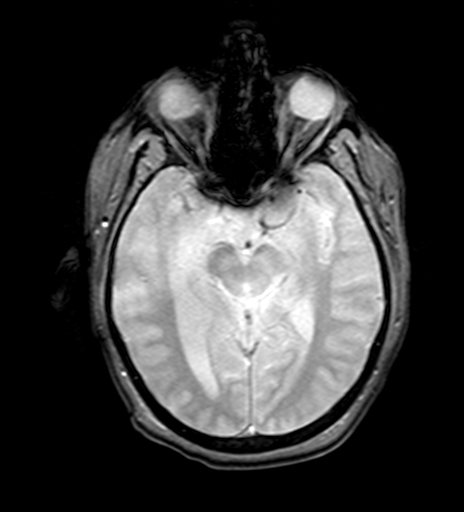
[im 26/26]
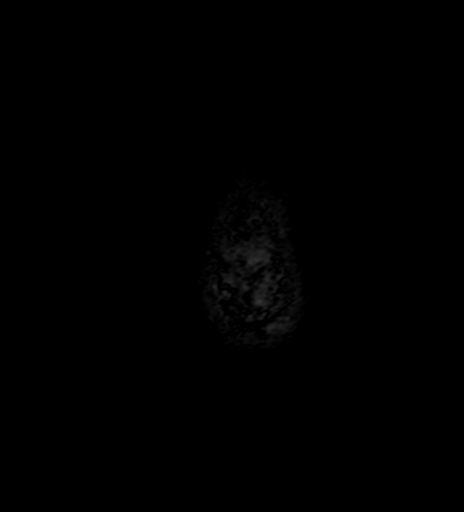

[Series 7: FLAIR · axial · 5.0mm · 0.45mm/px · z∈[-93,+81]mm · 3 of 26 slices shown]
[im 1/26]
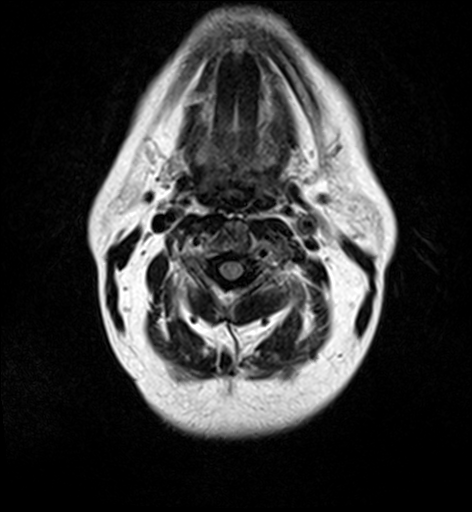
[im 13/26]
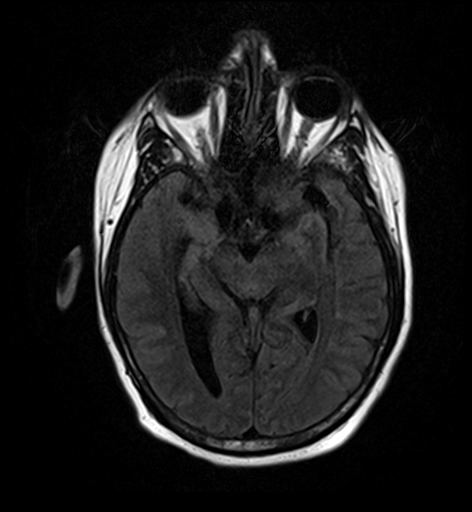
[im 26/26]
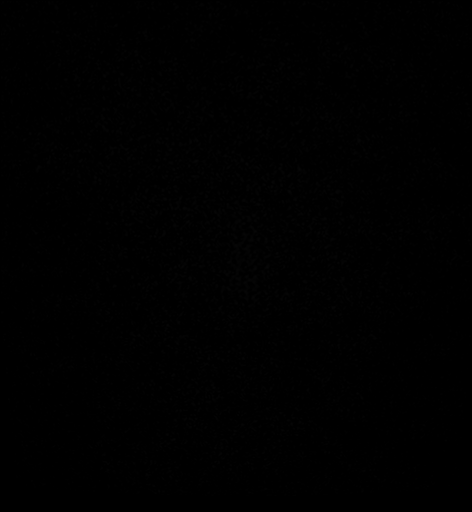

[Series 9: T2 post-contrast · coronal · 5.0mm · 0.45mm/px · 3 of 28 slices shown]
[im 1/28]
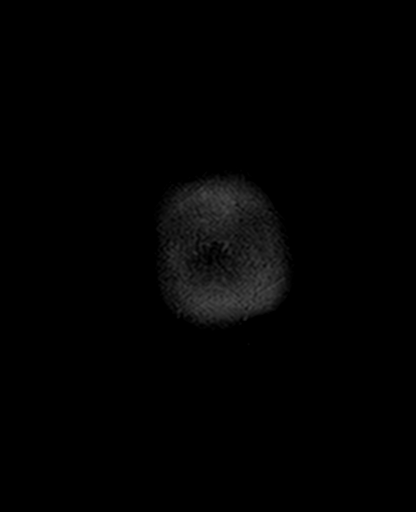
[im 14/28]
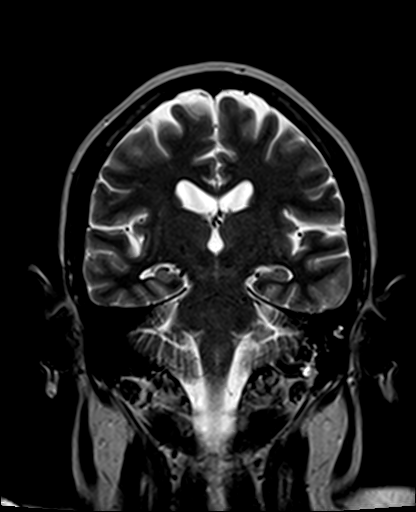
[im 28/28]
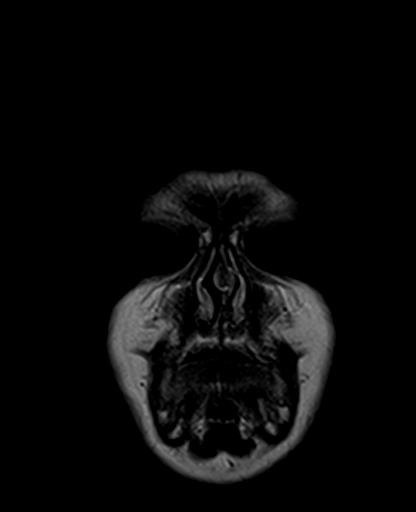

[Series 11: T1 post-contrast · coronal · 5.0mm · 0.45mm/px · 3 of 28 slices shown (1 of 2)]
[im 1/28]
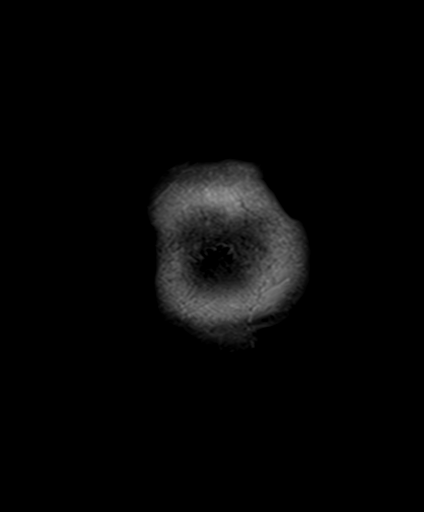
[im 14/28]
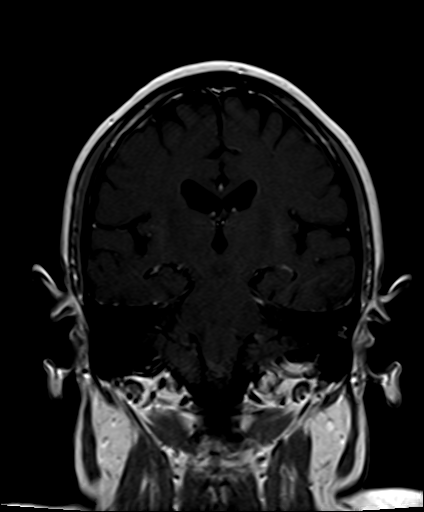
[im 28/28]
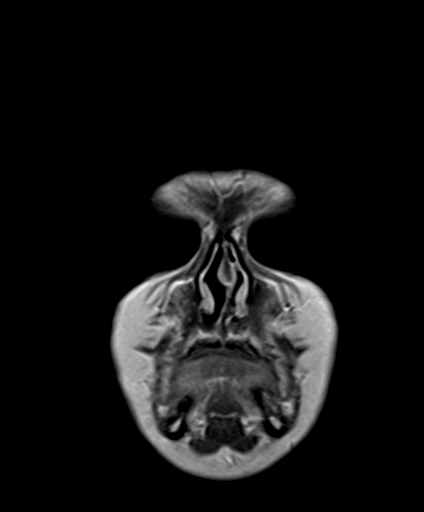

[Series 12: T1 post-contrast · sagittal · 5.0mm · 0.45mm/px · 2 of 23 slices shown (2 of 2)]
[im 1/23]
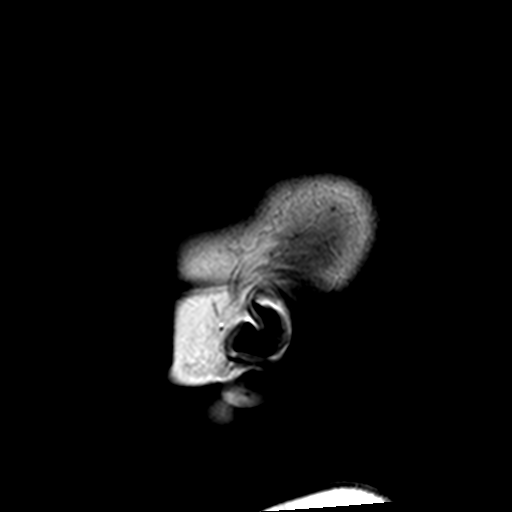
[im 23/23]
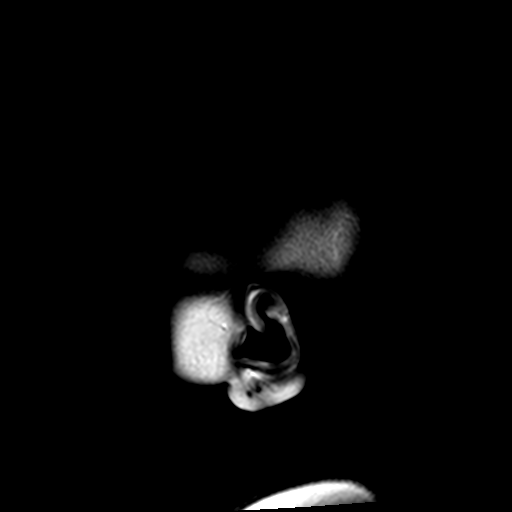

[36 of 48 positions shown; findings below may reference images not displayed]

FINDINGS: Brain: No swelling or enhancement to suggest metastatic disease. No
infarct, hydrocephalus, or collection.

Vascular: Normal flow voids and vascular enhancements

Skull and upper cervical spine: Normal marrow signal

Sinuses/Orbits: Negative

Other: There is motion artifact but postcontrast images are
diagnostic.
IMPRESSION: No evidence of metastatic disease.

## 2020-08-09 IMAGING — CT CT ABD-PELV W/ CM
2 of 5 series · 12 of 36 positions shown, 15 images · IV contrast (Omnipaque)
Comparison: [REDACTED] CT chest dated 06/08/2019. PET-CT dated 02/06/2019.
CTA chest dated 01/27/2019.

CLINICAL DATA: Small cell lung cancer, status post chemo radiation,
assess treatment response

EXAM:
CT CHEST, ABDOMEN, AND PELVIS WITH CONTRAST
TECHNIQUE: Multidetector CT imaging of the chest, abdomen and pelvis was
performed following the standard protocol during bolus
administration of intravenous contrast.
CONTRAST:  100mL OMNIPAQUE IOHEXOL 300 MG/ML  SOLN

[Series 2: cap with 2 · axial · 0.77mm/px · z∈[-568,-88]mm · 9 of 122 slices shown, 12 images]
[im 13/122  mediastinal]
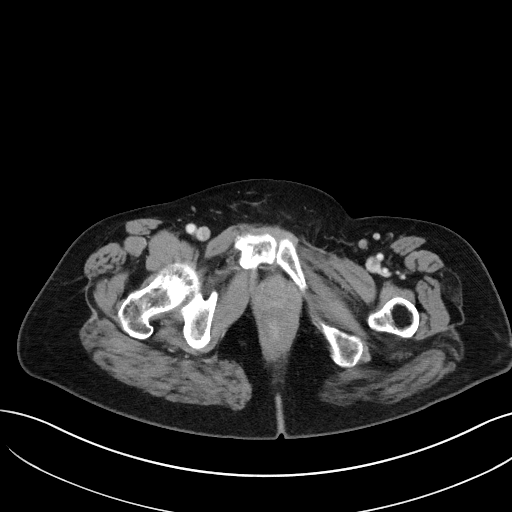
[im 13/122  lung]
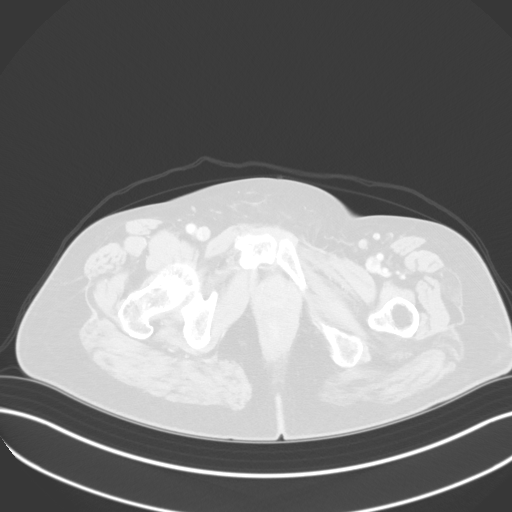
[im 25/122  lung]
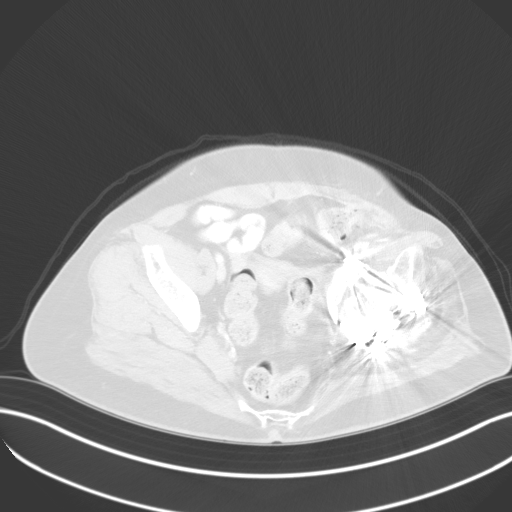
[im 37/122  lung]
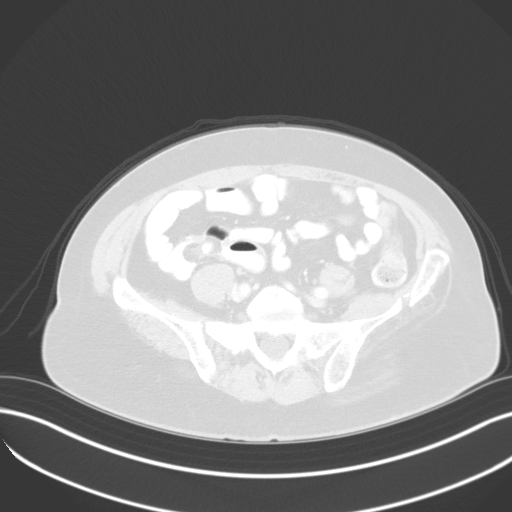
[im 49/122  lung]
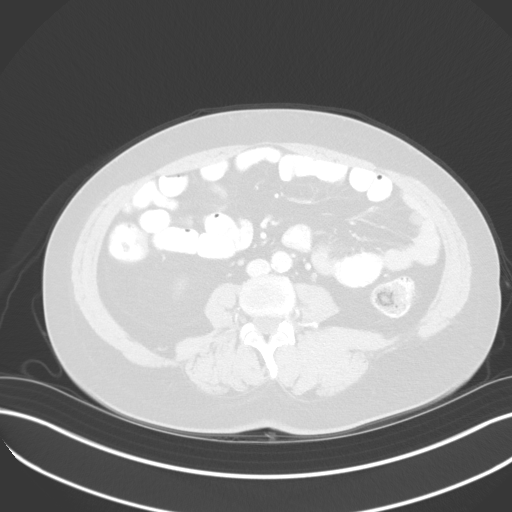
[im 61/122  mediastinal]
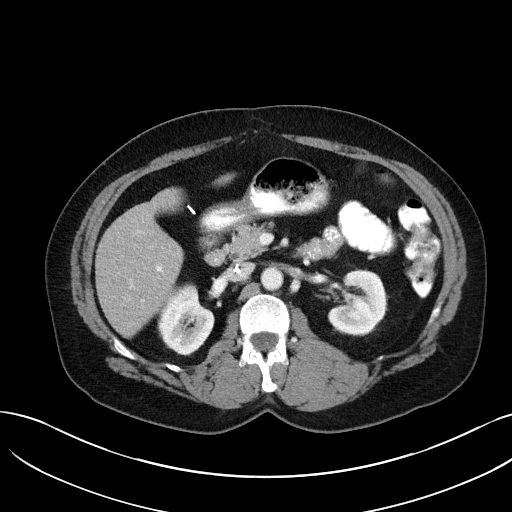
[im 61/122  lung]
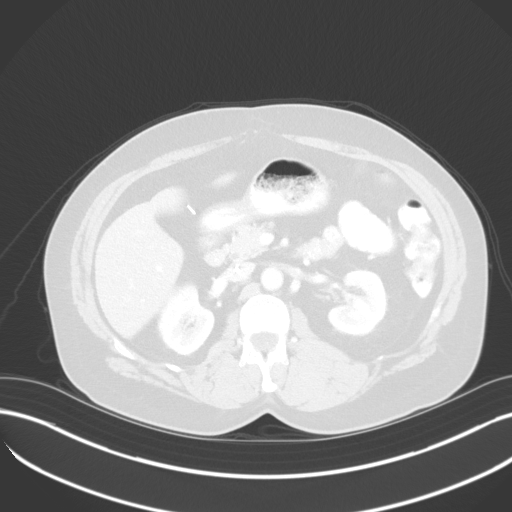
[im 73/122  lung]
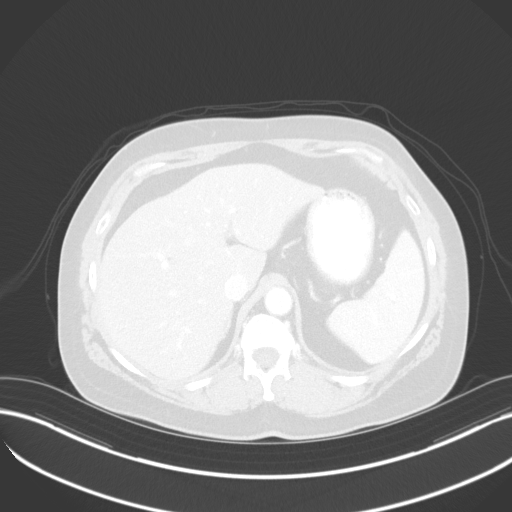
[im 85/122  lung]
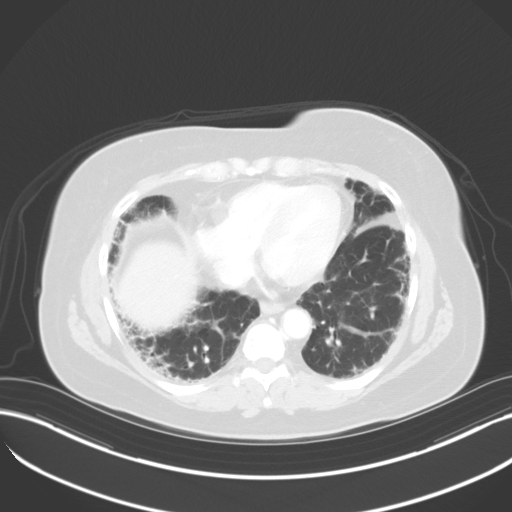
[im 97/122  lung]
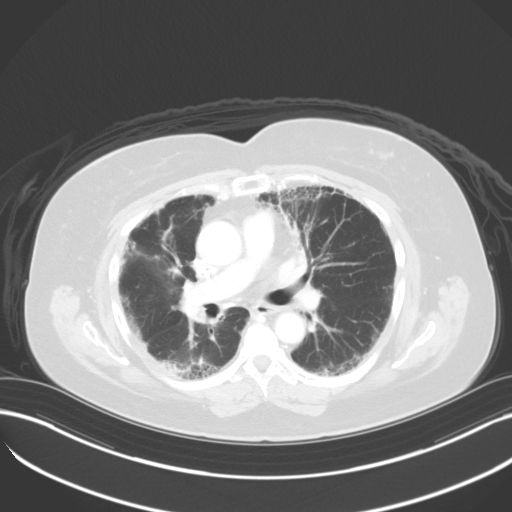
[im 109/122  mediastinal]
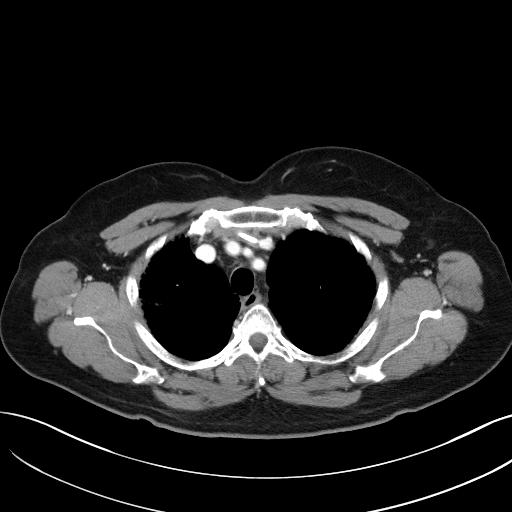
[im 109/122  lung]
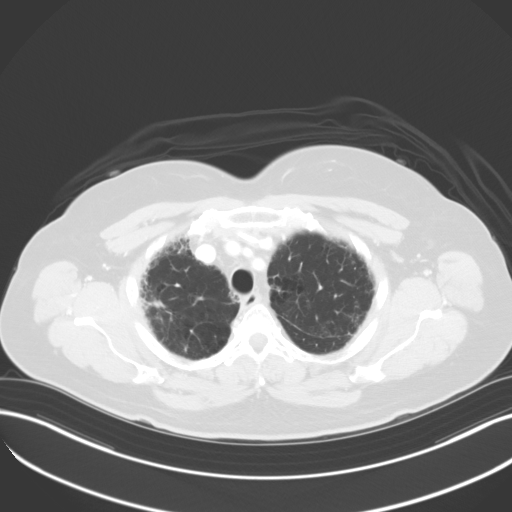

[Series 5: coronals · coronal · 0.84mm/px · 3 of 148 slices shown]
[im 30/148  lung]
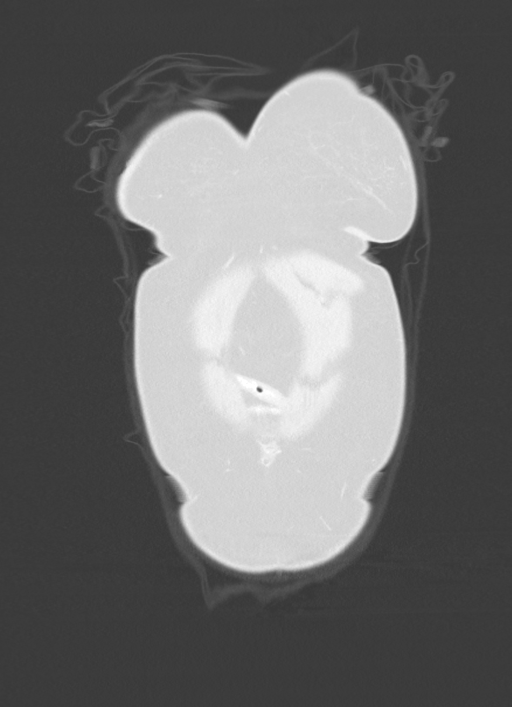
[im 59/148  lung]
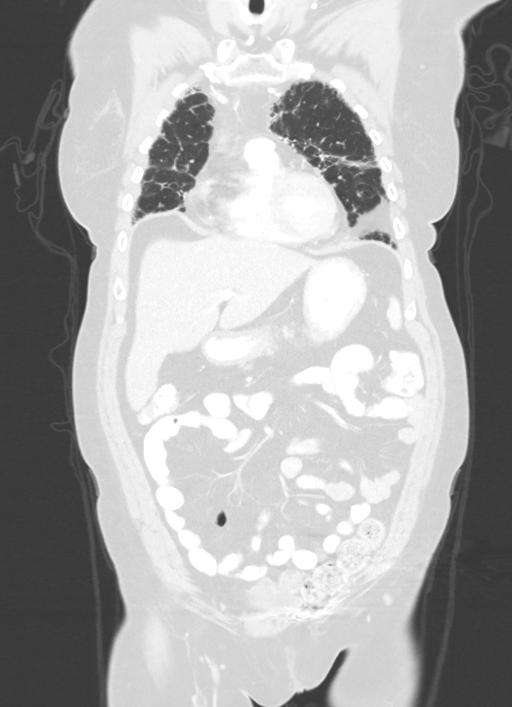
[im 89/148  lung]
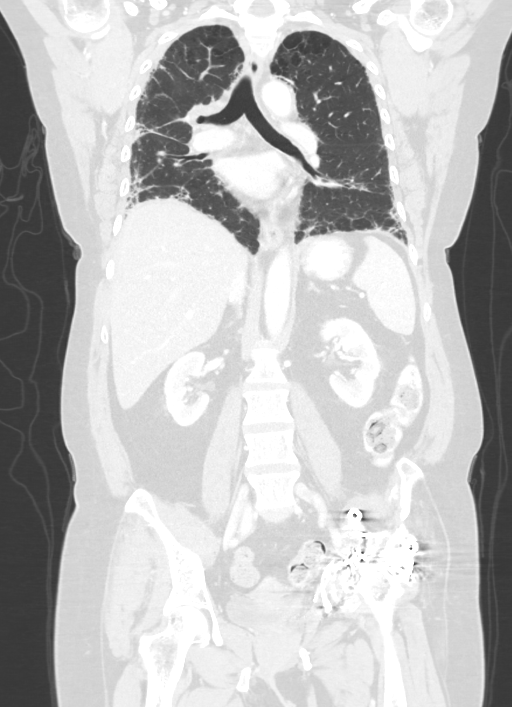

[12 of 36 positions shown; findings below may reference images not displayed]

FINDINGS: CT CHEST FINDINGS

Cardiovascular: The heart is normal in size. No pericardial
effusion.

No evidence of thoracic aortic aneurysm. Mild atherosclerotic
calcifications of the aortic arch.

Left chest port terminates at the cavoatrial junction.

Mediastinum/Nodes: No suspicious mediastinal or axillary
lymphadenopathy. Prior mediastinal lymphadenopathy on prior PET has
resolved. 11 mm short axis node in the right hilar region (series
2/image 21), previously 13 mm on recent CT.

Visualized thyroid is unremarkable.

Lungs/Pleura: Prior medial right upper lobe/suprahilar mass has
essentially resolved.

Additional subpleural nodule in the lateral right lung apex
measuring 1.3 x 2.0 cm (series 4/image 29), grossly unchanged from
recent study but previously 1.5 x 3.0 cm in January 2019.

Subpleural reticulation/fibrosis with honeycombing in the lungs
bilaterally. Mild lower lobe predominance. This appearance favors
chronic interstitial lung disease, favoring fibrotic NSIP over a
true UIP pattern.

No focal consolidation.

No pleural effusion or pneumothorax.

Musculoskeletal: Visualized osseous structures are within normal
limits.

CT ABDOMEN PELVIS FINDINGS

Hepatobiliary: Liver is within normal limits. No
suspicious/enhancing hepatic lesions.

Status post cholecystectomy. No intrahepatic or extrahepatic ductal
dilatation.

Pancreas: Within normal limits.

Spleen: Within normal limits.

Adrenals/Urinary Tract: Adrenal glands are within normal limits.

Kidneys are within normal limits. No hydronephrosis.

Bladder is within normal limits.

Stomach/Bowel: Stomach is within normal limits.

No evidence of bowel obstruction.

Normal appendix (series 2/image 80).

Mild left colonic stool burden.

Vascular/Lymphatic: No evidence of abdominal aortic aneurysm.

Atherosclerotic calcifications of the abdominal aorta and branch
vessels. IVC filter.

No suspicious abdominopelvic lymphadenopathy.

Reproductive: Uterus is within normal limits.

Bilateral ovaries are unremarkable.

Other: No abdominopelvic ascites.

Musculoskeletal: Lumbar spine is within normal limits.

Status post ORIF of the left pelvis/acetabulum with deformity of the
left hip/femoral head with associated foreshortening and
degenerative changes. Old bilateral superior and inferior pubic ring
fractures.
IMPRESSION: Prior medial right upper lobe/suprahilar mass has resolved. Residual
11 mm short axis right hilar node, improved.

Additional subpleural nodule in the lateral right lung apex measures
1.3 x 2.0 cm, improved from January 2019.

No findings suspicious for metastatic disease in the abdomen/pelvis.

Additional ancillary findings as above.
